# Patient Record
Sex: Female | Born: 1990 | Race: Black or African American | Hispanic: No | Marital: Single | Smoking: Current every day smoker
Health system: Southern US, Community
[De-identification: ages and names within clinical notes are randomized; demographics above are authoritative.]

## PROBLEM LIST (undated history)

## (undated) ENCOUNTER — Inpatient Hospital Stay (HOSPITAL_COMMUNITY): Payer: Self-pay

## (undated) DIAGNOSIS — S82402A Unspecified fracture of shaft of left fibula, initial encounter for closed fracture: Secondary | ICD-10-CM

## (undated) DIAGNOSIS — E669 Obesity, unspecified: Secondary | ICD-10-CM

## (undated) DIAGNOSIS — J45909 Unspecified asthma, uncomplicated: Secondary | ICD-10-CM

## (undated) DIAGNOSIS — F32A Depression, unspecified: Secondary | ICD-10-CM

## (undated) DIAGNOSIS — F419 Anxiety disorder, unspecified: Secondary | ICD-10-CM

## (undated) DIAGNOSIS — K219 Gastro-esophageal reflux disease without esophagitis: Secondary | ICD-10-CM

## (undated) DIAGNOSIS — A749 Chlamydial infection, unspecified: Secondary | ICD-10-CM

## (undated) DIAGNOSIS — W44F3XA Food entering into or through a natural orifice, initial encounter: Secondary | ICD-10-CM

## (undated) DIAGNOSIS — K220 Achalasia of cardia: Secondary | ICD-10-CM

## (undated) DIAGNOSIS — F329 Major depressive disorder, single episode, unspecified: Secondary | ICD-10-CM

## (undated) DIAGNOSIS — T18128A Food in esophagus causing other injury, initial encounter: Secondary | ICD-10-CM

## (undated) HISTORY — DX: Food in esophagus causing other injury, initial encounter: T18.128A

## (undated) HISTORY — DX: Food entering into or through a natural orifice, initial encounter: W44.F3XA

## (undated) HISTORY — DX: Achalasia of cardia: K22.0

---
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
## 2010-11-29 NOTE — Op Note (Signed)
NAME:  Theresa [name], MAK NO.:  [id number]   MEDICAL RECORD NO.:  [id number]          PATIENT TYPE:  AMB   LOCATION:  DSC                          FACILITY:  [facility]   PHYSICIAN:  Christopher E. Ezzard Standing, M.D.DATE OF BIRTH:  September 20, 1990   DATE OF PROCEDURE:  08/06/2007  DATE OF DISCHARGE:                               OPERATIVE REPORT   PREOPERATIVE DIAGNOSIS:  Left tonsil mass with throat and swallowing  discomfort.   POSTOPERATIVE DIAGNOSIS:  Left tonsil mass with throat and swallowing  discomfort.   OPERATION:  Tonsillectomy.   SURGEON:  Dillard Cannon, M.D.   ANESTHESIA:  General endotracheal.   COMPLICATIONS:  None.   CLINICAL NOTE:  Gabriella Woodhead is an 20 year old female who has had a  mass on the left tonsil that has gradually been getting larger over the  last six months.  She has had intermittent discomfort with this.  On  exam, she has what appears to be a large left tonsillar cyst with the  mass extruding to midline level of the uvula.  She is taken to the  operating room at this time for tonsillectomy.   DESCRIPTION OF PROCEDURE:  After adequate endotracheal anesthesia, the  patient received 1 gram Ancef IV preoperatively as well as 10 mg of  Decadron.  A mouth gag was used to expose the oropharynx.  The left  tonsil was grasped with a tenaculum, was excised from the tonsillar  fossa using the cautery.  Care was taken to preserve the anterior and  posterior tonsillar pillars as well as the uvula.  Hemostasis was  obtained with the cautery.  The mass was sent as a specimen to  pathology.  Next the right tonsil was removed from the tonsillar fossa  in similar fashion.  Hemostasis was obtained the with cautery.  This  likewise was sent to pathology.  After obtaining adequate hemostasis,  the oropharynx was irrigated with saline.  Valentina was awoken from  anesthesia and transferred to the recovery room postop doing well.   DISPOSITION:  Aolani is  discharged home later this morning on  amoxicillin suspension 400 mg b.i.d. for 1 week and Tylenol Lortab  elixir one to one and one-half tablespoons q.4h. p.r.n. pain.  Follow-up  in my office in 2 weeks for recheck.           ______________________________  Kristine Garbe. Ezzard Standing, M.D.    CEN/MEDQ  D:  08/06/2007  T:  08/06/2007  Job:  [id number]   cc:   Renaye Rakers, M.D.  Kristine Garbe. Ezzard Standing, M.D.

## 2011-04-07 LAB — POCT HEMOGLOBIN-HEMACUE: Hemoglobin: 13.1

## 2011-12-16 DIAGNOSIS — S82402A Unspecified fracture of shaft of left fibula, initial encounter for closed fracture: Secondary | ICD-10-CM

## 2011-12-16 HISTORY — DX: Unspecified fracture of shaft of left fibula, initial encounter for closed fracture: S82.402A

## 2012-01-13 ENCOUNTER — Emergency Department (HOSPITAL_COMMUNITY)
Admission: EM | Admit: 2012-01-13 | Discharge: 2012-01-13 | Disposition: A | Discharge status: Home / Self Care | Payer: Medicaid - Out of State | Attending: Emergency Medicine | Admitting: Emergency Medicine

## 2012-01-13 ENCOUNTER — Encounter (HOSPITAL_COMMUNITY): Payer: Self-pay | Admitting: *Deleted

## 2012-01-13 ENCOUNTER — Emergency Department (HOSPITAL_COMMUNITY): Payer: Medicaid - Out of State

## 2012-01-13 DIAGNOSIS — J45909 Unspecified asthma, uncomplicated: Secondary | ICD-10-CM | POA: Insufficient documentation

## 2012-01-13 DIAGNOSIS — Y9229 Other specified public building as the place of occurrence of the external cause: Secondary | ICD-10-CM | POA: Insufficient documentation

## 2012-01-13 DIAGNOSIS — F17200 Nicotine dependence, unspecified, uncomplicated: Secondary | ICD-10-CM | POA: Insufficient documentation

## 2012-01-13 DIAGNOSIS — S82839A Other fracture of upper and lower end of unspecified fibula, initial encounter for closed fracture: Secondary | ICD-10-CM

## 2012-01-13 DIAGNOSIS — W010XXA Fall on same level from slipping, tripping and stumbling without subsequent striking against object, initial encounter: Secondary | ICD-10-CM | POA: Insufficient documentation

## 2012-01-13 DIAGNOSIS — S82899A Other fracture of unspecified lower leg, initial encounter for closed fracture: Secondary | ICD-10-CM | POA: Insufficient documentation

## 2012-01-13 HISTORY — DX: Unspecified asthma, uncomplicated: J45.909

## 2012-01-13 MED ORDER — HYDROCODONE-ACETAMINOPHEN 5-325 MG PO TABS
1.0000 | ORAL_TABLET | Freq: Once | ORAL | Status: AC
  Administered 2012-01-13: 1 via ORAL
  Filled 2012-01-13: qty 1

## 2012-01-13 MED ORDER — NAPROXEN 500 MG PO TABS: 500.0000 mg | ORAL_TABLET | Freq: Two times a day (BID) | ORAL | Status: AC

## 2012-01-13 MED ORDER — HYDROCODONE-ACETAMINOPHEN 5-325 MG PO TABS: 1.0000 | ORAL_TABLET | Freq: Four times a day (QID) | ORAL | Status: AC | PRN

## 2012-01-13 NOTE — ED Notes (Signed)
Patient discharge via wheelchair with crutches. Respirations equal and unlabored. Skin warm and dry. No acute distress noted.

## 2012-01-13 NOTE — ED Notes (Signed)
Pt from hotel with reports of tripping over carpet and falling injuring left ankle which is noted to be swollen and bruised. Pt denies LOC.

## 2012-01-13 NOTE — ED Provider Notes (Signed)
History     CSN: [id number]  Arrival date & time 01/13/12  1554   First MD Initiated Contact with Patient 01/13/12 1823      Chief Complaint  Patient presents with  . Fall  . Ankle Injury    left  . Ankle Pain    left    Patient is a 21 y.o. female presenting with fall. The history is provided by the patient.  Fall Incident onset: today. Incident: Pt from hotel with reports of tripping over carpet and falling injuring left ankle. Pain location: left ankle. The pain is moderate. Pertinent negatives include no fever. The symptoms are aggravated by activity and ambulation. She has tried nothing for the symptoms. The treatment provided no relief.  Pt has pain on the outside of her left ankle.  No foot or knee pain.  Past Medical History  Diagnosis Date  . Asthma     Past Surgical History  Procedure Date  . Cesarean section     History reviewed. No pertinent family history.  History  Substance Use Topics  . Smoking status: Current Everyday Smoker -- 0.5 packs/day    Types: Cigarettes  . Smokeless tobacco: Never Used  . Alcohol Use: Yes     weekends    OB History    Grav Para Term Preterm Abortions TAB SAB Ect Mult Living                  Review of Systems  Constitutional: Negative for fever.  Respiratory: Negative for shortness of breath.   Genitourinary: Negative for frequency.    Allergies  Review of patient's allergies indicates no known allergies.  Home Medications   Current Outpatient Rx  Name Route Sig Dispense Refill  . HYDROCODONE-ACETAMINOPHEN 5-325 MG PO TABS Oral Take 1-2 tablets by mouth every 6 (six) hours as needed for pain. 16 tablet 0  . NAPROXEN 500 MG PO TABS Oral Take 1 tablet (500 mg total) by mouth 2 (two) times daily. 30 tablet 0    BP 102/60  Pulse 104  Temp 99 F (37.2 C) (Oral)  Resp 18  Ht 5\' 4"  (1.626 m)  Wt 200 lb (90.719 kg)  BMI 34.33 kg/m2  SpO2 99%  LMP 01/01/2012  Physical Exam  Nursing note and vitals  reviewed. Constitutional: She appears well-developed and well-nourished. No distress.  HENT:  Head: Normocephalic and atraumatic.  Right Ear: External ear normal.  Left Ear: External ear normal.  Eyes: Conjunctivae are normal. Right eye exhibits no discharge. Left eye exhibits no discharge. No scleral icterus.  Neck: Neck supple. No tracheal deviation present.  Cardiovascular: Normal rate.   Pulmonary/Chest: Effort normal. No stridor. No respiratory distress.  Musculoskeletal: She exhibits no edema.       Left knee: Normal.       Left ankle: She exhibits decreased range of motion and swelling. She exhibits no deformity, no laceration and normal pulse. tenderness. Lateral malleolus tenderness found. No medial malleolus, no head of 5th metatarsal and no proximal fibula tenderness found. Achilles tendon normal.  Neurological: She is alert. Cranial nerve deficit: no gross deficits.  Skin: Skin is warm and dry. No rash noted.  Psychiatric: She has a normal mood and affect.    ED Course  Procedures (including critical care time)  Labs Reviewed - No data to display Dg Ankle Complete Left  01/13/2012  *RADIOLOGY REPORT*  Clinical Data: Status post fall  LEFT ANKLE COMPLETE - 3+ VIEW  Comparison: None  Findings:  There is an oblique fracture deformity involving the distal fibula.  Fracture line extends into the ankle joint.  No significant displacement or angulation.  No radiopaque foreign bodies or soft tissue calcifications.  Marked lateral soft tissue swelling is noted.  IMPRESSION:  1.  Acute, oblique, intra-articular fracture involving the distal fibula.  Original Report Authenticated By: Rosealee Albee, M.D.     1. Fracture of distal fibula       MDM  Pt placed in cam walker.  Crutches .  Will dc home with ortho referral.        Celene Kras, MD 01/13/12 [phone]

## 2012-01-13 NOTE — Discharge Instructions (Signed)
Ankle Fracture A fracture is a break in the bone. A cast or splint is used to protect and keep your injured bone from moving.  HOME CARE INSTRUCTIONS   Use your crutches as directed.   To lessen the swelling, keep the injured leg elevated while sitting or lying down.   Apply ice to the injury for 15 to 20 minutes, 3 to 4 times per day while awake for 2 days. Put the ice in a plastic bag and place a thin towel between the bag of ice and your cast.   If you have a plaster or fiberglass cast:   Do not try to scratch the skin under the cast using sharp or pointed objects.   Check the skin around the cast every day. You may put lotion on any red or sore areas.   Keep your cast dry and clean.   If you have a plaster splint:   Wear the splint as directed.   You may loosen the elastic around the splint if your toes become numb, tingle, or turn cold or blue.   Do not put pressure on any part of your cast or splint; it may break. Rest your cast only on a pillow the first 24 hours until it is fully hardened.   Your cast or splint can be protected during bathing with a plastic bag. Do not lower the cast or splint into water.   Take medications as directed by your caregiver. Only take over-the-counter or prescription medicines for pain, discomfort, or fever as directed by your caregiver.   Do not drive a vehicle until your caregiver specifically tells you it is safe to do so.   If your caregiver has given you a follow-up appointment, it is very important to keep that appointment. Not keeping the appointment could result in a chronic or permanent injury, pain, and disability. If there is any problem keeping the appointment, you must call back to this facility for assistance.  SEEK IMMEDIATE MEDICAL CARE IF:   Your cast gets damaged or breaks.   You have continued severe pain or more swelling than you did before the cast was put on.   Your skin or toenails below the injury turn blue or gray,  or feel cold or numb.   There is a bad smell or new stains and/or purulent (pus like) drainage coming from under the cast.  If you do not have a window in your cast for observing the wound, a discharge or minor bleeding may show up as a stain on the outside of your cast. Report these findings to your caregiver. MAKE SURE YOU:   Understand these instructions.   Will watch your condition.   Will get help right away if you are not doing well or get worse.  Document Released: 06/30/2000 Document Revised: 06/22/2011 Document Reviewed: 02/04/2008 [employer] Patient Information 2012 [employer]. 

## 2013-04-25 ENCOUNTER — Emergency Department (HOSPITAL_COMMUNITY)
Admission: EM | Admit: 2013-04-25 | Discharge: 2013-04-25 | Disposition: A | Discharge status: Home / Self Care | Payer: Medicaid - Out of State | Attending: Emergency Medicine | Admitting: Emergency Medicine

## 2013-04-25 ENCOUNTER — Encounter (HOSPITAL_COMMUNITY): Payer: Self-pay | Admitting: Emergency Medicine

## 2013-04-25 DIAGNOSIS — N390 Urinary tract infection, site not specified: Secondary | ICD-10-CM

## 2013-04-25 DIAGNOSIS — F17200 Nicotine dependence, unspecified, uncomplicated: Secondary | ICD-10-CM | POA: Insufficient documentation

## 2013-04-25 DIAGNOSIS — Z3202 Encounter for pregnancy test, result negative: Secondary | ICD-10-CM | POA: Insufficient documentation

## 2013-04-25 DIAGNOSIS — R3989 Other symptoms and signs involving the genitourinary system: Secondary | ICD-10-CM | POA: Insufficient documentation

## 2013-04-25 DIAGNOSIS — J45909 Unspecified asthma, uncomplicated: Secondary | ICD-10-CM | POA: Insufficient documentation

## 2013-04-25 DIAGNOSIS — N938 Other specified abnormal uterine and vaginal bleeding: Secondary | ICD-10-CM | POA: Insufficient documentation

## 2013-04-25 DIAGNOSIS — Z792 Long term (current) use of antibiotics: Secondary | ICD-10-CM | POA: Insufficient documentation

## 2013-04-25 DIAGNOSIS — N949 Unspecified condition associated with female genital organs and menstrual cycle: Secondary | ICD-10-CM | POA: Insufficient documentation

## 2013-04-25 LAB — URINALYSIS, ROUTINE W REFLEX MICROSCOPIC
Ketones, ur: 15 mg/dL — AB
Nitrite: POSITIVE — AB
Protein, ur: NEGATIVE mg/dL

## 2013-04-25 LAB — URINE MICROSCOPIC-ADD ON

## 2013-04-25 MED ORDER — FLUCONAZOLE 150 MG PO TABS: ORAL_TABLET | ORAL | Status: DC

## 2013-04-25 MED ORDER — CEPHALEXIN 500 MG PO CAPS: 500.0000 mg | ORAL_CAPSULE | Freq: Two times a day (BID) | ORAL | Status: DC

## 2013-04-25 NOTE — ED Provider Notes (Signed)
CSN: [id number]     Arrival date & time 04/25/13  1257 History   First MD Initiated Contact with Patient 04/25/13 1344     Chief Complaint  Patient presents with  . Abdominal Pain   (Consider location/radiation/quality/duration/timing/severity/associated sxs/prior Treatment) Patient is a 22 y.o. female presenting with abdominal pain.  Abdominal Pain Associated symptoms: no chest pain, no chills, no fever and no shortness of breath   Patient is a 22 yo female who presents with suprapubic abdominal pain for the past several days in addition to 10 days of menstrual period. States starting 10/1 she started having her period. Has been bright red blood. Initially was using 4 tampons/day. It started to slow on 10/8. She states she typically has regular periods that last 3-4 days and uses 2 tampons per day. She notes suprapubic pain and states this feels like a UTI to her. Endorses tingling with urination and has noted suprapubic pain following intercourse over the past couple days. Notes history of chlamydia at age 66. Is monogamous with her boyfriend. Does not note discharge.  Past Medical History  Diagnosis Date  . Asthma    Past Surgical History  Procedure Laterality Date  . Cesarean section     History reviewed. No pertinent family history. History  Substance Use Topics  . Smoking status: Current Every Day Smoker -- 0.50 packs/day    Types: Cigarettes  . Smokeless tobacco: Never Used  . Alcohol Use: Yes     Comment: weekends   OB History   Grav Para Term Preterm Abortions TAB SAB Ect Mult Living                 Review of Systems  Constitutional: Negative for fever and chills.  Eyes: Negative for visual disturbance.  Respiratory: Negative for chest tightness and shortness of breath.   Cardiovascular: Negative for chest pain and leg swelling.  Gastrointestinal: Positive for abdominal pain (suprapubic).  Genitourinary: Positive for menstrual problem (10 days of bleeding, slowing  at this time). Negative for urgency, difficulty urinating, vaginal pain and pelvic pain.       Tingling with urination  Musculoskeletal: Negative for joint swelling.  All other systems reviewed and are negative.    Allergies  Review of patient's allergies indicates no known allergies.  Home Medications   Current Outpatient Rx  Name  Route  Sig  Dispense  Refill  . cephALEXin (KEFLEX) 500 MG capsule   Oral   Take 1 capsule (500 mg total) by mouth 2 (two) times daily.   14 capsule   0   . fluconazole (DIFLUCAN) 150 MG tablet      One tablet by mouth at first sign of a yeast infection. May repeat after 72 hours for one additional dose.   2 tablet   0    BP 124/65  Pulse 76  Temp(Src) 98.7 F (37.1 C) (Oral)  Resp 18  Ht 5\' 4"  (1.626 m)  Wt 199 lb 6.4 oz (90.447 kg)  BMI 34.21 kg/m2  SpO2 97% Physical Exam  Constitutional: She appears well-developed and well-nourished. No distress.  HENT:  Head: Normocephalic and atraumatic.  Mouth/Throat: Oropharynx is clear and moist.  Eyes: Conjunctivae are normal. Pupils are equal, round, and reactive to light.  Neck: Neck supple.  Cardiovascular: Normal rate and regular rhythm.   Pulmonary/Chest: Effort normal and breath sounds normal.  Abdominal: Soft. Bowel sounds are normal. She exhibits no distension. There is tenderness (suprapubic). There is no rebound and no guarding.  Genitourinary:  External vagina normal in appearance, bimanual exam without masses or adnexal/cervical tenderness, no blood noted on gloves after bimanual, mirena strings felt on bimanual exam    ED Course  Procedures (including critical care time) Labs Review Labs Reviewed  URINALYSIS, ROUTINE W REFLEX MICROSCOPIC - Abnormal; Notable for the following:    APPearance CLOUDY (*)    Specific Gravity, Urine 1.031 (*)    Ketones, ur 15 (*)    Nitrite POSITIVE (*)    Leukocytes, UA MODERATE (*)    All other components within normal limits  URINE  MICROSCOPIC-ADD ON - Abnormal; Notable for the following:    Squamous Epithelial / LPF FEW (*)    Bacteria, UA MANY (*)    All other components within normal limits  URINE CULTURE  POCT PREGNANCY, URINE   Imaging Review No results found.  EKG Interpretation   None       MDM   1. UTI (lower urinary tract infection)    Patient with suprapubic abdominal pain worse with urination. Additionally with prolonged (10 days) menstrual bleeding in the setting of mirena IUD. UA revealed + nitrites and moderate leuks. Bimanual exam did not reveal any adnexal or cervical motion tenderness, making the UTI the likely cause of her suprapubic abdominal pain. Her prolonged menstrual bleeding has been improving for the past 2 days and is likely related to her mirena IUD. I discussed this with the patient and she acknowledged understanding of this. Advised that if menstrual irregularities continue she should follow-up with a PCP. She was given a prescription for keflex 500 mg BID for 7 days and diflucan 150 mg once with option to repeat after 72 hours. Given return precautions and given resource guide for PCP follow-up. I discussed this patient with my attending Dr Radford Pax.  Marikay Alar, MD [facility] PGY-2 04/25/13 2:50 pm    Glori Luis, MD 04/25/13 1500

## 2013-04-25 NOTE — ED Notes (Addendum)
GYN. Vaginal bleeding since 10/1 (bright red, small). Prior periods normal. Uterine implant (Mirena) in place since 04/2012. Hx of previous implants. PT endorses "pinching feeling after intercourse and movement". Urinary frequency, no other urinary Sx. BM normal. NO other vaginal discharge

## 2013-04-26 NOTE — ED Provider Notes (Signed)
I saw and evaluated the patient, reviewed the resident's note and I agree with the findings and plan.   .Face to face Exam:  General:  Awake HEENT:  Atraumatic Resp:  Normal effort Abd:  Nondistended Neuro:No focal weakness  Nelia Shi, MD 04/26/13 1118

## 2013-04-27 LAB — URINE CULTURE: Colony Count: >=100000

## 2013-07-17 DIAGNOSIS — E669 Obesity, unspecified: Secondary | ICD-10-CM | POA: Insufficient documentation

## 2013-07-17 HISTORY — DX: Obesity, unspecified: E66.9

## 2013-08-05 ENCOUNTER — Encounter (HOSPITAL_COMMUNITY): Payer: Self-pay | Admitting: Emergency Medicine

## 2013-08-05 DIAGNOSIS — J45909 Unspecified asthma, uncomplicated: Secondary | ICD-10-CM | POA: Diagnosis present

## 2013-08-05 DIAGNOSIS — E876 Hypokalemia: Secondary | ICD-10-CM | POA: Diagnosis present

## 2013-08-05 DIAGNOSIS — E43 Unspecified severe protein-calorie malnutrition: Secondary | ICD-10-CM | POA: Diagnosis present

## 2013-08-05 DIAGNOSIS — K219 Gastro-esophageal reflux disease without esophagitis: Secondary | ICD-10-CM | POA: Diagnosis present

## 2013-08-05 DIAGNOSIS — F17200 Nicotine dependence, unspecified, uncomplicated: Secondary | ICD-10-CM | POA: Diagnosis present

## 2013-08-05 DIAGNOSIS — K220 Achalasia of cardia: Secondary | ICD-10-CM | POA: Diagnosis present

## 2013-08-05 DIAGNOSIS — R1310 Dysphagia, unspecified: Principal | ICD-10-CM | POA: Diagnosis present

## 2013-08-05 DIAGNOSIS — E860 Dehydration: Secondary | ICD-10-CM | POA: Diagnosis present

## 2013-08-05 DIAGNOSIS — Z6830 Body mass index (BMI) 30.0-30.9, adult: Secondary | ICD-10-CM

## 2013-08-05 LAB — CBC
HEMATOCRIT: 42.5 % (ref 36.0–46.0)
Hemoglobin: 14.8 g/dL (ref 12.0–15.0)
MCH: 30.6 pg (ref 26.0–34.0)
MCHC: 34.8 g/dL (ref 30.0–36.0)
MCV: 87.8 fL (ref 78.0–100.0)
PLATELETS: 212 10*3/uL (ref 150–400)
RBC: 4.84 MIL/uL (ref 3.87–5.11)
RDW: 12.9 % (ref 11.5–15.5)
WBC: 7.9 10*3/uL (ref 4.0–10.5)

## 2013-08-05 LAB — BASIC METABOLIC PANEL
BUN: 7 mg/dL (ref 6–23)
CHLORIDE: 105 meq/L (ref 96–112)
CO2: 22 meq/L (ref 19–32)
Calcium: 9.3 mg/dL (ref 8.4–10.5)
Creatinine, Ser: 0.73 mg/dL (ref 0.50–1.10)
GFR calc non Af Amer: >90 mL/min (ref >90)
Glucose, Bld: 96 mg/dL (ref 70–99)
POTASSIUM: 4.3 meq/L (ref 3.7–5.3)
Sodium: 142 mEq/L (ref 137–147)

## 2013-08-05 LAB — POCT PREGNANCY, URINE: PREG TEST UR: NEGATIVE

## 2013-08-05 MED ORDER — ONDANSETRON 4 MG PO TBDP
4.0000 mg | ORAL_TABLET | Freq: Once | ORAL | Status: AC
  Administered 2013-08-05: 4 mg via ORAL
  Filled 2013-08-05: qty 1

## 2013-08-05 NOTE — ED Notes (Signed)
Pt is here with discomfort to mid epigastric pain and states that when she eats it comes up.  Pt here to r/o reflux

## 2013-08-06 ENCOUNTER — Observation Stay (HOSPITAL_COMMUNITY): Payer: Medicaid - Out of State

## 2013-08-06 ENCOUNTER — Encounter (HOSPITAL_COMMUNITY): Payer: Self-pay | Admitting: Internal Medicine

## 2013-08-06 ENCOUNTER — Encounter (HOSPITAL_COMMUNITY)
Admission: EM | Disposition: A | Discharge status: Home / Self Care | Payer: Self-pay | Source: Home / Self Care | Attending: Internal Medicine

## 2013-08-06 ENCOUNTER — Inpatient Hospital Stay (HOSPITAL_COMMUNITY)
Admission: EM | Admit: 2013-08-06 | Discharge: 2013-08-07 | DRG: 391 | Disposition: A | Discharge status: Home / Self Care | Payer: Medicaid - Out of State | Attending: Internal Medicine | Admitting: Internal Medicine

## 2013-08-06 DIAGNOSIS — F17200 Nicotine dependence, unspecified, uncomplicated: Secondary | ICD-10-CM

## 2013-08-06 DIAGNOSIS — Z720 Tobacco use: Secondary | ICD-10-CM | POA: Diagnosis present

## 2013-08-06 DIAGNOSIS — R1310 Dysphagia, unspecified: Principal | ICD-10-CM

## 2013-08-06 DIAGNOSIS — E876 Hypokalemia: Secondary | ICD-10-CM

## 2013-08-06 DIAGNOSIS — E43 Unspecified severe protein-calorie malnutrition: Secondary | ICD-10-CM

## 2013-08-06 DIAGNOSIS — R1110 Vomiting, unspecified: Secondary | ICD-10-CM | POA: Diagnosis present

## 2013-08-06 DIAGNOSIS — R1013 Epigastric pain: Secondary | ICD-10-CM

## 2013-08-06 DIAGNOSIS — K220 Achalasia of cardia: Secondary | ICD-10-CM

## 2013-08-06 HISTORY — DX: Obesity, unspecified: E66.9

## 2013-08-06 HISTORY — DX: Chlamydial infection, unspecified: A74.9

## 2013-08-06 HISTORY — DX: Unspecified fracture of shaft of left fibula, initial encounter for closed fracture: S82.402A

## 2013-08-06 HISTORY — PX: ESOPHAGOGASTRODUODENOSCOPY (EGD) WITH ESOPHAGEAL DILATION: SHX5812

## 2013-08-06 LAB — GLUCOSE, CAPILLARY
GLUCOSE-CAPILLARY: 90 mg/dL (ref 70–99)
GLUCOSE-CAPILLARY: 61 mg/dL — AB (ref 70–99)
GLUCOSE-CAPILLARY: 112 mg/dL — AB (ref 70–99)
Glucose-Capillary: 105 mg/dL — ABNORMAL HIGH (ref 70–99)
Glucose-Capillary: 66 mg/dL — ABNORMAL LOW (ref 70–99)
Glucose-Capillary: 80 mg/dL (ref 70–99)

## 2013-08-06 SURGERY — EGD (ESOPHAGOGASTRODUODENOSCOPY)
Anesthesia: Moderate Sedation

## 2013-08-06 SURGERY — ESOPHAGOGASTRODUODENOSCOPY (EGD) WITH ESOPHAGEAL DILATION
Anesthesia: Moderate Sedation

## 2013-08-06 MED ORDER — DEXTROSE-NACL 5-0.9 % IV SOLN
INTRAVENOUS | Status: DC
  Administered 2013-08-06 – 2013-08-07 (×2): via INTRAVENOUS

## 2013-08-06 MED ORDER — PANTOPRAZOLE SODIUM 40 MG IV SOLR
40.0000 mg | INTRAVENOUS | Status: DC
  Administered 2013-08-06 – 2013-08-07 (×2): 40 mg via INTRAVENOUS
  Filled 2013-08-06 (×3): qty 40

## 2013-08-06 MED ORDER — PANTOPRAZOLE SODIUM 40 MG IV SOLR
40.0000 mg | Freq: Once | INTRAVENOUS | Status: AC
  Administered 2013-08-06: 40 mg via INTRAVENOUS
  Filled 2013-08-06: qty 40

## 2013-08-06 MED ORDER — DIPHENHYDRAMINE HCL 50 MG/ML IJ SOLN
INTRAMUSCULAR | Status: DC | PRN
  Administered 2013-08-06: 25 mg via INTRAVENOUS

## 2013-08-06 MED ORDER — WHITE PETROLATUM GEL
Status: AC
  Filled 2013-08-06: qty 5

## 2013-08-06 MED ORDER — MIDAZOLAM HCL 5 MG/ML IJ SOLN
INTRAMUSCULAR | Status: AC
  Filled 2013-08-06: qty 2

## 2013-08-06 MED ORDER — MIDAZOLAM HCL 10 MG/2ML IJ SOLN
INTRAMUSCULAR | Status: DC | PRN
  Administered 2013-08-06 (×2): 2 mg via INTRAVENOUS
  Administered 2013-08-06: 1 mg via INTRAVENOUS
  Administered 2013-08-06: 2 mg via INTRAVENOUS

## 2013-08-06 MED ORDER — ZOLPIDEM TARTRATE 5 MG PO TABS
5.0000 mg | ORAL_TABLET | Freq: Once | ORAL | Status: AC
  Administered 2013-08-06: 5 mg via ORAL
  Filled 2013-08-06: qty 1

## 2013-08-06 MED ORDER — SODIUM CHLORIDE 0.9 % IV SOLN: INTRAVENOUS | Status: DC

## 2013-08-06 MED ORDER — DEXTROSE 50 % IV SOLN
INTRAVENOUS | Status: AC
  Filled 2013-08-06: qty 50

## 2013-08-06 MED ORDER — ACETAMINOPHEN 325 MG PO TABS
650.0000 mg | ORAL_TABLET | Freq: Four times a day (QID) | ORAL | Status: DC | PRN
  Administered 2013-08-07: 650 mg via ORAL
  Filled 2013-08-06: qty 2

## 2013-08-06 MED ORDER — FENTANYL CITRATE 0.05 MG/ML IJ SOLN
INTRAMUSCULAR | Status: DC | PRN
  Administered 2013-08-06 (×4): 25 ug via INTRAVENOUS

## 2013-08-06 MED ORDER — ONDANSETRON HCL 4 MG PO TABS: 4.0000 mg | ORAL_TABLET | Freq: Four times a day (QID) | ORAL | Status: DC | PRN

## 2013-08-06 MED ORDER — ONDANSETRON HCL 4 MG/2ML IJ SOLN: 4.0000 mg | Freq: Three times a day (TID) | INTRAMUSCULAR | Status: DC | PRN

## 2013-08-06 MED ORDER — FAMOTIDINE 20 MG PO TABS
20.0000 mg | ORAL_TABLET | Freq: Once | ORAL | Status: AC
  Administered 2013-08-06: 20 mg via ORAL
  Filled 2013-08-06: qty 1

## 2013-08-06 MED ORDER — DEXTROSE 50 % IV SOLN
25.0000 mL | Freq: Once | INTRAVENOUS | Status: AC | PRN
  Administered 2013-08-06: 25 mL via INTRAVENOUS

## 2013-08-06 MED ORDER — FENTANYL CITRATE 0.05 MG/ML IJ SOLN
INTRAMUSCULAR | Status: AC
  Filled 2013-08-06: qty 2

## 2013-08-06 MED ORDER — ACETAMINOPHEN 650 MG RE SUPP: 650.0000 mg | Freq: Four times a day (QID) | RECTAL | Status: DC | PRN

## 2013-08-06 MED ORDER — GI COCKTAIL ~~LOC~~
30.0000 mL | Freq: Once | ORAL | Status: AC
  Administered 2013-08-06: 30 mL via ORAL
  Filled 2013-08-06: qty 30

## 2013-08-06 MED ORDER — NIFEDIPINE 10 MG PO CAPS
10.0000 mg | ORAL_CAPSULE | Freq: Three times a day (TID) | ORAL | Status: DC
  Administered 2013-08-06 – 2013-08-07 (×3): 10 mg via ORAL
  Filled 2013-08-06 (×6): qty 1

## 2013-08-06 MED ORDER — SODIUM CHLORIDE 0.9 % IV BOLUS (SEPSIS)
1000.0000 mL | Freq: Once | INTRAVENOUS | Status: AC
  Administered 2013-08-06: 1000 mL via INTRAVENOUS

## 2013-08-06 MED ORDER — SODIUM CHLORIDE 0.9 % IV SOLN
INTRAVENOUS | Status: DC
  Administered 2013-08-06: 16:00:00 via INTRAVENOUS

## 2013-08-06 MED ORDER — DIPHENHYDRAMINE HCL 50 MG/ML IJ SOLN
INTRAMUSCULAR | Status: AC
Start: 2013-08-06 — End: 2013-08-06
  Filled 2013-08-06: qty 1

## 2013-08-06 MED ORDER — ONDANSETRON HCL 4 MG/2ML IJ SOLN
4.0000 mg | Freq: Four times a day (QID) | INTRAMUSCULAR | Status: DC | PRN
Start: 2013-08-06 — End: 2013-08-07

## 2013-08-06 MED ORDER — BUTAMBEN-TETRACAINE-BENZOCAINE 2-2-14 % EX AERO
INHALATION_SPRAY | CUTANEOUS | Status: DC | PRN
  Administered 2013-08-06: 2 via TOPICAL

## 2013-08-06 MED ORDER — SODIUM CHLORIDE 0.9 % IV SOLN
INTRAVENOUS | Status: DC
  Administered 2013-08-06: 06:00:00 via INTRAVENOUS

## 2013-08-06 NOTE — ED Notes (Signed)
MD at BS

## 2013-08-06 NOTE — H&P (Signed)
[facility] History and Physical  Theresa HerbertQuashay [name] ZOX:[id number] DOB: Dec 21, 1990 DOA: 08/06/2013  Referring physician: ER physician. PCP: Provider Not In System   Chief Complaint: Difficulty swallowing.  HPI: Theresa [name] is a 23 y.o. female history of tobacco abuse presented to the ER because of increasing pain on swallowing. Patient has been having gradually worsening pain on swallowing over the last 4 months with 15 pound weight loss. Patient initially had difficulty swallowing solid foods and last few weeks has been having difficulty both solid and liquids. Patient states that after she eats she feels her food gets stuck in the chest pain and she eventually has to vomit up. Denies any fever chills or blood in the vomitus he denies abdominal pain diarrhea. ER physician had discussed with on-call gastroenterologist and patient will be admitted for possible EGD and further workup. Patient otherwise denies any chest pain shortness of breath. Patient has been trying to take over-the-counter Prilosec last couple of weeks.  Review of Systems: As presented in the history of presenting illness, rest negative.  Past Medical History  Diagnosis Date  . Asthma    Past Surgical History  Procedure Laterality Date  . Cesarean section     Social History:  reports that she has been smoking Cigarettes.  She has been smoking about 0.50 packs per day. She has never used smokeless tobacco. She reports that she drinks alcohol. She reports that she does not use illicit drugs. Where does patient live home. Can patient participate in ADLs? Yes.  No Known Allergies  Family History:  Family History  Problem Relation Age of Onset  . Hypertension Other       Prior to Admission medications   Medication Sig Start Date End Date Taking? Authorizing Provider  omeprazole (PRILOSEC) 20 MG capsule Take 20 mg by mouth daily as needed (for acid reflux).   Yes Historical Provider, MD    Physical  Exam: Filed Vitals:   08/06/13 0200 08/06/13 0248 08/06/13 0440 08/06/13 0520  BP: 98/61 128/86 112/65 120/45  Pulse: 61 86 55 55  Temp:      TempSrc:      Resp: 24 26 18 17   SpO2: 100% 100% 99% 99%     General:  Well-developed and nourished.  Eyes: Anicteric no pallor.  ENT: No discharge from ears eyes nose mouth.  Neck: No mass felt.  Cardiovascular: S1-S2 heard.  Respiratory: No rhonchi or crepitations.  Abdomen: Soft nontender bowel sounds present.  Skin: No rash.  Musculoskeletal: No edema.  Psychiatric: Appears normal.  Neurologic: Alert awake oriented to time place and person. Moves all extremities.  Labs on Admission:  Basic Metabolic Panel:  Recent Labs Lab 08/05/13 1734  NA 142  K 4.3  CL 105  CO2 22  GLUCOSE 96  BUN 7  CREATININE 0.73  CALCIUM 9.3   Liver Function Tests: No results found for this basename: AST, ALT, ALKPHOS, BILITOT, PROT, ALBUMIN,  in the last 168 hours No results found for this basename: LIPASE, AMYLASE,  in the last 168 hours No results found for this basename: AMMONIA,  in the last 168 hours CBC:  Recent Labs Lab 08/05/13 1734  WBC 7.9  HGB 14.8  HCT 42.5  MCV 87.8  PLT 212   Cardiac Enzymes: No results found for this basename: CKTOTAL, CKMB, CKMBINDEX, TROPONINI,  in the last 168 hours  BNP (last 3 results) No results found for this basename: PROBNP,  in the last 8760 hours CBG: No results found  for this basename: GLUCAP,  in the last 168 hours  Radiological Exams on Admission: Dg Chest Port 1 View  08/06/2013   CLINICAL DATA:  Difficulty swallowing.  EXAM: PORTABLE CHEST - 1 VIEW  COMPARISON:  None.  FINDINGS: The lungs are well-aerated and clear. There is no evidence of focal opacification, pleural effusion or pneumothorax.  The cardiomediastinal silhouette is within normal limits. No acute osseous abnormalities are seen.  IMPRESSION: No acute cardiopulmonary process seen.   Electronically Signed   By:  Roanna Raider M.D.   On: 08/06/2013 05:19     Assessment/Plan Principal Problem:   Dysphagia Active Problems:   Vomiting   Tobacco abuse   1. Dysphagia - patient has dysphagia to both solids and liquids and has lost 14 pounds over the last 4 months. At this time in anticipation of possible procedure I have placed patient n.p.o. and place patient on Protonix IV. Check LFTs. On-call gastroenterologist at patient information as per the ER physician and we will follow further recommendations per gastroenterologist. Continue gentle hydration. 2. Tobacco abuse - patient advised to quit smoking.    Code Status: Full code.  Family Communication: None.  Disposition Plan: Admit for observation.    Theresa [name] N. [facility] Pager [phone].  If 7PM-7AM, please contact night-coverage [email or web address] Password [facility] 08/06/2013, 5:26 AM

## 2013-08-06 NOTE — ED Notes (Signed)
Pt states she feels dehydrated and very hungry however she is unable to eat or drink because it gets caught in her throat and she throws it up. Pt reports this has been ongoing for 3 months, pt states it has become progressively worse.

## 2013-08-06 NOTE — Progress Notes (Signed)
INITIAL NUTRITION ASSESSMENT  Pt meets criteria for SEVERE MALNUTRITION in the context of acute illness or injury as evidenced by energy intake </= 75% for >/= 1 month and weight loss >7.5% in 3 months.  DOCUMENTATION CODES Per approved criteria  -Severe malnutrition in the context of acute illness or injury -Obesity Unspecified   INTERVENTION: 1. Diet advancement at the discretion of the health care team.  2. Will monitor for need of oral nutrition supplements once diet advances.  3. Recommend monitoring potassium, magnesium, and phosphorous lab values as diet advances as pt is at potential risk for refeeding syndrome given very poor oral intake.  4. RD to continue to follow the nutrition care process.  NUTRITION DIAGNOSIS: Inadequate oral intake related to altered GI function as evidenced by dietary recall and weight loss.   Goal: Pt to meet >/= 90% of their estimated needs.  Monitor:  Weight trends, input/output  Reason for Assessment: Pt was identified as at nutrition risk by the malnutrition screening tool.  23 y.o. female  Admitting Dx: Dysphagia  ASSESSMENT: Pt has significant hx of tobacco abuse and was admitted for increase in pain on swallowing. Pt reports this pain has started in August 2014 and since then has been getting progressively worse where she would vomit mostly anything she would eat and sometimes what she would drink. Pt reports a feeling of food getting stuck in chest and then feeling of sickness and vomiting shortly after. She reports that after about 2 bites of food she would immediately feel sick and vomit. She reports that even smoothies, juice, and poweraid would cause her to vomit. Pt reports that vomit would be bile-like with no significant observation of food. Pt reports the feeling of sickness would worsen when laying down. Pt is unable to ate throughout the day due to not able to keep down any food and most liquids. Pt reports significant weight loss  from August 2014. Pt observed to have no significant signs of fat and muscle mass depletion.   Pt is currently NPO status. Endoscopy evaluation is pending; plan to evaluate upper GI tract.   Height: Ht Readings from Last 1 Encounters:  08/06/13 5\' 4"  (1.626 m)    Weight: Wt Readings from Last 1 Encounters:  08/06/13 178 lb 12.7 oz (81.1 kg)    Ideal Body Weight: 120 lbs  % Ideal Body Weight: 148%  Wt Readings from Last 10 Encounters:  08/06/13 178 lb 12.7 oz (81.1 kg)  04/25/13 199 lb 6.4 oz (90.447 kg)  01/13/12 200 lb (90.719 kg)    Usual Body Weight: 199 lb  % Usual Body Weight: 89%  BMI:  Body mass index is 30.67 kg/(m^2). Obese class 1  Estimated Nutritional Needs: Kcal: 1800-2000 kcals Protein: 65-80 grams Fluid: 1.8L-2L/day  Skin: no issues noted  Diet Order: NPO  EDUCATION NEEDS: -Education not appropriate at this time   Intake/Output Summary (Last 24 hours) at 08/06/13 1308 Last data filed at 08/06/13 0523  Gross per 24 hour  Intake   1000 ml  Output      0 ml  Net   1000 ml    Last BM: 1/19   Labs:   Recent Labs Lab 08/05/13 1734  NA 142  K 4.3  CL 105  CO2 22  BUN 7  CREATININE 0.73  CALCIUM 9.3  GLUCOSE 96    CBG (last 3)   Recent Labs  08/06/13 0606 08/06/13 0659 08/06/13 1149  GLUCAP 66* 105* 90  Scheduled Meds: . pantoprazole (PROTONIX) IV  40 mg Intravenous Q24H    Continuous Infusions: . dextrose 5 % and 0.9% NaCl 100 mL/hr at 08/06/13 1610    Past Medical History  Diagnosis Date  . Asthma     Past Surgical History  Procedure Laterality Date  . Cesarean section    . Tonsillectomy  07/2007    Dr Thayer Ohm Lurena Nida Dietetic Intern Pager: [phone]  I agree with the above information and made appropriate revisions. Jarold Motto MS, RD, LDN Pager: [phone] After-hours pager: [phone]

## 2013-08-06 NOTE — Progress Notes (Addendum)
Pt did not keep down dinner. Pt states "I didn't throw up the food, it felt like the acid was just sitting in my throat and I spit it up." No complaints of nausea at this time.

## 2013-08-06 NOTE — ED Notes (Signed)
Admitting MD at BS.  

## 2013-08-06 NOTE — Progress Notes (Signed)
UR completed. Patient changed to inpatient- requiring IVF@ 100cc/hr  

## 2013-08-06 NOTE — ED Notes (Signed)
Pt vomited after receiving GI cocktail and a sip of water.

## 2013-08-06 NOTE — H&P (Signed)
[facility] PROGRESS NOTE  Theresa HerbertQuashay [name] WUJ:[id number] DOB: 05-01-1991 DOA: 08/06/2013 PCP: Provider Not In System  PATIENT IS SEEN EXAMINED; ASSESSMENT PLAN AS PER h&p; PEND GI EVAL.    Theresa SheetsBURIEV, Theresa [name] N  [facility] Pager [phone]. If 7PM-7AM, please contact night-coverage at [email or web address], password [email or web address] 08/06/2013, 1:08 PM  LOS: 0 days

## 2013-08-06 NOTE — Op Note (Signed)
[facility] [address] [place], [id number]   ENDOSCOPY PROCEDURE REPORT  PATIENT: Theresa [name], Theresa  MR#: [id number] BIRTHDATE: [birth year] , 22  yrs. old GENDER: Female ENDOSCOPIST: Hart Carwinora M Brodie, MD REFERRED BY:  Dr Hospitalist PROCEDURE DATE:  08/06/2013 PROCEDURE:  EGD, diagnostic and Savary dilation of esophagus ASA CLASS:     Class II INDICATIONS:  progressive solid food dysphagia of several months duration.  Weight loss of 15 pounds.  Regurgitation of the food.Marland Kitchen. MEDICATIONS: These medications were titrated to patient response per physician's verbal order, Fentanyl 100 mcg IV, Versed 7 mg IV, and Benadryl 25 mg IV TOPICAL ANESTHETIC: Cetacaine Spray  DESCRIPTION OF PROCEDURE: After the risks benefits and alternatives of the procedure were thoroughly explained, informed consent was obtained.  The Pentax Gastroscope [id number] endoscope was introduced through the mouth and advanced to the second portion of the duodenum. Without limitations.  The instrument was slowly withdrawn as the mucosa was fully examined.      Esophagus: esophagus was filled with whitish and clear mucus and saliva which was easily suctioned out. The lumen was mildly dilated but there was no retained food. Peristalsis was present but weak. There was a lower esophageal sphincter spasm.Lumen was was closed endoscope could not traverse into the stomach until I exerted significant amount of pressure and then  it popped in the stomach easily. The endoscope was pulled back into the esophagus repeatedly and every time it was pushed into the stomach and pressure had to be exerted for it to pass , this was consistent with achalasia. There was no tumor or hernia or stricture[        The scope was then withdrawn from the patient and the procedure completed. eyebrow was then placed into the gastric antrum and Savary dilator 20 millimeters passed without difficulty there was no blood on  the dilator  COMPLICATIONS: There were no complications. ENDOSCOPIC IMPRESSION: hypertensive lower esophageal sphincter consistent with achalasia. This was dilation with a 20 mm Savary dilator. Retained secretions in esophagus for decreased peristalsis RECOMMENDATIONS:  barium esophagram in the morning Begin Procardia sublingually 10 mg before each meal Eventually need trial of Botox injection or a Heller myotomy. She will follow up with me in the office May resume regular diet REPEAT EXAM: 1 year for cancer screening   eSigned:  Hart Carwinora M Brodie, MD 08/06/2013 5:05 PM   CC:  PATIENT NAME:  Theresa [name], Theresa MR#: [id number]

## 2013-08-06 NOTE — Consult Note (Signed)
[facility] Consult: 2:03 PM 08/06/2013  LOS: 0 days    Referring Provider: York Spaniel MD Primary Care Physician:  Melene Plan to ED for all medical issues.  Primary Gastroenterologist:  unassigned    Reason for Consultation:  Dysphagia   HPI: Theresa [name] is a 23 y.o. female.   4 months hx dysphagia, did not improve despite using OTC Prilosec for last 2 weeks.  She reports 15 # weight loss though she is still obese at 178 #/BMI 30.   Initially began with solids at beginning of meals.  Would resolve if she drank liquids.  Progressed to solids and liquids occurring whenever she swallowed.  For 2 to 4 weeks regurgitating all po's. Neither Prilosec, antacids have helped.  There is pain/discomfort when food lodges at level of GE junction, but description is not that of primary odynophagia. No previous or current dyspepsia or sxs c/w GERD.  Stable qod BMs.  No blood ever comes up with food.   BMET, CBC, chest films all unrevealing and WNL.     Past Medical History  Diagnosis Date  . Asthma   . Left fibular fracture 12/2011    referred by ED to outpt ortho. non-surgical mgt.   . Chlamydia age 46  . Obesity (BMI 30-39.9) 07/2013    Past Surgical History  Procedure Laterality Date  . Cesarean section  12/2008  . Tonsillectomy  07/2007    Dr Narda Bonds    Prior to Admission medications   Medication Sig Start Date End Date Taking? Authorizing Provider  omeprazole (PRILOSEC) 20 MG capsule Take 20 mg by mouth daily as needed (for acid reflux).   Yes Historical Provider, MD    Scheduled Meds: . pantoprazole (PROTONIX) IV  40 mg Intravenous Q24H   Infusions: . dextrose 5 % and 0.9% NaCl 100 mL/hr at 08/06/13 0634   PRN Meds: acetaminophen, acetaminophen, ondansetron (ZOFRAN) IV, ondansetron   Allergies as of  08/05/2013  . (No Known Allergies)    Family History  Problem Relation Age of Onset  . Hypertension Other     History   Social History  . Marital Status: Single    Spouse Name: N/A    Number of Children: 1  . Years of Education: Some college   Occupational History  . Works at a [employer].  Involves some heavy lifting   Social History Main Topics  . Smoking status: Current Every Day Smoker -- 0.50 packs/day    Types: Cigarettes  . Smokeless tobacco: Never Used  . Alcohol Use: Yes     Comment: weekends  . Drug Use: No  . Sexual Activity: Not on file   Other Topics Concern  . Not on file   Social History Narrative  . 67 year old child as of 07/2008    REVIEW OF SYSTEMS: Constitutional:  Weight loss, some fatigue but not profound ENT:  No nose bleeds Pulm:  No SOB or cough CV:  No palpitations, no LE edema.  GU:  No hematuria, no frequency GI:  Per HPI Heme:  No hx anemia   Transfusions:  none Neuro:  No headaches, no peripheral tingling or numbness Derm:  No itching, no rash or sores.  tatoos date from age 38 to 50 y/o Endocrine:  No sweats or chills.  No polyuria or dysuria Immunization:  Not queried.  Travel:  None beyond local counties in last few months.    PHYSICAL EXAM: Vital signs in last 24 hours: Filed Vitals:   08/06/13 0555  BP: 118/66  Pulse: 56  Temp: 98.3 F (36.8 C)  Resp: 17   Wt Readings from Last 3 Encounters:  08/06/13 81.1 kg (178 lb 12.7 oz)  04/25/13 90.447 kg (199 lb 6.4 oz)  01/13/12 90.719 kg (200 lb)   General: looks well, not obese appearing Head:  No trauma or swelling.  No asymmetry  Eyes:  No icterus or pallor Ears:  Not HOH  Nose:  No discharge or congestion.  Mouth:  Clear, moist, good dentition Neck:  No mass, no goiter.  Not tender.  Lungs:  Clear bil.  No cough or dyspnea Heart: RRR Abdomen:  Soft, ND, no mass or HSM.  Active BS. Not obese.  Slight tenderness in LUQ Rectal:  deferred Musc/Skeltl: no joint swelling or deformity Extremities:  No CCE.  Feet warm and well perfused  Neurologic:  No tremor, oriented x 3.  No tremor or limb weakness.  No gross deficits Skin:  No sores or rash Tattoos:  Several professional grade tatoos on trunk and limbs Nodes:  No cervical adenopathy.    Psych:  Pleasant, cooperative, high energy.   Intake/Output from previous day: 01/20 0701 - 01/21 0700 In: 1000 [I.V.:1000] Out: -  Intake/Output this shift:    LAB RESULTS:  Recent Labs  08/05/13 1734  WBC 7.9  HGB 14.8  HCT 42.5  PLT 212   BMET Lab Results  Component Value Date   NA 142 08/05/2013   K 4.3 08/05/2013   CL 105 08/05/2013   CO2 22 08/05/2013   GLUCOSE 96 08/05/2013   BUN 7 08/05/2013   CREATININE 0.73 08/05/2013   CALCIUM 9.3 08/05/2013   LFT No results found for this basename: PROT, ALBUMIN, AST, ALT, ALKPHOS, BILITOT, BILIDIR, IBILI,  in the last 72 hours PT/INR No results found for this basename: INR,  PROTIME   Hepatitis Panel No results found for this basename: HEPBSAG, HCVAB, HEPAIGM, HEPBIGM,  in the last 72 hours C-Diff No components found with this basename: cdiff   Lipase  No results found for this basename: lipase    Drugs of Abuse  No results found for this basename: labopia,  cocainscrnur,  labbenz,  amphetmu,  thcu,  labbarb     RADIOLOGY STUDIES: Dg Chest Port 1 View  08/06/2013   CLINICAL DATA:  Difficulty swallowing.  EXAM: PORTABLE CHEST - 1 VIEW  COMPARISON:  None.  FINDINGS: The lungs are well-aerated and clear. There is no evidence of focal opacification, pleural effusion or pneumothorax.  The cardiomediastinal silhouette is within normal limits. No acute osseous abnormalities are seen.  IMPRESSION: No acute cardiopulmonary process seen.   Electronically Signed   By: Roanna Raider M.D.   On: 08/06/2013 05:19    ENDOSCOPIC STUDIES: None ever  IMPRESSION:   *  Dysphagia. Progressive over 4 months, with significant  weight loss.  Rule out stricture at GE junction.  She is quite young to be suffering from esophageal dysmotility or to have esophageal tumor. Pt otherwise healthy.     PLAN:     *  EGD. This afternoon, late. Keep npo    Jennye MoccasinSarah Gribbin  08/06/2013, 2:03 PM Pager: [phone] Attending MD note:   I have taken a history, examined the patient, and reviewed the chart. I agree with the Advanced Practitioner's impression and recommendations. Solid food dysphagia of recent onset. Rule out esophageal stricture when or fibers ring. Doubt malignancy or motility problem. For upper endoscopy this afternoon  Willa Roughora Maryori Weide,MD [facility] Pager # [phone]

## 2013-08-06 NOTE — ED Notes (Signed)
Pt given apple juice and water 

## 2013-08-06 NOTE — ED Notes (Signed)
Pt's BP read 97/44 when pt lays on her right side, retook pt's BP while pt was lying on her back and BP read 120/45.

## 2013-08-06 NOTE — Progress Notes (Signed)
Pt admitted to unit at 0540. Pt alert, oriented, and pleasant, in no apparent distress. Skin intact. IV intact. Pt oriented to room, call bell within reach. Will continue to monitor pt per MD orders.

## 2013-08-06 NOTE — Progress Notes (Signed)
Hypoglycemic Event  CBG: 66  Treatment: D50 IV 50 mL  Symptoms: None  Follow-up CBG: Time:  0659   CBG Result:105  Possible Reasons for Event: Vomiting  Comments/MD notified:Kirby, NP    Janee Mornhompson, Aralyn Nowak L  Remember to initiate Hypoglycemia Order Set & complete

## 2013-08-06 NOTE — ED Notes (Signed)
Pt complaining of nausea and vomiting, meds ordered for same

## 2013-08-07 ENCOUNTER — Encounter (HOSPITAL_COMMUNITY): Payer: Self-pay | Admitting: Internal Medicine

## 2013-08-07 ENCOUNTER — Inpatient Hospital Stay (HOSPITAL_COMMUNITY): Payer: Medicaid - Out of State

## 2013-08-07 DIAGNOSIS — E876 Hypokalemia: Secondary | ICD-10-CM

## 2013-08-07 LAB — BASIC METABOLIC PANEL
BUN: 4 mg/dL — ABNORMAL LOW (ref 6–23)
CO2: 23 mEq/L (ref 19–32)
Calcium: 8.2 mg/dL — ABNORMAL LOW (ref 8.4–10.5)
Chloride: 107 mEq/L (ref 96–112)
Creatinine, Ser: 0.63 mg/dL (ref 0.50–1.10)
GFR calc Af Amer: >90 mL/min (ref >90)
Glucose, Bld: 102 mg/dL — ABNORMAL HIGH (ref 70–99)
POTASSIUM: 3.2 meq/L — AB (ref 3.7–5.3)
SODIUM: 142 meq/L (ref 137–147)

## 2013-08-07 LAB — GLUCOSE, CAPILLARY
Glucose-Capillary: 93 mg/dL (ref 70–99)
Glucose-Capillary: 100 mg/dL — ABNORMAL HIGH (ref 70–99)

## 2013-08-07 LAB — CBC
HCT: 35.6 % — ABNORMAL LOW (ref 36.0–46.0)
Hemoglobin: 12.3 g/dL (ref 12.0–15.0)
MCH: 30.1 pg (ref 26.0–34.0)
MCHC: 34.6 g/dL (ref 30.0–36.0)
MCV: 87 fL (ref 78.0–100.0)
Platelets: 196 10*3/uL (ref 150–400)
RBC: 4.09 MIL/uL (ref 3.87–5.11)
RDW: 13 % (ref 11.5–15.5)
WBC: 5.6 10*3/uL (ref 4.0–10.5)

## 2013-08-07 MED ORDER — NIFEDIPINE 10 MG PO CAPS: 10.0000 mg | ORAL_CAPSULE | Freq: Three times a day (TID) | ORAL | Status: DC

## 2013-08-07 MED ORDER — POTASSIUM CHLORIDE 20 MEQ PO PACK
40.0000 meq | PACK | Freq: Once | ORAL | Status: AC
  Administered 2013-08-07: 40 meq via ORAL
  Filled 2013-08-07: qty 2

## 2013-08-07 MED ORDER — POTASSIUM CHLORIDE IN NACL 20-0.9 MEQ/L-% IV SOLN: INTRAVENOUS | Status: DC

## 2013-08-07 MED ORDER — POTASSIUM CHLORIDE IN NACL 20-0.9 MEQ/L-% IV SOLN
INTRAVENOUS | Status: DC
  Filled 2013-08-07 (×2): qty 1000

## 2013-08-07 MED ORDER — POTASSIUM CHLORIDE CRYS ER 20 MEQ PO TBCR
40.0000 meq | EXTENDED_RELEASE_TABLET | Freq: Once | ORAL | Status: DC
  Filled 2013-08-07: qty 2

## 2013-08-07 NOTE — Progress Notes (Addendum)
Dr Regino SchultzeBrodie's office will be in touch with pt for ROV and esophageal manometry studies.  Can not set date for manometry until she is discharged (Epic quirk!) Once mano scheduled the ROV with Dr Juanda ChanceBrodie will be arranged to follow.   I will order a diet:  Soft because the tablet did not pass  Will need to take procardia before meals. Needs Rx for this at discharge.  Jennye MoccasinSarah Kavitha Lansdale  PA-C

## 2013-08-07 NOTE — Discharge Summary (Signed)
Physician Discharge Summary  Bard HerbertQuashay Caffey ZOX:[id number] DOB: 1991/06/08 DOA: 08/06/2013  PCP: Provider Not In System  Admit date: 08/06/2013 Discharge date: 08/07/2013  Time spent: >35 minutes  Recommendations for Outpatient Follow-up:  F/u with GI as scheduled  Discharge Diagnoses:  Principal Problem:   Dysphagia Active Problems:   Vomiting   Tobacco abuse   Protein-calorie malnutrition, severe   Achalasia, esophageal   Discharge Condition: stable   Diet recommendation: regular  Filed Weights   08/06/13 0555  Weight: 81.1 kg (178 lb 12.7 oz)    History of present illness:  23 y.o. female history of tobacco abuse presented to the ER because of increasing pain on swallowing. Patient has been having gradually worsening pain on swallowing over the last 4 months with 15 pound weight loss found admitted with dysphasia   Hospital Course:  1. Dysphasia, achalasia ; s/p EGD 1/21: hypertensive lower esophageal sphincter consistent with achalasia  -started procardia per GI, need manometry, possible surgical myotomy; GIO plan to schedule OP follow up  2.Tobacco abuse - patient advised to quit smoking.  3. Hypo K; replaced    Procedures:  EGD (i.e. Studies not automatically included, echos, thoracentesis, etc; not x-rays)  Consultations:  GI  Discharge Exam: Filed Vitals:   08/07/13 1321  BP: 112/75  Pulse: 65  Temp: 98.4 F (36.9 C)  Resp: 18    General: alert Cardiovascular: s1,s2 rrr Respiratory: CTA BL  Discharge Instructions  Discharge Orders   Future Orders Complete By Expires   Diet - low sodium heart healthy  As directed    Discharge instructions  As directed    Comments:     Please follow up with gastroenterologist outpatient in week   Increase activity slowly  As directed        Medication List         NIFEdipine 10 MG capsule  Commonly known as:  PROCARDIA  Take 1 capsule (10 mg total) by mouth 3 (three) times daily before meals.     omeprazole 20 MG capsule  Commonly known as:  PRILOSEC  Take 20 mg by mouth daily as needed (for acid reflux).       No Known Allergies     Follow-up Information   Follow up with Provider Not In System.      Follow up with Lina Sarora Brodie, MD In 1 week.   Specialty:  Gastroenterology   Contact information:   [address] [place] [address] [phone] [address]        The results of significant diagnostics from this hospitalization (including imaging, microbiology, ancillary and laboratory) are listed below for reference.    Significant Diagnostic Studies: Dg Esophagus  08/07/2013   CLINICAL DATA:  Difficulty swallowing. Status post endoscopy 08/06/2013.  EXAM: ESOPHOGRAM/BARIUM SWALLOW  TECHNIQUE: Single contrast examination was performed using water-soluble followed by thin barium.  COMPARISON:  None.  FLUOROSCOPY TIME:  1 min, 2 seconds  FINDINGS: The esophagus is markedly dilated with a beaked appearance at the gastroesophageal junction. Contrast does pass into the stomach but the distal esophagus does not open normally. A 13 mm barium tablet became lodged at the gastroesophageal junction and would not pass into the stomach.  IMPRESSION: Appearance of esophagus consistent with achalasia. As above, a 13 mm barium tablet lodged at the gastroesophageal junction and did not pass into the stomach.   Electronically Signed   By: Drusilla Kannerhomas  Dalessio M.D.   On: 08/07/2013 09:56   [facility]  08/[facility]2013   CLINICAL DATA:  Difficulty swallowing.  EXAM: PORTABLE CHEST - 1 VIEW  COMPARISON:  None.  FINDINGS: The lungs are well-aerated and clear. There is no evidence of focal opacification, pleural effusion or pneumothorax.  The cardiomediastinal silhouette is within normal limits. No acute osseous abnormalities are seen.  IMPRESSION: No acute cardiopulmonary process seen.   Electronically Signed   By: Roanna Raider M.D.   On: 08/06/2013 05:19    Microbiology: No results found for this or  any previous visit (from the past 240 hour(s)).   Labs: Basic Metabolic Panel:  Recent Labs Lab 08/05/13 1734 08/07/13 0358  NA 142 142  K 4.3 3.2*  CL 105 107  CO2 22 23  GLUCOSE 96 102*  BUN 7 4*  CREATININE 0.73 0.63  CALCIUM 9.3 8.2*   Liver Function Tests: No results found for this basename: AST, ALT, ALKPHOS, BILITOT, PROT, ALBUMIN,  in the last 168 hours No results found for this basename: LIPASE, AMYLASE,  in the last 168 hours No results found for this basename: AMMONIA,  in the last 168 hours CBC:  Recent Labs Lab 08/05/13 1734 08/07/13 0358  WBC 7.9 5.6  HGB 14.8 12.3  HCT 42.5 35.6*  MCV 87.8 87.0  PLT 212 196   Cardiac Enzymes: No results found for this basename: CKTOTAL, CKMB, CKMBINDEX, TROPONINI,  in the last 168 hours BNP: BNP (last 3 results) No results found for this basename: PROBNP,  in the last 8760 hours CBG:  Recent Labs Lab 08/06/13 1803 08/06/13 1910 08/06/13 2348 08/07/13 0621 08/07/13 1316  GLUCAP 61* 112* 80 93 100*       Signed:  Charmaine Placido N  [facility] 08/07/2013, 2:03 PM

## 2013-08-07 NOTE — Progress Notes (Signed)
Theresa [name] to be D/C'd Home per MD order.  Discussed with the patient and all questions fully answered.    Medication List         NIFEdipine 10 MG capsule  Commonly known as:  PROCARDIA  Take 1 capsule (10 mg total) by mouth 3 (three) times daily before meals.     omeprazole 20 MG capsule  Commonly known as:  PRILOSEC  Take 20 mg by mouth daily as needed (for acid reflux).        VVS, Skin clean, dry and intact without evidence of skin break down, no evidence of skin tears noted. IV catheter discontinued intact. Site without signs and symptoms of complications. Dressing and pressure applied.  An After Visit Summary was printed and given to the patient.  D/c education completed with patient/family including follow up instructions, medication list, d/c activities limitations if indicated, with other d/c instructions as indicated by MD - patient able to verbalize understanding, all questions fully answered.   Patient instructed to return to ED, call 911, or call MD for any changes in condition.   Patient escorted via WC, and D/C home via private auto.  Theresa [name], Theresa [name] F 08/07/2013 3:56 PM

## 2013-08-07 NOTE — Progress Notes (Signed)
   Subjective  Difficulty swallowing solids and liquids   Objective  EGD yesterday suggests achalasia, Ba esophagram today confirm achalasia- dilated esophagus, retained barium, beak deformity Vital signs in last 24 hours: Temp:  [97.8 F (36.6 C)-98.7 F (37.1 C)] 98 F (36.7 C) (01/22 0505) Pulse Rate:  [50-77] 54 (01/22 0505) Resp:  [11-24] 18 (01/22 0505) BP: (90-138)/(50-92) 111/80 mmHg (01/22 1145) SpO2:  [97 %-100 %] 99 % (01/22 0505) Last BM Date: 08/04/13 General:    white female in NAD Heart:  Regular rate and rhythm; no murmurs Lungs: Respirations even and unlabored, lungs CTA bilaterally Abdomen:  Soft, nontender and nondistended. Normal bowel sounds. Extremities:  Without edema. Neurologic:  Alert and oriented,  grossly normal neurologically. Psych:  Cooperative. Normal mood and affect.  Intake/Output from previous day: 01/21 0701 - 01/22 0700 In: 833 [P.O.:533; I.V.:300] Out: -  Intake/Output this shift:    Lab Results:  Recent Labs  08/05/13 1734 08/07/13 0358  WBC 7.9 5.6  HGB 14.8 12.3  HCT 42.5 35.6*  PLT 212 196   BMET  Recent Labs  08/05/13 1734 08/07/13 0358  NA 142 142  K 4.3 3.2*  CL 105 107  CO2 22 23  GLUCOSE 96 102*  BUN 7 4*  CREATININE 0.73 0.63  CALCIUM 9.3 8.2*   LFT No results found for this basename: PROT, ALBUMIN, AST, ALT, ALKPHOS, BILITOT, BILIDIR, IBILI,  in the last 72 hours PT/INR No results found for this basename: LABPROT, INR,  in the last 72 hours  Studies/Results: Dg Esophagus  08/07/2013   CLINICAL DATA:  Difficulty swallowing. Status post endoscopy 08/06/2013.  EXAM: ESOPHOGRAM/BARIUM SWALLOW  TECHNIQUE: Single contrast examination was performed using water-soluble followed by thin barium.  COMPARISON:  None.  FLUOROSCOPY TIME:  1 min, 2 seconds  FINDINGS: The esophagus is markedly dilated with a beaked appearance at the gastroesophageal junction. Contrast does pass into the stomach but the distal esophagus  does not open normally. A 13 mm barium tablet became lodged at the gastroesophageal junction and would not pass into the stomach.  IMPRESSION: Appearance of esophagus consistent with achalasia. As above, a 13 mm barium tablet lodged at the gastroesophageal junction and did not pass into the stomach.   Electronically Signed   By: Drusilla Kannerhomas  Dalessio M.D.   On: 08/07/2013 09:56   Dg Chest Port 1 View  08/06/2013   CLINICAL DATA:  Difficulty swallowing.  EXAM: PORTABLE CHEST - 1 VIEW  COMPARISON:  None.  FINDINGS: The lungs are well-aerated and clear. There is no evidence of focal opacification, pleural effusion or pneumothorax.  The cardiomediastinal silhouette is within normal limits. No acute osseous abnormalities are seen.  IMPRESSION: No acute cardiopulmonary process seen.   Electronically Signed   By: Roanna RaiderJeffery  Chang M.D.   On: 08/06/2013 05:19       Assessment / Plan:   Achalasia. Procardia started, will schedule outpatient manometry, OV with me, will need surgical consult for Heller myotomy  Principal Problem:   Dysphagia Active Problems:   Vomiting   Tobacco abuse   Protein-calorie malnutrition, severe   Achalasia, esophageal     LOS: 1 day   Theresa [name]  08/07/2013, 11:55 AM

## 2013-08-07 NOTE — Progress Notes (Signed)
[facility] PROGRESS NOTE  Bard HerbertQuashay Brackeen [id number] DOB: 1991-06-25 DOA: 08/06/2013 PCP: Provider Not In System  Assessment/Plan: 23 y.o. female history of tobacco abuse presented to the ER because of increasing pain on swallowing. Patient has been having gradually worsening pain on swallowing over the last 4 months with 15 pound weight loss found admitted with dysphasia   1. Dysphasia, achalasia ; s/p EGD 1/21: hypertensive lower esophageal sphincter consistent with achalasia -defer management to GI, pend barium ES: cont procardia;   2.Tobacco abuse - patient advised to quit smoking.  3. Hypo K; replace   Code Status: full Family Communication: d/w patient, family  (indicate person spoken with, relationship, and if by phone, the number) Disposition Plan: home when okay per GI   Consultants:  GI  Procedures:  EGD  Antibiotics:  None  (indicate start date, and stop date if known)  HPI/Subjective: alert  Objective: Filed Vitals:   08/07/13 0735  BP: 112/70  Pulse:   Temp:   Resp:     Intake/Output Summary (Last 24 hours) at 08/07/13 0952 Last data filed at 08/07/13 0200  Gross per 24 hour  Intake    833 ml  Output      0 ml  Net    833 ml   Filed Weights   08/06/13 0555  Weight: 81.1 kg (178 lb 12.7 oz)    Exam:   General:  alert  Cardiovascular: s1,s2 rrr  Respiratory: CTA BL  Abdomen: soft, nt, nd   Musculoskeletal: no LE edema3   Data Reviewed: Basic Metabolic Panel:  Recent Labs Lab 08/05/13 1734 08/07/13 0358  NA 142 142  K 4.3 3.2*  CL 105 107  CO2 22 23  GLUCOSE 96 102*  BUN 7 4*  CREATININE 0.73 0.63  CALCIUM 9.3 8.2*   Liver Function Tests: No results found for this basename: AST, ALT, ALKPHOS, BILITOT, PROT, ALBUMIN,  in the last 168 hours No results found for this basename: LIPASE, AMYLASE,  in the last 168 hours No results found for this basename: AMMONIA,  in the last 168 hours CBC:  Recent Labs Lab  08/05/13 1734 08/07/13 0358  WBC 7.9 5.6  HGB 14.8 12.3  HCT 42.5 35.6*  MCV 87.8 87.0  PLT 212 196   Cardiac Enzymes: No results found for this basename: CKTOTAL, CKMB, CKMBINDEX, TROPONINI,  in the last 168 hours BNP (last 3 results) No results found for this basename: PROBNP,  in the last 8760 hours CBG:  Recent Labs Lab 08/06/13 1149 08/06/13 1803 08/06/13 1910 08/06/13 2348 08/07/13 0621  GLUCAP 90 61* 112* 80 93    No results found for this or any previous visit (from the past 240 hour(s)).   Studies: Dg Chest Port 1 View  08/06/2013   CLINICAL DATA:  Difficulty swallowing.  EXAM: PORTABLE CHEST - 1 VIEW  COMPARISON:  None.  FINDINGS: The lungs are well-aerated and clear. There is no evidence of focal opacification, pleural effusion or pneumothorax.  The cardiomediastinal silhouette is within normal limits. No acute osseous abnormalities are seen.  IMPRESSION: No acute cardiopulmonary process seen.   Electronically Signed   By: Roanna RaiderJeffery  Chang M.D.   On: 08/06/2013 05:19    Scheduled Meds: . NIFEdipine  10 mg Oral TID AC  . pantoprazole (PROTONIX) IV  40 mg Intravenous Q24H  . potassium chloride  40 mEq Oral Once   Continuous Infusions: . dextrose 5 % and 0.9% NaCl 100 mL/hr at 08/07/13 0545  Principal Problem:   Dysphagia Active Problems:   Vomiting   Tobacco abuse   Protein-calorie malnutrition, severe   Achalasia, esophageal    Time spent: >35 minutes     Esperanza Sheets  [facility] Pager [phone]. If 7PM-7AM, please contact night-coverage at [email or web address], password [facility] 08/07/2013, 9:52 AM  LOS: 1 day

## 2013-08-07 NOTE — ED Provider Notes (Signed)
CSN: [id number]     Arrival date & time 08/05/13  1718 History   First MD Initiated Contact with Patient 08/06/13 0136     Chief Complaint  Patient presents with  . epigastic pain    (Consider location/radiation/quality/duration/timing/severity/associated sxs/prior Treatment) HPI Comments: 23 yo female with GERD hx, smoker presents with weight loss, vomiting and recurrent epig pain worsening for two months.  Pt has had 15 lbs wt loss, unable to keep food down. Initially solids, now even liquids get stuck or vomit afterward.  Minimal nsaids, minimal etoh, no known ulcers.  No bleeding.  The history is provided by the patient.    Past Medical History  Diagnosis Date  . Asthma   . Left fibular fracture 12/2011    referred by ED to outpt ortho. non-surgical mgt.   . Chlamydia age 34  . Obesity (BMI 30-39.9) 07/2013   Past Surgical History  Procedure Laterality Date  . Cesarean section  12/2008  . Tonsillectomy  07/2007    Dr Narda Bonds  . Esophagogastroduodenoscopy (egd) with esophageal dilation N/A 08/06/2013    Procedure: ESOPHAGOGASTRODUODENOSCOPY (EGD) WITH ESOPHAGEAL DILATION;  Surgeon: Hart Carwin, MD;  Location: [facility] ENDOSCOPY;  Service: Endoscopy;  Laterality: N/A;   Family History  Problem Relation Age of Onset  . Hypertension Other    History  Substance Use Topics  . Smoking status: Current Every Day Smoker -- 0.50 packs/day    Types: Cigarettes  . Smokeless tobacco: Never Used  . Alcohol Use: Yes     Comment: weekends   OB History   Grav Para Term Preterm Abortions TAB SAB Ect Mult Living                 Review of Systems  Constitutional: Positive for appetite change and unexpected weight change. Negative for fever and chills.  HENT: Negative for congestion.   Eyes: Negative for visual disturbance.  Respiratory: Negative for shortness of breath.   Cardiovascular: Negative for chest pain.  Gastrointestinal: Positive for nausea, vomiting and abdominal pain.   Genitourinary: Negative for dysuria and flank pain.  Musculoskeletal: Negative for back pain, neck pain and neck stiffness.  Skin: Negative for rash.  Neurological: Negative for light-headedness and headaches.    Allergies  Review of patient's allergies indicates no known allergies.  Home Medications   Current Outpatient Rx  Name  Route  Sig  Dispense  Refill  . NIFEdipine (PROCARDIA) 10 MG capsule   Oral   Take 1 capsule (10 mg total) by mouth 3 (three) times daily before meals.   30 capsule   1    BP 112/75  Pulse 65  Temp(Src) 98.4 F (36.9 C) (Oral)  Resp 18  Ht 5\' 4"  (1.626 m)  Wt 178 lb 12.7 oz (81.1 kg)  BMI 30.67 kg/m2  SpO2 99%  LMP 07/14/2013 Physical Exam  Nursing note and vitals reviewed. Constitutional: She is oriented to person, place, and time. She appears well-developed and well-nourished.  HENT:  Head: Normocephalic and atraumatic.  Mild dry mm  Eyes: Conjunctivae are normal. Right eye exhibits no discharge. Left eye exhibits no discharge.  Neck: Normal range of motion. Neck supple. No tracheal deviation present.  Cardiovascular: Normal rate and regular rhythm.   Pulmonary/Chest: Effort normal and breath sounds normal.  Abdominal: Soft. She exhibits no distension. There is tenderness (epig mild). There is no guarding.  Musculoskeletal: She exhibits no edema.  Neurological: She is alert and oriented to person, place, and time.  Skin: Skin is warm. No rash noted.    ED Course  Procedures (including critical care time) Labs Review Labs Reviewed  GLUCOSE, CAPILLARY - Abnormal; Notable for the following:    Glucose-Capillary 66 (*)    All other components within normal limits  GLUCOSE, CAPILLARY - Abnormal; Notable for the following:    Glucose-Capillary 105 (*)    All other components within normal limits  BASIC METABOLIC PANEL - Abnormal; Notable for the following:    Potassium 3.2 (*)    Glucose, Bld 102 (*)    BUN 4 (*)    Calcium 8.2 (*)     All other components within normal limits  CBC - Abnormal; Notable for the following:    HCT 35.6 (*)    All other components within normal limits  GLUCOSE, CAPILLARY - Abnormal; Notable for the following:    Glucose-Capillary 61 (*)    All other components within normal limits  GLUCOSE, CAPILLARY - Abnormal; Notable for the following:    Glucose-Capillary 112 (*)    All other components within normal limits  GLUCOSE, CAPILLARY - Abnormal; Notable for the following:    Glucose-Capillary 100 (*)    All other components within normal limits  CBC  BASIC METABOLIC PANEL  GLUCOSE, CAPILLARY  GLUCOSE, CAPILLARY  GLUCOSE, CAPILLARY  POCT PREGNANCY, URINE   Imaging Review Dg Esophagus  08/07/2013   CLINICAL DATA:  Difficulty swallowing. Status post endoscopy 08/06/2013.  EXAM: ESOPHOGRAM/BARIUM SWALLOW  TECHNIQUE: Single contrast examination was performed using water-soluble followed by thin barium.  COMPARISON:  None.  FLUOROSCOPY TIME:  1 min, 2 seconds  FINDINGS: The esophagus is markedly dilated with a beaked appearance at the gastroesophageal junction. Contrast does pass into the stomach but the distal esophagus does not open normally. A 13 mm barium tablet became lodged at the gastroesophageal junction and would not pass into the stomach.  IMPRESSION: Appearance of esophagus consistent with achalasia. As above, a 13 mm barium tablet lodged at the gastroesophageal junction and did not pass into the stomach.   Electronically Signed   By: Drusilla Kannerhomas  Dalessio M.D.   On: 08/07/2013 09:56   Dg Chest Port 1 View  08/06/2013   CLINICAL DATA:  Difficulty swallowing.  EXAM: PORTABLE CHEST - 1 VIEW  COMPARISON:  None.  FINDINGS: The lungs are well-aerated and clear. There is no evidence of focal opacification, pleural effusion or pneumothorax.  The cardiomediastinal silhouette is within normal limits. No acute osseous abnormalities are seen.  IMPRESSION: No acute cardiopulmonary process seen.    Electronically Signed   By: Roanna RaiderJeffery  Chang M.D.   On: 08/06/2013 05:19    EKG Interpretation    Date/Time:  Tuesday August 05 2013 17:27:34 EST Ventricular Rate:  64 PR Interval:  144 QRS Duration: 74 QT Interval:  390 QTC Calculation: 402 R Axis:   88 Text Interpretation:  Normal sinus rhythm Normal ECG Confirmed by Mailin Coglianese  MD, Jodey Burbano ([id number]) on 08/06/2013 1:36:26 AM            MDM   1. Vomiting   2. Epigastric pain   3. Dysphagia   4. Tobacco abuse   5. Achalasia, esophageal   6. Hypokalemia   7. Protein-calorie malnutrition, severe    Recurrent issue. Initial plan for close outpt fup with GI for scope however pt unable to tolerate po, pt very upset with  Worsening sxs/ wt loss. Spoke with GI for consult, spoke with [facility] for admission.  Fluids and nausea meds in  ED.   Epig pain, Dehydration, malnutrition, recurrent vomiting Dysphagia  The patients results and plan were reviewed and discussed.   Any x-rays performed were personally reviewed by myself.   Differential diagnosis were considered with the presenting HPI.   Admission/ observation were discussed with the admitting physician, patient and/or family and they are comfortable with the plan.      Enid Skeens, MD 08/07/13 [phone]

## 2013-08-08 ENCOUNTER — Encounter: Payer: Self-pay | Admitting: *Deleted

## 2013-08-08 ENCOUNTER — Telehealth: Payer: Self-pay | Admitting: *Deleted

## 2013-08-08 NOTE — Telephone Encounter (Signed)
Spoke with Noreene LarssonJill and scheduled patient for esophageal manometry on 08/18/13 at 9:30 AM.

## 2013-08-08 NOTE — Telephone Encounter (Signed)
Scheduled OV for 09/09/13 at 1:45 PM. (per Dr. Juanda ChanceBrodie, needs OV after results of manometry. Left a message for patient to call me at [phone]. (number given to me by patient yesterday)Patient gave me address yesterday as [address]  [place], [place] [address]

## 2013-08-11 ENCOUNTER — Encounter: Payer: Self-pay | Admitting: *Deleted

## 2013-08-11 NOTE — Telephone Encounter (Signed)
Spoke with patient and reviewed date, time and instructions for 08/18/13 manometry. Mailed instructions to patient. OV reminded mailed also.

## 2013-08-11 NOTE — Telephone Encounter (Signed)
Left a message for patient to call me at # patient gave and cell number.

## 2013-08-12 ENCOUNTER — Encounter: Payer: Self-pay | Admitting: *Deleted

## 2013-08-18 ENCOUNTER — Ambulatory Visit (HOSPITAL_COMMUNITY)
Admission: RE | Admit: 2013-08-18 | Discharge: 2013-08-18 | Disposition: A | Discharge status: Home / Self Care | Payer: Medicaid - Out of State | Source: Ambulatory Visit | Attending: Internal Medicine | Admitting: Internal Medicine

## 2013-08-18 ENCOUNTER — Encounter (HOSPITAL_COMMUNITY)
Admission: RE | Disposition: A | Discharge status: Home / Self Care | Payer: Self-pay | Source: Ambulatory Visit | Attending: Internal Medicine

## 2013-08-18 DIAGNOSIS — R1310 Dysphagia, unspecified: Secondary | ICD-10-CM

## 2013-08-18 DIAGNOSIS — K220 Achalasia of cardia: Secondary | ICD-10-CM | POA: Insufficient documentation

## 2013-08-18 HISTORY — PX: ESOPHAGEAL MANOMETRY: SHX5429

## 2013-08-18 SURGERY — MANOMETRY, ESOPHAGUS

## 2013-08-18 MED ORDER — LIDOCAINE VISCOUS 2 % MT SOLN
OROMUCOSAL | Status: AC
  Filled 2013-08-18: qty 15

## 2013-08-18 SURGICAL SUPPLY — 1 items: FACESHIELD LNG OPTICON STERILE (SAFETY) IMPLANT

## 2013-08-18 NOTE — H&P (View-Only) (Signed)
[facility] Consult: 2:03 PM 08/06/2013  LOS: 0 days    Referring Provider: York Spaniel MD Primary Care Physician:  Melene Plan to ED for all medical issues.  Primary Gastroenterologist:  unassigned    Reason for Consultation:  Dysphagia   HPI: Theresa [name] is a 23 y.o. female.   4 months hx dysphagia, did not improve despite using OTC Prilosec for last 2 weeks.  She reports 15 # weight loss though she is still obese at 178 #/BMI 30.   Initially began with solids at beginning of meals.  Would resolve if she drank liquids.  Progressed to solids and liquids occurring whenever she swallowed.  For 2 to 4 weeks regurgitating all po's. Neither Prilosec, antacids have helped.  There is pain/discomfort when food lodges at level of GE junction, but description is not that of primary odynophagia. No previous or current dyspepsia or sxs c/w GERD.  Stable qod BMs.  No blood ever comes up with food.   BMET, CBC, chest films all unrevealing and WNL.     Past Medical History  Diagnosis Date  . Asthma   . Left fibular fracture 12/2011    referred by ED to outpt ortho. non-surgical mgt.   . Chlamydia age 46  . Obesity (BMI 30-39.9) 07/2013    Past Surgical History  Procedure Laterality Date  . Cesarean section  12/2008  . Tonsillectomy  07/2007    Dr Narda Bonds    Prior to Admission medications   Medication Sig Start Date End Date Taking? Authorizing Provider  omeprazole (PRILOSEC) 20 MG capsule Take 20 mg by mouth daily as needed (for acid reflux).   Yes Historical Provider, MD    Scheduled Meds: . pantoprazole (PROTONIX) IV  40 mg Intravenous Q24H   Infusions: . dextrose 5 % and 0.9% NaCl 100 mL/hr at 08/06/13 0634   PRN Meds: acetaminophen, acetaminophen, ondansetron (ZOFRAN) IV, ondansetron   Allergies as of  08/05/2013  . (No Known Allergies)    Family History  Problem Relation Age of Onset  . Hypertension Other     History   Social History  . Marital Status: Single    Spouse Name: N/A    Number of Children: 1  . Years of Education: Some college   Occupational History  . Works at a [employer].  Involves some heavy lifting   Social History Main Topics  . Smoking status: Current Every Day Smoker -- 0.50 packs/day    Types: Cigarettes  . Smokeless tobacco: Never Used  . Alcohol Use: Yes     Comment: weekends  . Drug Use: No  . Sexual Activity: Not on file   Other Topics Concern  . Not on file   Social History Narrative  . 67 year old child as of 07/2008    REVIEW OF SYSTEMS: Constitutional:  Weight loss, some fatigue but not profound ENT:  No nose bleeds Pulm:  No SOB or cough CV:  No palpitations, no LE edema.  GU:  No hematuria, no frequency GI:  Per HPI Heme:  No hx anemia   Transfusions:  none Neuro:  No headaches, no peripheral tingling or numbness Derm:  No itching, no rash or sores.  tatoos date from age 38 to 50 y/o Endocrine:  No sweats or chills.  No polyuria or dysuria Immunization:  Not queried.  Travel:  None beyond local counties in last few months.    PHYSICAL EXAM: Vital signs in last 24 hours: Filed Vitals:   08/06/13 0555  BP: 118/66  Pulse: 56  Temp: 98.3 F (36.8 C)  Resp: 17   Wt Readings from Last 3 Encounters:  08/06/13 81.1 kg (178 lb 12.7 oz)  04/25/13 90.447 kg (199 lb 6.4 oz)  01/13/12 90.719 kg (200 lb)   General: looks well, not obese appearing Head:  No trauma or swelling.  No asymmetry  Eyes:  No icterus or pallor Ears:  Not HOH  Nose:  No discharge or congestion.  Mouth:  Clear, moist, good dentition Neck:  No mass, no goiter.  Not tender.  Lungs:  Clear bil.  No cough or dyspnea Heart: RRR Abdomen:  Soft, ND, no mass or HSM.  Active BS. Not obese.  Slight tenderness in LUQ Rectal:  deferred Musc/Skeltl: no joint swelling or deformity Extremities:  No CCE.  Feet warm and well perfused  Neurologic:  No tremor, oriented x 3.  No tremor or limb weakness.  No gross deficits Skin:  No sores or rash Tattoos:  Several professional grade tatoos on trunk and limbs Nodes:  No cervical adenopathy.    Psych:  Pleasant, cooperative, high energy.   Intake/Output from previous day: 01/20 0701 - 01/21 0700 In: 1000 [I.V.:1000] Out: -  Intake/Output this shift:    LAB RESULTS:  Recent Labs  08/05/13 1734  WBC 7.9  HGB 14.8  HCT 42.5  PLT 212   BMET Lab Results  Component Value Date   NA 142 08/05/2013   K 4.3 08/05/2013   CL 105 08/05/2013   CO2 22 08/05/2013   GLUCOSE 96 08/05/2013   BUN 7 08/05/2013   CREATININE 0.73 08/05/2013   CALCIUM 9.3 08/05/2013   LFT No results found for this basename: PROT, ALBUMIN, AST, ALT, ALKPHOS, BILITOT, BILIDIR, IBILI,  in the last 72 hours PT/INR No results found for this basename: INR,  PROTIME   Hepatitis Panel No results found for this basename: HEPBSAG, HCVAB, HEPAIGM, HEPBIGM,  in the last 72 hours C-Diff No components found with this basename: cdiff   Lipase  No results found for this basename: lipase    Drugs of Abuse  No results found for this basename: labopia,  cocainscrnur,  labbenz,  amphetmu,  thcu,  labbarb     RADIOLOGY STUDIES: Dg Chest Port 1 View  08/06/2013   CLINICAL DATA:  Difficulty swallowing.  EXAM: PORTABLE CHEST - 1 VIEW  COMPARISON:  None.  FINDINGS: The lungs are well-aerated and clear. There is no evidence of focal opacification, pleural effusion or pneumothorax.  The cardiomediastinal silhouette is within normal limits. No acute osseous abnormalities are seen.  IMPRESSION: No acute cardiopulmonary process seen.   Electronically Signed   By: Roanna Raider M.D.   On: 08/06/2013 05:19    ENDOSCOPIC STUDIES: None ever  IMPRESSION:   *  Dysphagia. Progressive over 4 months, with significant  weight loss.  Rule out stricture at GE junction.  She is quite young to be suffering from esophageal dysmotility or to have esophageal tumor. Pt otherwise healthy.     PLAN:     *  EGD. This afternoon, late. Keep npo    Jennye MoccasinSarah Gribbin  08/06/2013, 2:03 PM Pager: [phone] Attending MD note:   I have taken a history, examined the patient, and reviewed the chart. I agree with the Advanced Practitioner's impression and recommendations. Solid food dysphagia of recent onset. Rule out esophageal stricture when or fibers ring. Doubt malignancy or motility problem. For upper endoscopy this afternoon  Willa Roughora Brodie,MD [facility] Pager # [phone]

## 2013-08-18 NOTE — Interval H&P Note (Signed)
History and Physical Interval Note:  08/18/2013 11:40 AM  Theresa [name]  has presented today for surgery, with the diagnosis of achalasia  The various methods of treatment have been discussed with the patient and family. After consideration of risks, benefits and other options for treatment, the patient has consented to  Procedure(s): ESOPHAGEAL MANOMETRY (EM) (N/A) as a surgical intervention .  The patient's history has been reviewed, patient examined, no change in status, stable for surgery.  I have reviewed the patient's chart and labs.  Questions were answered to the patient's satisfaction.     [name]

## 2013-08-19 ENCOUNTER — Encounter (HOSPITAL_COMMUNITY): Payer: Self-pay | Admitting: Internal Medicine

## 2013-08-22 ENCOUNTER — Telehealth: Payer: Self-pay | Admitting: Internal Medicine

## 2013-08-22 NOTE — Telephone Encounter (Signed)
Spoke with patient and scheduled her for OV on 08/26/13 at 11:00 AM.

## 2013-08-26 ENCOUNTER — Telehealth: Payer: Self-pay | Admitting: Internal Medicine

## 2013-08-26 ENCOUNTER — Ambulatory Visit: Payer: PRIVATE HEALTH INSURANCE | Admitting: Internal Medicine

## 2013-08-26 NOTE — Telephone Encounter (Signed)
I have spoken to the pt on the phone because she could not afford the OV today. We discussed different options for treatment of achalasia: such as to continue Procardia, or proceed with Pneumatic dilation or  Botox injection or Heller myotomy. She has been much improved on Procardia and prefers to continue for next 3 months and get back to me in 3 months for an update. I gave her statistics about effectiveness of each treatment approach.She knows she will eventually need a definitive therapy. [place]. She has not had any vomiting since Procardia started.

## 2013-09-09 ENCOUNTER — Ambulatory Visit: Payer: PRIVATE HEALTH INSURANCE | Admitting: Internal Medicine

## 2013-09-26 ENCOUNTER — Encounter: Payer: Self-pay | Admitting: Internal Medicine

## 2013-11-17 ENCOUNTER — Inpatient Hospital Stay (HOSPITAL_COMMUNITY)
Admission: AD | Admit: 2013-11-17 | Discharge: 2013-11-17 | Disposition: A | Discharge status: Home / Self Care | Payer: Medicaid - Out of State | Source: Ambulatory Visit | Attending: Obstetrics & Gynecology | Admitting: Obstetrics & Gynecology

## 2013-11-17 ENCOUNTER — Encounter (HOSPITAL_COMMUNITY): Payer: Self-pay | Admitting: *Deleted

## 2013-11-17 DIAGNOSIS — N3090 Cystitis, unspecified without hematuria: Secondary | ICD-10-CM | POA: Insufficient documentation

## 2013-11-17 DIAGNOSIS — N926 Irregular menstruation, unspecified: Secondary | ICD-10-CM | POA: Insufficient documentation

## 2013-11-17 DIAGNOSIS — R109 Unspecified abdominal pain: Secondary | ICD-10-CM | POA: Insufficient documentation

## 2013-11-17 DIAGNOSIS — R310 Gross hematuria: Secondary | ICD-10-CM | POA: Insufficient documentation

## 2013-11-17 DIAGNOSIS — F17200 Nicotine dependence, unspecified, uncomplicated: Secondary | ICD-10-CM | POA: Insufficient documentation

## 2013-11-17 DIAGNOSIS — J45909 Unspecified asthma, uncomplicated: Secondary | ICD-10-CM | POA: Insufficient documentation

## 2013-11-17 DIAGNOSIS — N3091 Cystitis, unspecified with hematuria: Secondary | ICD-10-CM

## 2013-11-17 DIAGNOSIS — E669 Obesity, unspecified: Secondary | ICD-10-CM | POA: Insufficient documentation

## 2013-11-17 DIAGNOSIS — Z6830 Body mass index (BMI) 30.0-30.9, adult: Secondary | ICD-10-CM | POA: Insufficient documentation

## 2013-11-17 LAB — URINE MICROSCOPIC-ADD ON

## 2013-11-17 LAB — URINALYSIS, ROUTINE W REFLEX MICROSCOPIC
Bilirubin Urine: NEGATIVE
Glucose, UA: NEGATIVE mg/dL
Ketones, ur: NEGATIVE mg/dL
Nitrite: NEGATIVE
PH: 6 (ref 5.0–8.0)
Protein, ur: 30 mg/dL — AB
Specific Gravity, Urine: 1.02 (ref 1.005–1.030)
UROBILINOGEN UA: 0.2 mg/dL (ref 0.0–1.0)

## 2013-11-17 LAB — POCT PREGNANCY, URINE: Preg Test, Ur: NEGATIVE

## 2013-11-17 MED ORDER — PHENAZOPYRIDINE HCL 100 MG PO TABS: 100.0000 mg | ORAL_TABLET | Freq: Three times a day (TID) | ORAL | Status: DC | PRN

## 2013-11-17 MED ORDER — CIPROFLOXACIN HCL 500 MG PO TABS: 500.0000 mg | ORAL_TABLET | Freq: Two times a day (BID) | ORAL | Status: DC

## 2013-11-17 NOTE — MAU Provider Note (Signed)
CC: Abdominal Pain, Back Pain and Hematuria    First Provider Initiated Contact with Patient 11/17/13 1706      HPI Theresa [name] is a 23 y.o. G1P1001  who presents with onset of gross hematuria this am after having 3 days of suprapubic abdominal pain and LBP. Pain is constant but more with urination. Endorses urgency and fequency. She is afraid t is due to her IUD. Denies fever/chills, nausea, vomiting.  Irregular menses since Mirena placed in 2013.  From [place]. Thinks she wants IUD out due to 2 episodes dyparunia prior to UTI sx.   Past Medical History  Diagnosis Date  . Asthma   . Left fibular fracture 12/2011    referred by ED to outpt ortho. non-surgical mgt.   . Chlamydia age 23  . Obesity (BMI 30-39.9) 07/2013  . Achalasia   No hx kidney stones  OB History  Gravida Para Term Preterm AB SAB TAB Ectopic Multiple Living  1 1 1       1     # Outcome Date GA Lbr Len/2nd Weight Sex Delivery Anes PTL Lv  1 TRM      LTCS   Y      Past Surgical History  Procedure Laterality Date  . Cesarean section  12/2008  . Tonsillectomy  07/2007    Dr Narda Bondshris Newman  . Esophagogastroduodenoscopy (egd) with esophageal dilation N/A 08/06/2013    Procedure: ESOPHAGOGASTRODUODENOSCOPY (EGD) WITH ESOPHAGEAL DILATION;  Surgeon: Hart Carwinora M Brodie, MD;  [facility];  Service: Endoscopy;  Laterality: N/A;  . Esophageal manometry N/A 08/18/2013    Procedure: ESOPHAGEAL MANOMETRY (EM);  Surgeon: Hart Carwinora M Brodie, MD;  Location: [facility] ENDOSCOPY;  Service: Endoscopy;  Laterality: N/A;    History   Social History  . Marital Status: Single    Spouse Name: N/A    Number of Children: N/A  . Years of Education: N/A   Occupational History  . Not on file.   Social History Main Topics  . Smoking status: Current Every Day Smoker -- 0.50 packs/day    Types: Cigarettes  . Smokeless tobacco: Never Used  . Alcohol Use: Yes     Comment: weekends  . Drug Use: No  . Sexual Activity: Yes    Birth Control/  Protection: IUD   Other Topics Concern  . Not on file   Social History Narrative  . No narrative on file    No current facility-administered medications on file prior to encounter.   Current Outpatient Prescriptions on File Prior to Encounter  Medication Sig Dispense Refill  . NIFEdipine (PROCARDIA) 10 MG capsule Take 1 capsule (10 mg total) by mouth 3 (three) times daily before meals.  30 capsule  1  . omeprazole (PRILOSEC) 20 MG capsule Take 20 mg by mouth daily as needed (for acid reflux).        No Known Allergies  ROS Pertinent items in HPI  PHYSICAL EXAM Filed Vitals:   11/17/13 1619  BP: 113/64  Pulse: 90  Temp: 98.8 F (37.1 C)  Resp: 16   General: Well nourished, well developed female in no acute distress Cardiovascular: Normal rate Respiratory: Normal effort Abdomen: Soft, mild-mod suprapubic tenderness Back: No CVAT Extremities: No edema Neurologic: Alert and oriented Bimanual exam: cervix closed, stings in place, no CMT; uterus or bladder TTP; no adnexal tenderness or masses   LAB RESULTS Results for orders placed during the hospital encounter of 11/17/13 (from the past 24 hour(s))  URINALYSIS, ROUTINE W REFLEX MICROSCOPIC  Status: Abnormal   Collection Time    11/17/13  4:25 PM      Result Value Ref Range   Color, Urine YELLOW  YELLOW   APPearance CLOUDY (*) CLEAR   Specific Gravity, Urine 1.020  1.005 - 1.030   pH 6.0  5.0 - 8.0   Glucose, UA NEGATIVE  NEGATIVE mg/dL   Hgb urine dipstick LARGE (*) NEGATIVE   Bilirubin Urine NEGATIVE  NEGATIVE   Ketones, ur NEGATIVE  NEGATIVE mg/dL   Protein, ur 30 (*) NEGATIVE mg/dL   Urobilinogen, UA 0.2  0.0 - 1.0 mg/dL   Nitrite NEGATIVE  NEGATIVE   Leukocytes, UA LARGE (*) NEGATIVE  URINE MICROSCOPIC-ADD ON     Status: Abnormal   Collection Time    11/17/13  4:25 PM      Result Value Ref Range   Squamous Epithelial / LPF RARE  RARE   WBC, UA TOO NUMEROUS TO COUNT  <3 WBC/hpf   RBC / HPF 3-6  <3  RBC/hpf   Bacteria, UA MANY (*) RARE   Urine-Other FIELD OBSCURED BY WBC'S    POCT PREGNANCY, URINE     Status: None   Collection Time    11/17/13  5:39 PM      Result Value Ref Range   Preg Test, Ur NEGATIVE  NEGATIVE    IMAGING No results found.  MAU COURSE   ASSESSMENT  1. Cystitis with hematuria     PLAN Discharge home. See AVS for patient education.    Medication List         ciprofloxacin 500 MG tablet  Commonly known as:  CIPRO  Take 1 tablet (500 mg total) by mouth 2 (two) times daily.     NIFEdipine 10 MG capsule  Commonly known as:  PROCARDIA  Take 1 capsule (10 mg total) by mouth 3 (three) times daily before meals.     omeprazole 20 MG capsule  Commonly known as:  PRILOSEC  Take 20 mg by mouth daily as needed (for acid reflux).     phenazopyridine 100 MG tablet  Commonly known as:  PYRIDIUM  Take 1 tablet (100 mg total) by mouth 3 (three) times daily as needed for pain.       Follow-up Information   Follow up with [facility]. (Someone from Clinic will call you with appt.)    Contact information:   [address] [place] [address] [phone]        Danae OrleansDeirdre C Demaris Leavell, CNM 11/17/2013 5:07 PM

## 2013-11-17 NOTE — Discharge Instructions (Signed)
Urinary Tract Infection  Urinary tract infections (UTIs) can develop anywhere along your urinary tract. Your urinary tract is your body's drainage system for removing wastes and extra water. Your urinary tract includes two kidneys, two ureters, a bladder, and a urethra. Your kidneys are a pair of bean-shaped organs. Each kidney is about the size of your fist. They are located below your ribs, one on each side of your spine.  CAUSES  Infections are caused by microbes, which are microscopic organisms, including fungi, viruses, and bacteria. These organisms are so small that they can only be seen through a microscope. Bacteria are the microbes that most commonly cause UTIs.  SYMPTOMS   Symptoms of UTIs may vary by age and gender of the patient and by the location of the infection. Symptoms in young women typically include a frequent and intense urge to urinate and a painful, burning feeling in the bladder or urethra during urination. Older women and men are more likely to be tired, shaky, and weak and have muscle aches and abdominal pain. A fever may mean the infection is in your kidneys. Other symptoms of a kidney infection include pain in your back or sides below the ribs, nausea, and vomiting.  DIAGNOSIS  To diagnose a UTI, your caregiver will ask you about your symptoms. Your caregiver also will ask to provide a urine sample. The urine sample will be tested for bacteria and white blood cells. White blood cells are made by your body to help fight infection.  TREATMENT   Typically, UTIs can be treated with medication. Because most UTIs are caused by a bacterial infection, they usually can be treated with the use of antibiotics. The choice of antibiotic and length of treatment depend on your symptoms and the type of bacteria causing your infection.  HOME CARE INSTRUCTIONS   If you were prescribed antibiotics, take them exactly as your caregiver instructs you. Finish the medication even if you feel better after you  have only taken some of the medication.   Drink enough water and fluids to keep your urine clear or pale yellow.   Avoid caffeine, tea, and carbonated beverages. They tend to irritate your bladder.   Empty your bladder often. Avoid holding urine for long periods of time.   Empty your bladder before and after sexual intercourse.   After a bowel movement, women should cleanse from front to back. Use each tissue only once.  SEEK MEDICAL CARE IF:    You have back pain.   You develop a fever.   Your symptoms do not begin to resolve within 3 days.  SEEK IMMEDIATE MEDICAL CARE IF:    You have severe back pain or lower abdominal pain.   You develop chills.   You have nausea or vomiting.   You have continued burning or discomfort with urination.  MAKE SURE YOU:    Understand these instructions.   Will watch your condition.   Will get help right away if you are not doing well or get worse.  Document Released: 04/12/2005 Document Revised: 01/02/2012 Document Reviewed: 08/11/2011  Exit[employer] Patient Information 2014 [employer].

## 2013-11-17 NOTE — MAU Note (Signed)
Patient state she has had blood in her urine since this am, has had abdominal and back pain for 3 days. Has a Mirena since 10-13 and has irregular periods. Denies bleeding or discharge.

## 2013-11-18 NOTE — MAU Provider Note (Signed)
Attestation of Attending Supervision of Advanced Practitioner (PA/CNM/NP): Evaluation and management procedures were performed by the Advanced Practitioner under my supervision and collaboration.  I have reviewed the Advanced Practitioner's note and chart, and I agree with the management and plan.  Lilie Vezina, MD, FACOG Attending Obstetrician & Gynecologist Faculty Practice, [facility]  

## 2013-12-31 ENCOUNTER — Telehealth: Payer: Self-pay | Admitting: Internal Medicine

## 2013-12-31 ENCOUNTER — Other Ambulatory Visit: Payer: Self-pay | Admitting: *Deleted

## 2013-12-31 DIAGNOSIS — K220 Achalasia of cardia: Secondary | ICD-10-CM

## 2013-12-31 NOTE — Telephone Encounter (Signed)
Left a message to call back.

## 2013-12-31 NOTE — Telephone Encounter (Signed)
Left a message for patient to call back. 

## 2013-12-31 NOTE — Telephone Encounter (Signed)
Patient calling to report that for the last month, she has been having symptoms again. States feels like food gets stuck in her throat, has regurgitation in sleep. She is not vomiting. She has not taken Procardia for the last 3 days because she feels it is not working anymore. Please, advise.

## 2013-12-31 NOTE — Telephone Encounter (Signed)
Spoke with Noreene LarssonJill at [facility] end[facility] and scheduled prop EGD with BOTOX on 01/26/14 at 11:00 AM. Pre visit on 01/14/14 at 3:30 PM. Patient notified of appointment dates and times.

## 2013-12-31 NOTE — Telephone Encounter (Signed)
Please set up [facility], Achalasia

## 2014-01-05 ENCOUNTER — Encounter (HOSPITAL_COMMUNITY): Payer: Self-pay | Admitting: Pharmacy Technician

## 2014-01-09 ENCOUNTER — Encounter: Payer: Self-pay | Admitting: *Deleted

## 2014-01-09 ENCOUNTER — Ambulatory Visit: Payer: PRIVATE HEALTH INSURANCE | Admitting: Nurse Practitioner

## 2014-01-09 ENCOUNTER — Telehealth: Payer: Self-pay | Admitting: *Deleted

## 2014-01-09 NOTE — Progress Notes (Signed)
Pt called the front desk and stated that she had an appt scheduled for this am @ 0800 but she was told that she could come in @ 0930 when the appt was scheduled.  She also informed me that she is having severe pain from the IUD and wants it to be removed.  I advised pt to go to MAU to be evaluated for her pain.  Pt then asked will the ER take out the Mirena.  I advised pt that they probably would not and that they would address her pain for sure but to please go ahead and reschedule the appt her at the clinics so that she could talk to the Katlyne Nishida about removal.  Pt stated understanding with no further questions.

## 2014-01-09 NOTE — Telephone Encounter (Signed)
Theresa [name] missed a scheduled appointment today for IUD removal. Called and left a message she missed a scheduled appointment. Call back if wishes to reschedule. Also sent letter.

## 2014-01-12 ENCOUNTER — Ambulatory Visit: Payer: PRIVATE HEALTH INSURANCE | Admitting: Family Medicine

## 2014-01-12 ENCOUNTER — Telehealth: Payer: Self-pay | Admitting: *Deleted

## 2014-01-12 NOTE — Telephone Encounter (Signed)
Pt states she came to the appointment but did not have the funds that are charged for the removal.  Pt states she has no insurance and need to know other options.  Encouraged patient to contact the [facility].  Pt verbalizes understanding.

## 2014-01-14 ENCOUNTER — Ambulatory Visit (AMBULATORY_SURGERY_CENTER): Payer: Self-pay | Admitting: *Deleted

## 2014-01-14 ENCOUNTER — Encounter (HOSPITAL_COMMUNITY): Payer: Self-pay | Admitting: *Deleted

## 2014-01-14 VITALS — Ht 64.0 in | Wt 183.2 lb

## 2014-01-14 DIAGNOSIS — R1310 Dysphagia, unspecified: Secondary | ICD-10-CM

## 2014-01-21 ENCOUNTER — Encounter: Payer: Self-pay | Admitting: Internal Medicine

## 2014-01-21 NOTE — Progress Notes (Signed)
6 pages from [employer]. For [employer] and Leave given to Theresa GarnetBarbara [name] to give to [facility] so information can be filled out.

## 2014-01-23 NOTE — H&P (Signed)
History of present illness:  23 y.o. female history of tobacco abuse presented to the ER because of increasing pain on swallowing. Patient has been having gradually worsening pain on swallowing over the last 4 months with 15 pound weight loss found admitted with dysphasia  Hospital Course:  1. Dysphasia, achalasia ; s/p EGD 1/21: hypertensive lower esophageal sphincter consistent with achalasia  -started procardia per GI, need manometry, possible surgical myotomy; GIO plan to schedule OP follow up  2.Tobacco abuse - patient advised to quit smoking.  3. Hypo K; replaced  Procedures:  EGD (i.e. Studies not automatically included, echos, thoracentesis, etc; not x-rays) Consultations:  GI Discharge Exam:  Filed Vitals:    08/07/13 1321   BP:  112/75   Pulse:  65   Temp:  98.4 F (36.9 C)   Resp:  18   General: alert  Cardiovascular: s1,s2 rrr  Respiratory: CTA BL  Discharge Instructions  Discharge Orders    Future Orders  Complete By  Expires    Diet - low sodium heart healthy  As directed     Discharge instructions  As directed     Comments:    Please follow up with gastroenterologist outpatient in week    Increase activity slowly  As directed         Medication List         NIFEdipine 10 MG capsule    Commonly known as: PROCARDIA    Take 1 capsule (10 mg total) by mouth 3 (three) times daily before meals.    omeprazole 20 MG capsule    Commonly known as: PRILOSEC    Take 20 mg by mouth daily as needed (for acid reflux).     No Known Allergies      Follow-up Information    Follow up with Provider Not In System.       Follow up with Lina Sar, MD In 1 week.    Specialty: Gastroenterology    Contact information:    [address]  [place] [address]  [phone]      The results of significant diagnostics from this hospitalization (including imaging, microbiology, ancillary and laboratory) are listed below for reference.   Significant Diagnostic Studies:   Dg Esophagus  08/07/2013 CLINICAL DATA: Difficulty swallowing. Status post endoscopy 08/06/2013. EXAM: ESOPHOGRAM/BARIUM SWALLOW TECHNIQUE: Single contrast examination was performed using water-soluble followed by thin barium. COMPARISON: None. FLUOROSCOPY TIME: 1 min, 2 seconds FINDINGS: The esophagus is markedly dilated with a beaked appearance at the gastroesophageal junction. Contrast does pass into the stomach but the distal esophagus does not open normally. A 13 mm barium tablet became lodged at the gastroesophageal junction and would not pass into the stomach. IMPRESSION: Appearance of esophagus consistent with achalasia. As above, a 13 mm barium tablet lodged at the gastroesophageal junction and did not pass into the stomach. Electronically Signed By: Drusilla Kanner M.D. On: 08/07/2013 09:56  Dg Chest Port 1 View  08/06/2013 CLINICAL DATA: Difficulty swallowing. EXAM: PORTABLE CHEST - 1 VIEW COMPARISON: None. FINDINGS: The lungs are well-aerated and clear. There is no evidence of focal opacification, pleural effusion or pneumothorax. The cardiomediastinal silhouette is within normal limits. No acute osseous abnormalities are seen. IMPRESSION: No acute cardiopulmonary process seen. Electronically Signed By: Roanna Raider M.D. On: 08/06/2013 05:19  Microbiology:  No results found for this or any previous visit (from the past 240 hour(s)).  Labs:  Basic Metabolic Panel:  Recent Labs  Lab  08/05/13 1734  08/07/13  0358   NA  142  142   K  4.3  3.2*   CL  105  107   CO2  22  23   GLUCOSE  96  102*   BUN  7  4*   CREATININE  0.73  0.63   CALCIUM  9.3  8.2*   Liver Function Tests:  No results found for this basename: AST, ALT, ALKPHOS, BILITOT, PROT, ALBUMIN, in the last 168 hours  No results found for this basename: LIPASE, AMYLASE, in the last 168 hours  No results found for this basename: AMMONIA, in the last 168 hours  CBC:  Recent Labs  Lab  08/05/13 1734  08/07/13 0358   WBC  7.9   5.6   HGB  14.8  12.3   HCT  42.5  35.6*   MCV  87.8  87.0   PLT  212  196   Cardiac Enzymes:  No results found for this basename: CKTOTAL, CKMB, CKMBINDEX, TROPONINI, in the last 168 hours  BNP:  BNP (last 3 results)  No results found for this basename: PROBNP, in the last 8760 hours  CBG:  Recent Labs  Lab  08/06/13 1803  08/06/13 1910  08/06/13 2348  08/07/13 0621  08/07/13 1316   GLUCAP  61*  112*  80  93  100*   Signed:  BURIEV, ULUGBEK N  [facility]  08/07/2013, 2:03 PM

## 2014-01-26 ENCOUNTER — Telehealth: Payer: Self-pay | Admitting: *Deleted

## 2014-01-26 ENCOUNTER — Encounter (HOSPITAL_COMMUNITY): Payer: Self-pay

## 2014-01-26 ENCOUNTER — Ambulatory Visit (HOSPITAL_COMMUNITY)
Admission: RE | Admit: 2014-01-26 | Discharge: 2014-01-26 | Disposition: A | Discharge status: Home / Self Care | Payer: Medicaid - Out of State | Source: Ambulatory Visit | Attending: Internal Medicine | Admitting: Internal Medicine

## 2014-01-26 ENCOUNTER — Encounter (HOSPITAL_COMMUNITY): Payer: Medicaid - Out of State | Admitting: Certified Registered Nurse Anesthetist

## 2014-01-26 ENCOUNTER — Ambulatory Visit (HOSPITAL_COMMUNITY): Payer: Medicaid - Out of State | Admitting: Certified Registered Nurse Anesthetist

## 2014-01-26 ENCOUNTER — Encounter (HOSPITAL_COMMUNITY)
Admission: RE | Disposition: A | Discharge status: Home / Self Care | Payer: Self-pay | Source: Ambulatory Visit | Attending: Internal Medicine

## 2014-01-26 DIAGNOSIS — K220 Achalasia of cardia: Secondary | ICD-10-CM

## 2014-01-26 DIAGNOSIS — J45909 Unspecified asthma, uncomplicated: Secondary | ICD-10-CM | POA: Insufficient documentation

## 2014-01-26 DIAGNOSIS — R1310 Dysphagia, unspecified: Secondary | ICD-10-CM | POA: Insufficient documentation

## 2014-01-26 DIAGNOSIS — F17200 Nicotine dependence, unspecified, uncomplicated: Secondary | ICD-10-CM | POA: Insufficient documentation

## 2014-01-26 DIAGNOSIS — Z79899 Other long term (current) drug therapy: Secondary | ICD-10-CM | POA: Insufficient documentation

## 2014-01-26 DIAGNOSIS — K219 Gastro-esophageal reflux disease without esophagitis: Secondary | ICD-10-CM | POA: Insufficient documentation

## 2014-01-26 HISTORY — PX: BOTOX INJECTION: SHX5754

## 2014-01-26 HISTORY — PX: ESOPHAGOGASTRODUODENOSCOPY: SHX5428

## 2014-01-26 LAB — PREGNANCY, URINE: PREG TEST UR: NEGATIVE

## 2014-01-26 SURGERY — EGD (ESOPHAGOGASTRODUODENOSCOPY)
Anesthesia: Monitor Anesthesia Care

## 2014-01-26 MED ORDER — ONABOTULINUMTOXINA 100 UNITS IJ SOLR
100.0000 [IU] | Freq: Once | INTRAMUSCULAR | Status: AC
  Administered 2014-01-26: 100 [IU] via INTRAMUSCULAR
  Filled 2014-01-26: qty 100

## 2014-01-26 MED ORDER — SODIUM CHLORIDE 0.9 % IV SOLN: INTRAVENOUS | Status: DC

## 2014-01-26 MED ORDER — MIDAZOLAM HCL 2 MG/2ML IJ SOLN
INTRAMUSCULAR | Status: AC
  Filled 2014-01-26: qty 2

## 2014-01-26 MED ORDER — BUTAMBEN-TETRACAINE-BENZOCAINE 2-2-14 % EX AERO
INHALATION_SPRAY | CUTANEOUS | Status: DC | PRN
  Administered 2014-01-26: 2 via TOPICAL

## 2014-01-26 MED ORDER — PROPOFOL INFUSION 10 MG/ML OPTIME
INTRAVENOUS | Status: DC | PRN
  Administered 2014-01-26: 160 ug/kg/min via INTRAVENOUS

## 2014-01-26 MED ORDER — ONDANSETRON HCL 4 MG/2ML IJ SOLN
INTRAMUSCULAR | Status: AC
  Filled 2014-01-26: qty 2

## 2014-01-26 MED ORDER — PROPOFOL 10 MG/ML IV BOLUS
INTRAVENOUS | Status: AC
  Filled 2014-01-26: qty 20

## 2014-01-26 MED ORDER — NICOTINE POLACRILEX 2 MG MT GUM: 2.0000 mg | CHEWING_GUM | OROMUCOSAL | Status: DC

## 2014-01-26 MED ORDER — MIDAZOLAM HCL 5 MG/5ML IJ SOLN
INTRAMUSCULAR | Status: DC | PRN
  Administered 2014-01-26 (×2): 2 mg via INTRAVENOUS

## 2014-01-26 MED ORDER — LIDOCAINE HCL (CARDIAC) 20 MG/ML IV SOLN
INTRAVENOUS | Status: AC
Start: 2014-01-26 — End: 2014-01-26
  Filled 2014-01-26: qty 5

## 2014-01-26 MED ORDER — NIFEDIPINE 10 MG PO CAPS: 10.0000 mg | ORAL_CAPSULE | Freq: Three times a day (TID) | ORAL | Status: DC

## 2014-01-26 MED ORDER — ONDANSETRON HCL 4 MG/2ML IJ SOLN
INTRAMUSCULAR | Status: DC | PRN
  Administered 2014-01-26: 4 mg via INTRAVENOUS

## 2014-01-26 MED ORDER — LIDOCAINE HCL (CARDIAC) 20 MG/ML IV SOLN
INTRAVENOUS | Status: DC | PRN
  Administered 2014-01-26: 100 mg via INTRAVENOUS

## 2014-01-26 MED ORDER — LACTATED RINGERS IV SOLN
INTRAVENOUS | Status: DC
  Administered 2014-01-26: 12:00:00 via INTRAVENOUS

## 2014-01-26 MED ORDER — KETAMINE HCL 10 MG/ML IJ SOLN
INTRAMUSCULAR | Status: DC | PRN
  Administered 2014-01-26: 20 mg via INTRAVENOUS

## 2014-01-26 NOTE — Anesthesia Preprocedure Evaluation (Addendum)
Anesthesia Evaluation  Patient identified by MRN, date of birth, ID band Patient awake    Reviewed: Allergy & Precautions, H&P , NPO status , Patient's Chart, lab work & pertinent test results  Airway Mallampati: II TM Distance: >3 FB Neck ROM: full    Dental no notable dental hx. (+) Teeth Intact, Dental Advisory Given   Pulmonary asthma , Current Smoker,  breath sounds clear to auscultation  Pulmonary exam normal       Cardiovascular Exercise Tolerance: Good negative cardio ROS  Rhythm:regular Rate:Normal     Neuro/Psych negative neurological ROS  negative psych ROS   GI/Hepatic Neg liver ROS, GERD-  Medicated and Controlled,achalasia   Endo/Other  negative endocrine ROS  Renal/GU negative Renal ROS  negative genitourinary   Musculoskeletal   Abdominal   Peds  Hematology negative hematology ROS (+)   Anesthesia Other Findings   Reproductive/Obstetrics negative OB ROS                          Anesthesia Physical Anesthesia Plan  ASA: II  Anesthesia Plan: MAC   Post-op Pain Management:    Induction:   Airway Management Planned: Simple Face Mask  Additional Equipment:   Intra-op Plan:   Post-operative Plan:   Informed Consent: I have reviewed the patients History and Physical, chart, labs and discussed the procedure including the risks, benefits and alternatives for the proposed anesthesia with the patient or authorized representative who has indicated his/her understanding and acceptance.   Dental Advisory Given  Plan Discussed with: CRNA and Surgeon  Anesthesia Plan Comments:        Anesthesia Quick Evaluation

## 2014-01-26 NOTE — Op Note (Addendum)
[facility] [address] [place], [id number]   ENDOSCOPY PROCEDURE REPORT  PATIENT: Theresa [name], [name]  MR#: [id number] BIRTHDATE: 02/09/1991 , 23  yrs. old GENDER: Female ENDOSCOPIST: Hart Carwinora M Eunie Lawn, MD REFERRED BY:  no PCP PROCEDURE DATE:  01/26/2014 PROCEDURE:  Savary dilation of esophagus and EGD w/ directed submucosal injection(s), any substance ASA CLASS:     Class I INDICATIONS:  achalasia diagnosed in Jan 2015, on EGD, Barium studies, s/p Savary dil, has been on Procardia ac but now recurrent dysphagia to liquids and solids, not ready for surgery.yet.Did not keep OV's.  Wants to try Botox. MEDICATIONS: MAC sedation, administered by CRNA TOPICAL ANESTHETIC: none  DESCRIPTION OF PROCEDURE: After the risks benefits and alternatives of the procedure were thoroughly explained, informed consent was obtained.  The    endoscope was introduced through the mouth and advanced to the second portion of the duodenum. Without limitations.  The instrument was slowly withdrawn as the mucosa was fully examined.      Esophagus: esophageal mucosa appeared normal throughtout the esophagus, the lumen was not dilated, there was moderate amount of clear thick secretions  retained in distal esophagus, the LES was closed and tight, the endoscope had to push against resistance to advance into the stomach Stomach: normal body and antrum,  no retained secretions Duodenum: normal duodenal bulb and descending duodenum Savary dilators 16mm, 19 and 20 mm passed through the esophagua with mild resistance. There was no blood on the dilator Endoscope was  reintroduced into the stomach and pulled back  into the LES. Botox was injected submucosally 25 units/ quadrant x 4 quadrants. Total of 100 units injected. . Tolerated well.[ The scope was then withdrawn from the patient and the procedure completed.  COMPLICATIONS: There were no complications. ENDOSCOPIC IMPRESSION:  Achalasia,  s/p Savary dilation and Botox injection of 100u/4 quadrants RECOMMENDATIONS:  Pt will eventually require Heller myotomy but she has not kept her appointments and does not seem to be ready to be referred for surgery.  She will need manometry. She will continue Procardia. Depending on the response to Botox I will discuss with her referral for Heller myotomy, she would prefert to be treated locally  . Nicorette chewing gum prescribed  REPEAT EXAM:prn  eSigned:  Hart Carwinora M Mckenzie Bove, MD 01/26/2014 1:33 PM   CC:  PATIENT NAME:  Theresa [name], Raynette MR#: [id number]

## 2014-01-26 NOTE — Anesthesia Postprocedure Evaluation (Signed)
  Anesthesia Post-op Note  Patient: Theresa [name]  Procedure(s) Performed: Procedure(s) (LRB): ESOPHAGOGASTRODUODENOSCOPY (EGD) (N/A) BOTOX INJECTION (N/A)  Patient Location: PACU  Anesthesia Type: MAC  Level of Consciousness: awake and alert   Airway and Oxygen Therapy: Patient Spontanous Breathing  Post-op Pain: mild  Post-op Assessment: Post-op Vital signs reviewed, Patient's Cardiovascular Status Stable, Respiratory Function Stable, Patent Airway and No signs of Nausea or vomiting  Last Vitals:  Filed Vitals:   01/26/14 1320  BP:   Pulse: 86  Temp:   Resp: 20    Post-op Vital Signs: stable   Complications: No apparent anesthesia complications

## 2014-01-26 NOTE — Telephone Encounter (Signed)
Message copied by Richardson ChiquitoSMITH, [name] on Mon Jan 26, 2014  3:04 PM ------      Message from: Hart CarwinBRODIE, [name] M      Created: Mon Jan 26, 2014  2:26 PM      Regarding: prescription written       I have written prescription for Procardia 10mg  ,#270, 1 po tid ac and for Nocerette 2mg , #200, with included directions. [place] ------

## 2014-01-26 NOTE — Interval H&P Note (Signed)
History and Physical Interval Note:  01/26/2014 12:29 PM  Theresa [name]  has presented today for surgery, with the diagnosis of achalasia, EGD with BOTOX  The various methods of treatment have been discussed with the patient and family. After consideration of risks, benefits and other options for treatment, the patient has consented to  Procedure(s): ESOPHAGOGASTRODUODENOSCOPY (EGD) (N/A) BOTOX INJECTION (N/A) as a surgical intervention .  The patient's history has been reviewed, patient examined, no change in status, stable for surgery.  I have reviewed the patient's chart and labs.  Questions were answered to the patient's satisfaction.     Lina Sarora Ada Holness

## 2014-01-26 NOTE — Discharge Instructions (Signed)
Gastrointestinal Endoscopy °Care After °Refer to this sheet in the next few weeks. These instructions provide you with information on caring for yourself after your procedure. Your caregiver may also give you more specific instructions. Your treatment has been planned according to current medical practices, but problems sometimes occur. Call your caregiver if you have any problems or questions after your procedure. °HOME CARE INSTRUCTIONS °· If you were given medicine to help you relax (sedative), do not drive, operate machinery, or sign important documents for 24 hours. °· Avoid alcohol and hot or warm beverages for the first 24 hours after the procedure. °· Only take over-the-counter or prescription medicines for pain, discomfort, or fever as directed by your caregiver. You may resume taking your normal medicines unless your caregiver tells you otherwise. Ask your caregiver when you may resume taking medicines that may cause bleeding, such as aspirin, clopidogrel, or warfarin. °· You may return to your normal diet and activities on the day after your procedure, or as directed by your caregiver. Walking may help to reduce any bloated feeling in your abdomen. °· Drink enough fluids to keep your urine clear or pale yellow. °· You may gargle with salt water if you have a sore throat. °SEEK IMMEDIATE MEDICAL CARE IF: °· You have severe nausea or vomiting. °· You have severe abdominal pain, abdominal cramps that last longer than 6 hours, or abdominal swelling (distention). °· You have severe shoulder or back pain. °· You have trouble swallowing. °· You have shortness of breath, your breathing is shallow, or you are breathing faster than normal. °· You have a fever or a rapid heartbeat. °· You vomit blood or material that looks like coffee grounds. °· You have bloody, black, or tarry stools. °MAKE SURE YOU: °· Understand these instructions. °· Will watch your condition. °· Will get help right away if you are not doing  well or get worse. °Document Released: 02/15/2004 Document Revised: 01/02/2012 Document Reviewed: 10/03/2011 °ExitCare® Patient Information ©2015 [employer]. This information is not intended to replace advice given to you by your health care provider. Make sure you discuss any questions you have with your health care provider. ° °

## 2014-01-26 NOTE — Transfer of Care (Signed)
Immediate Anesthesia Transfer of Care Note  Patient: Bard HerbertQuashay Cathers  Procedure(s) Performed: Procedure(s) (LRB): ESOPHAGOGASTRODUODENOSCOPY (EGD) (N/A) BOTOX INJECTION (N/A)  Patient Location: PACU  Anesthesia Type: MAC  Level of Consciousness: sedated, patient cooperative and responds to stimulation  Airway & Oxygen Therapy: Patient Spontanous Breathing and Patient connected to face mask oxgen  Post-op Assessment: Report given to PACU RN and Post -op Vital signs reviewed and stable  Post vital signs: Reviewed and stable  Complications: No apparent anesthesia complications

## 2014-01-27 ENCOUNTER — Encounter (HOSPITAL_COMMUNITY): Payer: Self-pay | Admitting: Internal Medicine

## 2014-01-30 ENCOUNTER — Telehealth: Payer: Self-pay | Admitting: Internal Medicine

## 2014-01-30 MED ORDER — DICYCLOMINE HCL 20 MG PO TABS
20.0000 mg | ORAL_TABLET | Freq: Three times a day (TID) | ORAL | Status: DC
Start: 2014-01-30 — End: 2014-02-13

## 2014-01-30 MED ORDER — SUCRALFATE 1 GM/10ML PO SUSP: 1.0000 g | Freq: Four times a day (QID) | ORAL | Status: DC

## 2014-01-30 NOTE — Telephone Encounter (Signed)
I spoke with the patient and she reports that she is having pain in her chest.  Pain is intermittent and occurs about every 10 minutes.  She states it is a pressure type pain and is relieved by drinking cold drinks.  She is able to tolerate a diet.  She denies fever or other complaints.  Dr. Juanda ChanceBrodie she had an EGD with Botox injection on Monday 01/26/14.  Report is not in EPIC, I printed a copy.  Dr. Juanda ChanceBrodie please advise

## 2014-01-30 NOTE — Telephone Encounter (Signed)
Discussed with Dr. Juanda ChanceBrodie. Per Dr. Juanda ChanceBrodie please send in bentyl 20 mg 1 po qid and carafate 1 po qid.  She will call back for any additional questions or concerns

## 2014-02-04 ENCOUNTER — Telehealth: Payer: Self-pay | Admitting: Internal Medicine

## 2014-02-04 NOTE — Telephone Encounter (Signed)
Left message for patient to call back  

## 2014-02-05 NOTE — Telephone Encounter (Signed)
I have left message on pt's phone to consider referral to [facility], just for het to let me know if she agrees.

## 2014-02-05 NOTE — Telephone Encounter (Signed)
Spoke to patient. She again indicates that she is having severe chest pain every 5-10 minutes only relieved by drinking cold liquids. This has occurred since Botox injection. She has not picked up her nicorette gum yet and states that the bentyl is $200.00 out of pocket (she has no insurance coverage). Dr Juanda ChanceBrodie, please advise.Marland Kitchen..Marland Kitchen

## 2014-02-13 ENCOUNTER — Emergency Department (HOSPITAL_COMMUNITY)
Admission: EM | Admit: 2014-02-13 | Discharge: 2014-02-13 | Disposition: A | Discharge status: Home / Self Care | Payer: Medicaid - Out of State | Attending: Emergency Medicine | Admitting: Emergency Medicine

## 2014-02-13 ENCOUNTER — Emergency Department (HOSPITAL_COMMUNITY): Payer: Medicaid - Out of State

## 2014-02-13 ENCOUNTER — Encounter (HOSPITAL_COMMUNITY): Payer: Self-pay | Admitting: Emergency Medicine

## 2014-02-13 DIAGNOSIS — K228 Other specified diseases of esophagus: Secondary | ICD-10-CM | POA: Diagnosis not present

## 2014-02-13 DIAGNOSIS — R109 Unspecified abdominal pain: Secondary | ICD-10-CM | POA: Insufficient documentation

## 2014-02-13 DIAGNOSIS — F17200 Nicotine dependence, unspecified, uncomplicated: Secondary | ICD-10-CM | POA: Diagnosis not present

## 2014-02-13 DIAGNOSIS — Z8619 Personal history of other infectious and parasitic diseases: Secondary | ICD-10-CM | POA: Insufficient documentation

## 2014-02-13 DIAGNOSIS — Z79899 Other long term (current) drug therapy: Secondary | ICD-10-CM | POA: Diagnosis not present

## 2014-02-13 DIAGNOSIS — E669 Obesity, unspecified: Secondary | ICD-10-CM | POA: Diagnosis not present

## 2014-02-13 DIAGNOSIS — K2289 Other specified disease of esophagus: Secondary | ICD-10-CM | POA: Insufficient documentation

## 2014-02-13 DIAGNOSIS — Z9889 Other specified postprocedural states: Secondary | ICD-10-CM | POA: Diagnosis not present

## 2014-02-13 DIAGNOSIS — J45909 Unspecified asthma, uncomplicated: Secondary | ICD-10-CM | POA: Diagnosis not present

## 2014-02-13 LAB — CBC WITH DIFFERENTIAL/PLATELET
BASOS ABS: 0 10*3/uL (ref 0.0–0.1)
BASOS PCT: 0 % (ref 0–1)
EOS PCT: 5 % (ref 0–5)
Eosinophils Absolute: 0.3 10*3/uL (ref 0.0–0.7)
HEMATOCRIT: 41.5 % (ref 36.0–46.0)
Hemoglobin: 13.9 g/dL (ref 12.0–15.0)
Lymphocytes Relative: 33 % (ref 12–46)
Lymphs Abs: 1.9 10*3/uL (ref 0.7–4.0)
MCH: 29.4 pg (ref 26.0–34.0)
MCHC: 33.5 g/dL (ref 30.0–36.0)
MCV: 87.7 fL (ref 78.0–100.0)
MONO ABS: 0.4 10*3/uL (ref 0.1–1.0)
Monocytes Relative: 7 % (ref 3–12)
Neutro Abs: 3.2 10*3/uL (ref 1.7–7.7)
Neutrophils Relative %: 55 % (ref 43–77)
Platelets: 222 10*3/uL (ref 150–400)
RBC: 4.73 MIL/uL (ref 3.87–5.11)
RDW: 13.3 % (ref 11.5–15.5)
WBC: 5.7 10*3/uL (ref 4.0–10.5)

## 2014-02-13 LAB — BASIC METABOLIC PANEL
Anion gap: 11 (ref 5–15)
BUN: 9 mg/dL (ref 6–23)
CALCIUM: 9.2 mg/dL (ref 8.4–10.5)
CO2: 26 meq/L (ref 19–32)
CREATININE: 0.58 mg/dL (ref 0.50–1.10)
Chloride: 102 mEq/L (ref 96–112)
GFR calc Af Amer: >90 mL/min (ref >90)
GFR calc non Af Amer: >90 mL/min (ref >90)
GLUCOSE: 76 mg/dL (ref 70–99)
Potassium: 3.8 mEq/L (ref 3.7–5.3)
Sodium: 139 mEq/L (ref 137–147)

## 2014-02-13 LAB — I-STAT TROPONIN, ED: Troponin i, poc: 0 ng/mL (ref 0.00–0.08)

## 2014-02-13 MED ORDER — GI COCKTAIL ~~LOC~~
30.0000 mL | Freq: Once | ORAL | Status: DC
  Filled 2014-02-13: qty 30

## 2014-02-13 MED ORDER — ACETAMINOPHEN-CODEINE 120-12 MG/5ML PO SOLN: 10.0000 mL | ORAL | Status: DC | PRN

## 2014-02-13 MED ORDER — SUCRALFATE 1 G PO TABS: 1.0000 g | ORAL_TABLET | Freq: Three times a day (TID) | ORAL | Status: DC

## 2014-02-13 NOTE — ED Notes (Signed)
Pt in stating that she has achalasia and that on 7/13 she had an endoscopy with a botox injection into her lower sphincter of her esophogas states that since that time she has been having intermittent substernal pain, states pain is better when drinking cool water, no pain with eating

## 2014-02-13 NOTE — ED Provider Notes (Signed)
Medical screening examination/treatment/procedure(s) were performed by non-physician practitioner and as supervising physician I was immediately available for consultation/collaboration.   Linwood DibblesJon Rivers Gassmann, MD 02/13/14 [phone]

## 2014-02-13 NOTE — Discharge Instructions (Signed)
Return here as needed.  Followup with your GI doctor, soon as possible.  Increase your fluid intake

## 2014-02-13 NOTE — ED Provider Notes (Signed)
CSN: [id number]     Arrival date & time 02/13/14  1248 History   First MD Initiated Contact with Patient 02/13/14 1555     Chief Complaint  Patient presents with  . Abdominal Pain  . Chest Pain     (Consider location/radiation/quality/duration/timing/severity/associated sxs/prior Treatment) HPI Patient presents to the emergency department with intermittent pain in the mid chest region.  The patient, states she's had this previously, do to achalasia, and Botox injection.  She states since the Botox, injection she's had problems with discomfort in the area.  The patient, states, that she can swallow without difficulty and has been eating without issue.  The patient denies nausea, vomiting, diarrhea, abdominal pain, weakness, dizziness, headache, blurred vision, back pain, dysuria, fever, or syncope.  The patient, states she did not take any medications prior to arrival.  She states she is supposed to be taking Bentyl, but cannot afford this medication.  Patient, states nothing seems to make her condition, better or worse Past Medical History  Diagnosis Date  . Asthma   . Left fibular fracture 12/2011    referred by ED to outpt ortho. non-surgical mgt.   . Chlamydia age 28  . Obesity (BMI 30-39.9) 07/2013  . Achalasia    Past Surgical History  Procedure Laterality Date  . Cesarean section  12/2008  . Tonsillectomy  07/2007    Dr Narda Bonds  . Esophagogastroduodenoscopy (egd) with esophageal dilation N/A 08/06/2013    Procedure: ESOPHAGOGASTRODUODENOSCOPY (EGD) WITH ESOPHAGEAL DILATION;  Surgeon: Hart Carwin, MD;  Location: [facility] ENDOSCOPY;  Service: Endoscopy;  Laterality: N/A;  . Esophageal manometry N/A 08/18/2013    Procedure: ESOPHAGEAL MANOMETRY (EM);  Surgeon: Hart Carwin, MD;  Location: [facility] ENDOSCOPY;  Service: Endoscopy;  Laterality: N/A;  . Esophagogastroduodenoscopy N/A 01/26/2014    Procedure: ESOPHAGOGASTRODUODENOSCOPY (EGD);  Surgeon: Hart Carwin, MD;  Location: [facility] ENDOSCOPY;   Service: Endoscopy;  Laterality: N/A;  . Botox injection N/A 01/26/2014    Procedure: BOTOX INJECTION;  Surgeon: Hart Carwin, MD;  Location: [facility] ENDOSCOPY;  Service: Endoscopy;  Laterality: N/A;   Family History  Problem Relation Age of Onset  . Hypertension Other   . Colon cancer Neg Hx   . Esophageal cancer Neg Hx   . Rectal cancer Neg Hx   . Stomach cancer Neg Hx    History  Substance Use Topics  . Smoking status: Current Every Day Smoker -- 0.25 packs/day    Types: Cigarettes  . Smokeless tobacco: Never Used  . Alcohol Use: Yes     Comment: occassionally   OB History   Grav Para Term Preterm Abortions TAB SAB Ect Mult Living   1 1 1       1      Review of Systems   All other systems negative except as documented in the HPI. All pertinent positives and negatives as reviewed in the HPI. Allergies  Review of patient's allergies indicates no known allergies.  Home Medications   Prior to Admission medications   Medication Sig Start Date End Date Taking? Authorizing Provider  NIFEdipine (PROCARDIA) 10 MG capsule Take 10 mg by mouth 3 (three) times daily before meals. 01/26/14  Yes Hart Carwin, MD   BP 106/60  Pulse 59  Temp(Src) 98.5 F (36.9 C) (Oral)  Resp 14  SpO2 100%  LMP 01/27/2014 Physical Exam  Nursing note and vitals reviewed. Constitutional: She is oriented to person, place, and time. She appears well-developed and well-nourished. No distress.  HENT:  Head: Normocephalic and atraumatic.  Mouth/Throat: Oropharynx is clear and moist.  Eyes: Pupils are equal, round, and reactive to light.  Neck: Normal range of motion. Neck supple.  Cardiovascular: Normal rate, regular rhythm and normal heart sounds.  Exam reveals no gallop and no friction rub.   No murmur heard. Pulmonary/Chest: Effort normal and breath sounds normal. No respiratory distress.  Abdominal: Soft. Bowel sounds are normal. She exhibits no distension. There is no tenderness.  Neurological: She  is alert and oriented to person, place, and time. She exhibits normal muscle tone. Coordination normal.  Skin: Skin is warm and dry. No rash noted. No erythema.    ED Course  Procedures (including critical care time) Labs Review Labs Reviewed  BASIC METABOLIC PANEL  CBC WITH DIFFERENTIAL  Rosezena SensorI-STAT TROPOININ, ED    Imaging Review Dg Chest 2 View  02/13/2014   CLINICAL DATA:  Recent endoscopy with chest pain  EXAM: CHEST  2 VIEW  COMPARISON:  08/06/2013  FINDINGS: The heart size and mediastinal contours are within normal limits. Both lungs are clear. The visualized skeletal structures are unremarkable.  IMPRESSION: No active cardiopulmonary disease.   Electronically Signed   By: Alcide CleverMark  Lukens M.D.   On: 02/13/2014 17:10    Patient isn't currently having any pain, and has not had any well.  Here in the emergency department.  The patient is, advised she'll need to followup with Dr. Juanda ChanceBrodie, if this could be related to her previous issues.  The patient is advised that she'll need to return here for any worsening in her condition.  The patient is advised to increase her fluid intake    Carlyle DollyChristopher W Staisha Winiarski, PA-C 02/13/14 1749

## 2014-05-18 ENCOUNTER — Encounter (HOSPITAL_COMMUNITY): Payer: Self-pay | Admitting: Emergency Medicine

## 2014-07-17 ENCOUNTER — Emergency Department (HOSPITAL_COMMUNITY)
Admission: EM | Admit: 2014-07-17 | Discharge: 2014-07-17 | Disposition: A | Discharge status: Home / Self Care | Payer: BC Managed Care – PPO | Attending: Emergency Medicine | Admitting: Emergency Medicine

## 2014-07-17 ENCOUNTER — Encounter (HOSPITAL_COMMUNITY): Payer: Self-pay | Admitting: Emergency Medicine

## 2014-07-17 ENCOUNTER — Emergency Department (HOSPITAL_COMMUNITY): Payer: BC Managed Care – PPO

## 2014-07-17 DIAGNOSIS — Y9389 Activity, other specified: Secondary | ICD-10-CM | POA: Diagnosis not present

## 2014-07-17 DIAGNOSIS — S93402A Sprain of unspecified ligament of left ankle, initial encounter: Secondary | ICD-10-CM | POA: Insufficient documentation

## 2014-07-17 DIAGNOSIS — Y9289 Other specified places as the place of occurrence of the external cause: Secondary | ICD-10-CM | POA: Diagnosis not present

## 2014-07-17 DIAGNOSIS — W19XXXA Unspecified fall, initial encounter: Secondary | ICD-10-CM | POA: Insufficient documentation

## 2014-07-17 DIAGNOSIS — Z8781 Personal history of (healed) traumatic fracture: Secondary | ICD-10-CM | POA: Insufficient documentation

## 2014-07-17 DIAGNOSIS — Z8619 Personal history of other infectious and parasitic diseases: Secondary | ICD-10-CM | POA: Diagnosis not present

## 2014-07-17 DIAGNOSIS — Z79899 Other long term (current) drug therapy: Secondary | ICD-10-CM | POA: Insufficient documentation

## 2014-07-17 DIAGNOSIS — Z8719 Personal history of other diseases of the digestive system: Secondary | ICD-10-CM | POA: Diagnosis not present

## 2014-07-17 DIAGNOSIS — J45909 Unspecified asthma, uncomplicated: Secondary | ICD-10-CM | POA: Diagnosis not present

## 2014-07-17 DIAGNOSIS — Y998 Other external cause status: Secondary | ICD-10-CM | POA: Diagnosis not present

## 2014-07-17 DIAGNOSIS — S8992XA Unspecified injury of left lower leg, initial encounter: Secondary | ICD-10-CM | POA: Diagnosis present

## 2014-07-17 DIAGNOSIS — E669 Obesity, unspecified: Secondary | ICD-10-CM | POA: Insufficient documentation

## 2014-07-17 DIAGNOSIS — S8392XA Sprain of unspecified site of left knee, initial encounter: Secondary | ICD-10-CM | POA: Insufficient documentation

## 2014-07-17 MED ORDER — HYDROCODONE-ACETAMINOPHEN 5-325 MG PO TABS
1.0000 | ORAL_TABLET | Freq: Once | ORAL | Status: AC
  Administered 2014-07-17: 1 via ORAL
  Filled 2014-07-17: qty 1

## 2014-07-17 MED ORDER — IBUPROFEN 800 MG PO TABS: 800.0000 mg | ORAL_TABLET | Freq: Three times a day (TID) | ORAL | Status: DC | PRN

## 2014-07-17 NOTE — ED Notes (Signed)
Pt w/ overwhelming aroma of marijuana reports falling this morning while she was intoxicated.  C/o lt foot, ankle, and knee pain.

## 2014-07-17 NOTE — Discharge Instructions (Signed)

## 2014-07-17 NOTE — ED Notes (Signed)
Pt in wheelchair to room.

## 2014-07-17 NOTE — ED Provider Notes (Signed)
CSN: [id number]     Arrival date & time 07/17/14  1502 History  This chart was scribed for Fayrene Helper, PA-C, working with Suzi Roots, MD by Elon Spanner, ED Scribe. This patient was seen in room WTR6/WTR6 and the patient's care was started at 3:41 PM.  Chief Complaint  Patient presents with  . Foot Pain  . Knee Pain   Patient is a 24 y.o. female presenting with knee pain. The history is provided by the patient. No language interpreter was used.  Knee Pain  HPI Comments: Theresa [name] is a 24 y.o. female who presents to the Emergency Department complaining of left foot and knee pani onset yesteday.  Patient reports she was consuming alcohol last night and fell onto her left knee but doesn't remember the mechanism or how she injured her current area of complaints.  She reports the pain is aggravated by touch and movement.  She reports she is unable to fully extend the knee .  Patient has iced, elevated and taken ibuprofen without relief.  Patient denies head trauma, LOC.  Patient denies previous knee injury but has a history of left ankle.  Patient reports she is not sure if she is currently pregnant but has been trying to become pregnant.    Past Medical History  Diagnosis Date  . Asthma   . Left fibular fracture 12/2011    referred by ED to outpt ortho. non-surgical mgt.   . Chlamydia age 60  . Obesity (BMI 30-39.9) 07/2013  . Achalasia    Past Surgical History  Procedure Laterality Date  . Cesarean section  12/2008  . Tonsillectomy  07/2007    Dr Narda Bonds  . Esophagogastroduodenoscopy (egd) with esophageal dilation N/A 08/06/2013    Procedure: ESOPHAGOGASTRODUODENOSCOPY (EGD) WITH ESOPHAGEAL DILATION;  Surgeon: Hart Carwin, MD;  Location: [facility] ENDOSCOPY;  Service: Endoscopy;  Laterality: N/A;  . Esophageal manometry N/A 08/18/2013    Procedure: ESOPHAGEAL MANOMETRY (EM);  Surgeon: Hart Carwin, MD;  Location: [facility] ENDOSCOPY;  Service: Endoscopy;  Laterality: N/A;  .  Esophagogastroduodenoscopy N/A 01/26/2014    Procedure: ESOPHAGOGASTRODUODENOSCOPY (EGD);  Surgeon: Hart Carwin, MD;  [facility];  Service: Endoscopy;  Laterality: N/A;  . Botox injection N/A 01/26/2014    Procedure: BOTOX INJECTION;  Surgeon: Hart Carwin, MD;  Location: [facility] ENDOSCOPY;  Service: Endoscopy;  Laterality: N/A;   Family History  Problem Relation Age of Onset  . Hypertension Other   . Colon cancer Neg Hx   . Esophageal cancer Neg Hx   . Rectal cancer Neg Hx   . Stomach cancer Neg Hx    History  Substance Use Topics  . Smoking status: Current Every Day Smoker -- 0.25 packs/day    Types: Cigarettes  . Smokeless tobacco: Never Used  . Alcohol Use: Yes     Comment: occassionally   OB History    Gravida Para Term Preterm AB TAB SAB Ectopic Multiple Living   Review of Systems  Musculoskeletal: Positive for arthralgias.      Allergies  Review of patient's allergies indicates no known allergies.  Home Medications   Prior to Admission medications   Medication Sig Start Date End Date Taking? Authorizing Provider  acetaminophen-codeine 120-12 MG/5ML solution Take 10 mLs by mouth every 4 (four) hours as needed for moderate pain. 02/13/14   Jamesetta Orleans Lawyer, PA-C  NIFEdipine (PROCARDIA) 10 MG capsule Take  10 mg by mouth 3 (three) times daily before meals. 01/26/14   Hart Carwin, MD  sucralfate (CARAFATE) 1 G tablet Take 1 tablet (1 g total) by mouth 4 (four) times daily -  with meals and at bedtime. 02/13/14   Jamesetta Orleans Lawyer, PA-C   BP 112/84 mmHg  Pulse 103  Temp(Src) 98.4 F (36.9 C) (Oral)  Resp 18  SpO2 100% Physical Exam  Constitutional: She is oriented to person, place, and time. She appears well-developed and well-nourished. No distress.  HENT:  Head: Normocephalic and atraumatic.  Eyes: Conjunctivae and EOM are normal.  Neck: Neck supple. No tracheal deviation present.  Cardiovascular: Normal rate.    Pulmonary/Chest: Effort normal. No respiratory distress.  Musculoskeletal: Normal range of motion.  Left knee TTP medial joint lin without crepitus or step-off.  No obvious effusion noted.  Able to flex and extend with pain.  Negative anterior and posterior drawer test.  Pain with varus and valgus maneuver. Left ankle pain with plantar flexion and foot eversion.  Pedal pulses palpable.  Normal sensation throughout.   Neurological: She is alert and oriented to person, place, and time.  Skin: Skin is warm and dry.  Psychiatric: She has a normal mood and affect. Her behavior is normal.  Nursing note and vitals reviewed.   ED Course  Procedures (including critical care time)  DIAGNOSTIC STUDIES: Oxygen Saturation is 100% on RA, normal by my interpretation.    COORDINATION OF CARE: Patient injured her left knee and left ankle after parting her last night. She is unable to bear weight at this time. She has focal point tenderness to her left knee medially and mild tenderness to the dorsums of her left ankle. X-ray of both left knee and left ankle without any acute fracture or dislocation. I suspect either contusion or sprain. Knee sleeves and crutches provided for support. Rice therapy discussed. Orthopedic referral given as needed. Return precautions discussed. Patient is neurovascularly intact and stable for discharge.   Labs Review Labs Reviewed - No data to display  Imaging Review Dg Ankle Complete Left  07/17/2014   CLINICAL DATA:  Fall today.  LEFT ankle pain.  Initial encounter.  EXAM: LEFT ANKLE COMPLETE - 3+ VIEW  COMPARISON:  01/13/2012.  FINDINGS: There is distortion of the distal fibula compatible with a healed old distal fibula fracture demonstrated on prior plain films. The ankle mortise is congruent. Alignment of the ankle is anatomic. No acute fracture. Soft tissues are within normal limits. Talar dome intact.  IMPRESSION: 1. No acute abnormality. 2. Healed distal fibula fracture.    Electronically Signed   By: Andreas Newport M.D.   On: 07/17/2014 16:12   Dg Knee Complete 4 Views Left  07/17/2014   CLINICAL DATA:  Initial encounter for fall directly on left knee earlier today. Pain and swelling.  EXAM: LEFT KNEE - COMPLETE 4+ VIEW  COMPARISON:  None.  FINDINGS: There is no evidence of fracture, dislocation, or joint effusion. There is no evidence of arthropathy or other focal bone abnormality. Soft tissues are unremarkable.  IMPRESSION: Negative.   Electronically Signed   By: Kennith Center M.D.   On: 07/17/2014 16:11     EKG Interpretation None      MDM   Final diagnoses:  Left knee sprain, initial encounter  Left ankle sprain, initial encounter    BP 112/84 mmHg  Pulse 103  Temp(Src) 98.4 F (36.9 C) (Oral)  Resp 18  SpO2 100%  LMP 06/29/2014  I have reviewed nursing notes and vital signs. I personally reviewed the imaging tests through PACS system  I reviewed available ER/hospitalization records thought the EMR   I personally performed the services described in this documentation, which was scribed in my presence. The recorded information has been reviewed and is accurate.     Fayrene Helper, PA-C 07/17/14 1634  Suzi Roots, MD 07/18/14 [phone]

## 2014-07-17 NOTE — ED Notes (Signed)
Ortho tech called for knee sleeve and crutches.

## 2014-07-17 NOTE — ED Notes (Signed)
Pt returned from X-ray in wheelchair.

## 2014-07-22 ENCOUNTER — Emergency Department (HOSPITAL_COMMUNITY)
Admission: EM | Admit: 2014-07-22 | Discharge: 2014-07-22 | Disposition: A | Discharge status: Home / Self Care | Payer: BLUE CROSS/BLUE SHIELD | Attending: Emergency Medicine | Admitting: Emergency Medicine

## 2014-07-22 ENCOUNTER — Encounter (HOSPITAL_COMMUNITY): Payer: Self-pay | Admitting: Family Medicine

## 2014-07-22 DIAGNOSIS — Z23 Encounter for immunization: Secondary | ICD-10-CM

## 2014-07-22 DIAGNOSIS — Z8719 Personal history of other diseases of the digestive system: Secondary | ICD-10-CM | POA: Diagnosis not present

## 2014-07-22 DIAGNOSIS — J45909 Unspecified asthma, uncomplicated: Secondary | ICD-10-CM | POA: Insufficient documentation

## 2014-07-22 DIAGNOSIS — Z8619 Personal history of other infectious and parasitic diseases: Secondary | ICD-10-CM | POA: Diagnosis not present

## 2014-07-22 DIAGNOSIS — Z8781 Personal history of (healed) traumatic fracture: Secondary | ICD-10-CM | POA: Insufficient documentation

## 2014-07-22 DIAGNOSIS — Z720 Tobacco use: Secondary | ICD-10-CM | POA: Diagnosis not present

## 2014-07-22 DIAGNOSIS — E669 Obesity, unspecified: Secondary | ICD-10-CM | POA: Diagnosis not present

## 2014-07-22 MED ORDER — TETANUS-DIPHTH-ACELL PERTUSSIS 5-2.5-18.5 LF-MCG/0.5 IM SUSP
0.5000 mL | Freq: Once | INTRAMUSCULAR | Status: AC
  Administered 2014-07-22: 0.5 mL via INTRAMUSCULAR
  Filled 2014-07-22: qty 0.5

## 2014-07-22 NOTE — Discharge Instructions (Signed)
Call for a follow up appointment with a Family or Primary Care Provider.    Emergency Department Resource Guide 1) Find a Doctor and Pay Out of Pocket Although you won't have to find out who is covered by your insurance plan, it is a good idea to ask around and get recommendations. You will then need to call the office and see if the doctor you have chosen will accept you as a new patient and what types of options they offer for patients who are self-pay. Some doctors offer discounts or will set up payment plans for their patients who do not have insurance, but you will need to ask so you aren't surprised when you get to your appointment.  2) Contact Your Local Health Department Not all health departments have doctors that can see patients for sick visits, but many do, so it is worth a call to see if yours does. If you don't know where your local health department is, you can check in your phone book. The CDC also has a tool to help you locate your state's health department, and many state websites also have listings of all of their local health departments.  3) Find a Walk-in Clinic If your illness is not likely to be very severe or complicated, you may want to try a walk in clinic. These are popping up all over the country in pharmacies, drugstores, and shopping centers. They're usually staffed by nurse practitioners or physician assistants that have been trained to treat common illnesses and complaints. They're usually fairly quick and inexpensive. However, if you have serious medical issues or chronic medical problems, these are probably not your best option.  No Primary Care Doctor: - Call Health Connect at  [phone] - they can help you locate a primary care doctor that  accepts your insurance, provides certain services, etc. - Physician Referral Service- [phone]  Chronic Pain Problems: Organization         Address  Phone   Notes  [facility]  [phone]  Patients need to be referred by their primary care doctor.   Medication Assistance: Organization         Address  Phone   Notes  [facility] Medication [facility] [address] [place] [place], [place] [address] [phone] --Must be a resident of [facility] -- Must have NO insurance coverage whatsoever (no Medicaid/ Medicare, etc.) -- The pt. MUST have a primary care doctor that directs their care regularly and follows them in the community   MedAssist  [phone]   [employer]  [phone]    Agencies that provide inexpensive medical care: Organization         Address  Phone   Notes  [facility]  [phone]   [facility]    [phone]   [facility] [address] [place], [place] [address] [phone]   [facility] [address] [place] [place] [phone]   Planned Parenthood    [phone]   [facility]    [phone]   [facility]  [address], [place] Phone:  [phone], Fax:  [phone] Hours of Operation:  9 am - 6 pm, M-F.  Also accepts Medicaid/Medicare and self-pay.  [facility]  [address], [place] Phone: [phone], Fax: [phone]. Hours of Operation:  8:30  am - 5:30 pm, M-F.  Also accepts Medicaid and self-pay.  [facility] [address] [place] Phone: [phone]   [place], [place], [place] [phone], Ext. [phone] Mondays & Thursdays: 7-9 AM.  First 15 patients are seen on a first come, first serve basis.    [place] Providers:  Organization         Address  Phone   Notes  [facility] [address], [place] [phone] Also accepts self-pay patients.  [facility] [address], [place]  [phone]   [place], [address], [place] [phone]   [facility] [address] [place] [phone]   Lucianne Lei [address], [place]   [phone] Only accepts [place] Access [place] patients after they have their name applied to their card.   Self-Pay (no insurance) in [facility]:  Organization         Address  Phone   Notes  Sickle Cell Patients, [facility] Internal Medicine [place] [phone]   [facility] [place] [phone]   [facility] [place]  [place], [place], [place] [phone]   [facility]/Dr. Osei-Bonsu  [address] [place] or [address], [place] [phone] Phone number for both [place] and [place] locations is the same.  [facility] and [facility] [address] [place] [phone]   [facility] [address], [place] or [address] [phone]   [facility] [address], [place] [phone], phone; [phone], fax Sees patients 1st and 3rd Saturday of every month.  Must not qualify for public or private insurance (i.e. Medicaid, Medicare, [place] Health Choice, Veterans' Benefits)  Household income should be no more than 200% of the poverty level The clinic cannot treat you if you are pregnant or think you are pregnant  Sexually transmitted diseases are not treated at the clinic.    Dental Care: Organization         Address  Phone  Notes  [facility] Department of [facility] [phone] Accepts children up to age 48 who are enrolled in [place] or [place]; pregnant women with a Medicaid card; and children who have applied for Medicaid or [place] Health Choice, but were  declined, whose parents can pay a reduced fee at time of service.  [facility] Department of [facility]  [address], [place] [phone] Accepts children up to age 26 who are enrolled in [place] or [place]; pregnant women with a Medicaid card; and children who have applied for Medicaid or [place] Health Choice, but were declined, whose parents can pay a reduced fee at time of service.  [facility] Access [facility]  [place] [phone] Patients are seen by appointment only. Walk-ins are not accepted. [place] will see patients 80 years of age and older. Monday - Tuesday (8am-5pm) Most Wednesdays (8:30-5pm) $30 per visit, cash only  [facility] Adult Dental Access PROGRAM  [address], [facility] [phone] Patients are seen by appointment only.  Walk-ins are not accepted. [place] will see patients 9 years of age and older. One Wednesday Evening (Monthly: Volunteer Based).  $30 per visit, cash only  [place]  [phone] for adults; Children under age 55, call Graduate Pediatric Dentistry at [phone]. Children aged 30-14, please call [phone] to request a pediatric application.  Dental services are provided in all areas of dental care including fillings, crowns and bridges, complete and partial dentures, implants, gum treatment, root canals, and extractions. Preventive care is also provided. Treatment is provided to both adults and children. Patients are selected via a lottery and there is often a waiting list.   [facility] [address] [place]  [phone] [email or web address]   [facility] [address] [place], [place] [phone], Ext. [phone] Second and Fourth Thursday of each month, opens at 6:30 AM; Clinic ends at 9 AM.  Patients are seen on a first-come first-served basis, and a limited number are seen during each clinic.    [facility]  [address] Hillard Danker [place], [place] [phone]   Eligibility Requirements You must have lived in [place], [place], or [place] counties for at least the last three months.   You cannot be eligible for state or federal sponsored [employer], including [employer], [place], or [employer].   You generally cannot be eligible for healthcare insurance through your employer.    How to apply: Eligibility screenings are held every Tuesday and Wednesday afternoon from 1:00 pm until 4:00 pm. You do not need an appointment for the interview!  [facility] [address] [place], [place]   [place]  [place] Department  [place]  [phone]    Behavioral Health Resources in the Community: Intensive Outpatient Programs Organization         Address  Phone  Notes  [place]. [address] [place], [place] [phone]   [facility] Outpatient [address] [place], [place]   ADS: [facility] [address] [place], [place]   [place] [address],  [place], [place] or [phone]   Substance Abuse Resources Organization         Address  Phone  Notes  Alcohol and Drug Services  [phone]   [place]  [phone]   [facility] [place]   Chinita Pester  [phone]   Residential & Outpatient Substance Abuse Program  [phone]   Psychological Services Organization         Address  Phone  Notes  [facility]  [place]  [phone]   [place] [address] [place] or [phone]    Mobile Crisis Teams Organization         Address  Phone  Notes  [facility], Mobile Crisis Care  Unit  [phone]   [facility]  [address] [place], [place]   [place] Levels [address] [place] [phone]    Self-Help/Support Groups Organization         Address  Phone             Notes  [place]. of [place] - variety of support groups  [place] Call for more information  Narcotics  Anonymous (NA), Caring Services [address], [place]  2 meetings at this location   Residential Sports administratorTreatment Programs Organization         Address  Phone  Notes  [facility] [address],    [place]  [phone]   [facility]  [address], [place] [phone], [place], [place] [phone]   [facility] [address], [place] [phone] Admissions: 8am-3pm M-F  [facility] [address]    [place], [place] [phone]   [facility] [address], [place], [place] [phone]   [facility] [address]  [place], [place] [phone]   [facility] - Intensive Outpatient [address], [place], [place] [phone]   [facility] [address]  [place], [place] [phone] or [phone]   [facility] Services (RTS) [address] [place], [place] [phone] Accepts Medicaid  [facility] [address]  [place] [phone] Substance Abuse/Addiction Treatment   [facility] Behavioral Health Resources Organization         Address  Phone  Notes  [facility]  [phone]) [phone]   Angie FavaJulie Brannon, PhD [address] [place], [place]   [phone] or [phone]   [facility]   [address] [place], [place] [phone](336) [phone]   [facility] [address], [place], [place] [phone](336) [phone] Insurance/Medicaid/sponsorship through [facility]  Faith and Families [address]                                     [place], [place] [phone](336) [phone] Therapy/tele-psych/case  [facility] [address]   [place], [place] [phone](336) [phone]    Dr. Lolly MustacheArfeen  [phone]   [facility]  [employer] [facility] 1) [address], [place] 2) [address], [place] 3)  [address], [place] [phone](336) [phone]) [phone]  [phone](336) [phone]   [facility] Child Abuse Hotline [phone](336) [phone] or [phone](336) [phone] (After Hours)

## 2014-07-22 NOTE — ED Notes (Signed)
Pt sts that she has [facility] and needs a tetanus shot.

## 2014-07-22 NOTE — ED Provider Notes (Signed)
CSN: [id number]     Arrival date & time 07/22/14  1540 History   First MD Initiated Contact with Patient 07/22/14 1550     Chief Complaint  Patient presents with  . tetanus    tetanus     (Consider location/radiation/quality/duration/timing/severity/associated sxs/prior Treatment) HPI Comments: Patient is a 24 year old female presenting to the emergency department with a chief compliant of Td update.  Reports multiple healed cuts on hands while at work. No active wounds.  Uncertain last Td. No PCP.  The history is provided by the patient. No language interpreter was used.    Past Medical History  Diagnosis Date  . Asthma   . Left fibular fracture 12/2011    referred by ED to outpt ortho. non-surgical mgt.   . Chlamydia age 24  . Obesity (BMI 30-39.9) 07/2013  . Achalasia    Past Surgical History  Procedure Laterality Date  . Cesarean section  12/2008  . Tonsillectomy  07/2007    Dr Narda Bondshris Newman  . Esophagogastroduodenoscopy (egd) with esophageal dilation N/A 08/06/2013    Procedure: ESOPHAGOGASTRODUODENOSCOPY (EGD) WITH ESOPHAGEAL DILATION;  Surgeon: Hart Carwinora M Brodie, MD;  Location: [facility] ENDOSCOPY;  Service: Endoscopy;  Laterality: N/A;  . Esophageal manometry N/A 08/18/2013    Procedure: ESOPHAGEAL MANOMETRY (EM);  Surgeon: Hart Carwinora M Brodie, MD;  Location: [facility] ENDOSCOPY;  Service: Endoscopy;  Laterality: N/A;  . Esophagogastroduodenoscopy N/A 01/26/2014    Procedure: ESOPHAGOGASTRODUODENOSCOPY (EGD);  Surgeon: Hart Carwinora M Brodie, MD;  [facility];  Service: Endoscopy;  Laterality: N/A;  . Botox injection N/A 01/26/2014    Procedure: BOTOX INJECTION;  Surgeon: Hart Carwinora M Brodie, MD;  Location: [facility] ENDOSCOPY;  Service: Endoscopy;  Laterality: N/A;   Family History  Problem Relation Age of Onset  . Hypertension Other   . Colon cancer Neg Hx   . Esophageal cancer Neg Hx   . Rectal cancer Neg Hx   . Stomach cancer Neg Hx    History  Substance Use Topics  . Smoking status: Current Every Day  Smoker -- 0.25 packs/day    Types: Cigarettes  . Smokeless tobacco: Never Used  . Alcohol Use: Yes     Comment: occassionally   OB History    Gravida Para Term Preterm AB TAB SAB Ectopic Multiple Living   1 1 1       1      Review of Systems  Skin: Negative for color change and wound.      Allergies  Review of patient's allergies indicates no known allergies.  Home Medications   Prior to Admission medications   Medication Sig Start Date End Date Taking? Authorizing Provider  acetaminophen-codeine 120-12 MG/5ML solution Take 10 mLs by mouth every 4 (four) hours as needed for moderate pain. Patient not taking: Reported on 07/17/2014 02/13/14   Jamesetta Orleanshristopher W Lawyer, PA-C  ibuprofen (ADVIL,MOTRIN) 800 MG tablet Take 1 tablet (800 mg total) by mouth every 8 (eight) hours as needed. 07/17/14   Fayrene HelperBowie Tran, PA-C  sucralfate (CARAFATE) 1 G tablet Take 1 tablet (1 g total) by mouth 4 (four) times daily -  with meals and at bedtime. Patient not taking: Reported on 07/17/2014 02/13/14   Jamesetta Orleanshristopher W Lawyer, PA-C   BP 110/55 mmHg  Temp(Src) 98.7 F (37.1 C) (Oral)  Resp 22  SpO2 99%  LMP 06/29/2014 Physical Exam  Constitutional: She is oriented to person, place, and time. She appears well-developed and well-nourished. No distress.  HENT:  Head: Normocephalic and atraumatic.  Neck:  Neck supple.  Pulmonary/Chest: Effort normal. No respiratory distress.  Neurological: She is alert and oriented to person, place, and time.  Skin: Skin is warm and dry. She is not diaphoretic.  Multiple healing cuts to hands, no active bleeding or scabs, no erythema or drainage.  Psychiatric: She has a normal mood and affect. Her behavior is normal.  Nursing note and vitals reviewed.   ED Course  Procedures (including critical care time) Labs Review Labs Reviewed - No data to display  Imaging Review No results found.   EKG Interpretation None      MDM   Final diagnoses:  Need for Tdap vaccination    Pt here for Td update. No wound repairs needed at this time.  No signs of infection.   Mellody Drown, PA-C 07/22/14 1605  Mirian Mo, MD 07/24/14 [phone]

## 2014-07-27 ENCOUNTER — Telehealth: Payer: Self-pay | Admitting: *Deleted

## 2014-07-27 NOTE — Telephone Encounter (Signed)
Pt called to get proof of tetanus shot faxed to [employer].  NCM completed request.

## 2014-07-31 ENCOUNTER — Telehealth: Payer: Self-pay | Admitting: Internal Medicine

## 2014-07-31 NOTE — Telephone Encounter (Signed)
[facility] for pneum[facility] of Achalasia. Do not send a recall letter.

## 2014-07-31 NOTE — Telephone Encounter (Signed)
Patient has a recall in for EGD. Looks like she is noncompliant and was asked about referral to [facility]. Should she have OV scheduled first?

## 2014-08-06 ENCOUNTER — Encounter (HOSPITAL_COMMUNITY): Payer: Self-pay

## 2014-08-06 ENCOUNTER — Emergency Department (HOSPITAL_COMMUNITY): Payer: BLUE CROSS/BLUE SHIELD

## 2014-08-06 ENCOUNTER — Emergency Department (HOSPITAL_COMMUNITY)
Admission: EM | Admit: 2014-08-06 | Discharge: 2014-08-07 | Disposition: A | Discharge status: Home / Self Care | Payer: BLUE CROSS/BLUE SHIELD | Attending: Emergency Medicine | Admitting: Emergency Medicine

## 2014-08-06 DIAGNOSIS — O23591 Infection of other part of genital tract in pregnancy, first trimester: Secondary | ICD-10-CM | POA: Insufficient documentation

## 2014-08-06 DIAGNOSIS — B373 Candidiasis of vulva and vagina: Secondary | ICD-10-CM | POA: Insufficient documentation

## 2014-08-06 DIAGNOSIS — O99511 Diseases of the respiratory system complicating pregnancy, first trimester: Secondary | ICD-10-CM | POA: Insufficient documentation

## 2014-08-06 DIAGNOSIS — E669 Obesity, unspecified: Secondary | ICD-10-CM | POA: Insufficient documentation

## 2014-08-06 DIAGNOSIS — B3731 Acute candidiasis of vulva and vagina: Secondary | ICD-10-CM

## 2014-08-06 DIAGNOSIS — F17210 Nicotine dependence, cigarettes, uncomplicated: Secondary | ICD-10-CM | POA: Insufficient documentation

## 2014-08-06 DIAGNOSIS — Z79899 Other long term (current) drug therapy: Secondary | ICD-10-CM | POA: Insufficient documentation

## 2014-08-06 DIAGNOSIS — Z3490 Encounter for supervision of normal pregnancy, unspecified, unspecified trimester: Secondary | ICD-10-CM

## 2014-08-06 DIAGNOSIS — O99211 Obesity complicating pregnancy, first trimester: Secondary | ICD-10-CM | POA: Insufficient documentation

## 2014-08-06 DIAGNOSIS — Z3A01 Less than 8 weeks gestation of pregnancy: Secondary | ICD-10-CM | POA: Diagnosis not present

## 2014-08-06 DIAGNOSIS — R1030 Lower abdominal pain, unspecified: Secondary | ICD-10-CM

## 2014-08-06 DIAGNOSIS — O99331 Smoking (tobacco) complicating pregnancy, first trimester: Secondary | ICD-10-CM | POA: Diagnosis not present

## 2014-08-06 DIAGNOSIS — Z792 Long term (current) use of antibiotics: Secondary | ICD-10-CM | POA: Insufficient documentation

## 2014-08-06 DIAGNOSIS — J45901 Unspecified asthma with (acute) exacerbation: Secondary | ICD-10-CM | POA: Insufficient documentation

## 2014-08-06 DIAGNOSIS — Z8719 Personal history of other diseases of the digestive system: Secondary | ICD-10-CM | POA: Diagnosis not present

## 2014-08-06 LAB — URINALYSIS, ROUTINE W REFLEX MICROSCOPIC
Bilirubin Urine: NEGATIVE
Glucose, UA: NEGATIVE mg/dL
Hgb urine dipstick: NEGATIVE
Ketones, ur: NEGATIVE mg/dL
Leukocytes, UA: NEGATIVE
Nitrite: NEGATIVE
PROTEIN: NEGATIVE mg/dL
SPECIFIC GRAVITY, URINE: 1.025 (ref 1.005–1.030)
UROBILINOGEN UA: 1 mg/dL (ref 0.0–1.0)
pH: 6 (ref 5.0–8.0)

## 2014-08-06 LAB — BASIC METABOLIC PANEL
ANION GAP: 8 (ref 5–15)
BUN: 9 mg/dL (ref 6–23)
CHLORIDE: 105 meq/L (ref 96–112)
CO2: 20 mmol/L (ref 19–32)
CREATININE: 0.53 mg/dL (ref 0.50–1.10)
Calcium: 8.5 mg/dL (ref 8.4–10.5)
GFR calc Af Amer: >90 mL/min (ref >90)
GFR calc non Af Amer: >90 mL/min (ref >90)
Glucose, Bld: 79 mg/dL (ref 70–99)
Potassium: 3.4 mmol/L — ABNORMAL LOW (ref 3.5–5.1)
Sodium: 133 mmol/L — ABNORMAL LOW (ref 135–145)

## 2014-08-06 LAB — CBC
HEMATOCRIT: 38.7 % (ref 36.0–46.0)
Hemoglobin: 13.5 g/dL (ref 12.0–15.0)
MCH: 30.8 pg (ref 26.0–34.0)
MCHC: 34.9 g/dL (ref 30.0–36.0)
MCV: 88.4 fL (ref 78.0–100.0)
PLATELETS: 166 10*3/uL (ref 150–400)
RBC: 4.38 MIL/uL (ref 3.87–5.11)
RDW: 12.9 % (ref 11.5–15.5)
WBC: 7.6 10*3/uL (ref 4.0–10.5)

## 2014-08-06 LAB — ABO/RH: ABO/RH(D): AB POS

## 2014-08-06 LAB — WET PREP, GENITAL
Trich, Wet Prep: NONE SEEN
WBC, Wet Prep HPF POC: NONE SEEN
YEAST WET PREP: NONE SEEN

## 2014-08-06 LAB — HCG, QUANTITATIVE, PREGNANCY: hCG, Beta Chain, Quant, S: 7801 m[IU]/mL — ABNORMAL HIGH (ref <5)

## 2014-08-06 LAB — POC URINE PREG, ED: Preg Test, Ur: POSITIVE — AB

## 2014-08-06 NOTE — ED Notes (Signed)
US at bedside at this time 

## 2014-08-06 NOTE — ED Notes (Signed)
Spoke to US, states that they are waiting for Hcg level to result. Spoke to lab, stated that they needed to re-run specimen d/t dilution. Pt made aware of delay.

## 2014-08-06 NOTE — ED Provider Notes (Signed)
CSN: [id number]     Arrival date & time 08/06/14  2031 History   First MD Initiated Contact with Patient 08/06/14 2041     Chief Complaint  Patient presents with  . Vaginal Discharge     (Consider location/radiation/quality/duration/timing/severity/associated sxs/prior Treatment) Patient is a 24 y.o. female presenting with vaginal discharge. The history is provided by the patient and medical records. No language interpreter was used.  Vaginal Discharge Associated symptoms: abdominal pain ( lower) and nausea   Associated symptoms: no dysuria, no fever and no vomiting      Theresa [name] is a 24 y.o. female  [phone] with a hx of asthma, achalasia presents to the Emergency Department complaining of gradual, persistent, progressively worsening vaginal discharge and vaginal pressure onset 3-4 days ago.  Pt reports she is sexually active with 1 female partner and no birth control.  Pt reports similar symptoms with BV.  Associated symptoms include nausea without vomiting and mild suprapubic abdominal pain described as cramping and rated at a 2/10.  Patient denies vaginal bleeding.  Pt reports she used a metronidazole vaginal cream 2 days ago to treat BV, but the symptoms persisted.  Pt denies fever, chills, headache, neck pain, chest pain, SOB, diarrhea, weakness, dizziness, syncope, dysuria, hematuria.  LMP: 06-29-14   Past Medical History  Diagnosis Date  . Asthma   . Left fibular fracture 12/2011    referred by ED to outpt ortho. non-surgical mgt.   . Chlamydia age 89  . Obesity (BMI 30-39.9) 07/2013  . Achalasia    Past Surgical History  Procedure Laterality Date  . Cesarean section  12/2008  . Tonsillectomy  07/2007    Dr Narda Bonds  . Esophagogastroduodenoscopy (egd) with esophageal dilation N/A 08/06/2013    Procedure: ESOPHAGOGASTRODUODENOSCOPY (EGD) WITH ESOPHAGEAL DILATION;  Surgeon: Hart Carwin, MD;  Location[facility]COPY;  Service: Endoscopy;  Laterality: N/A;  . Esophageal  manometry N/A 08/18/2013    Procedure: ESOPHAGEAL MANOMETRY (EM);  Surgeon: Hart Carwin, MD;  Location: [facility] ENDOSCOPY;  Service: Endoscopy;  Laterality: N/A;  . Esophagogastroduodenoscopy N/A 01/26/2014    Procedure: ESOPHAGOGASTRODUODENOSCOPY (EGD);  Surgeon: Hart Carwin, MD;  [facility];  Service: Endoscopy;  Laterality: N/A;  . Botox injection N/A 01/26/2014    Procedure: BOTOX INJECTION;  Surgeon: Hart Carwin, MD;  Location: [facility] ENDOSCOPY;  Service: Endoscopy;  Laterality: N/A;   Family History  Problem Relation Age of Onset  . Hypertension Other   . Colon cancer Neg Hx   . Esophageal cancer Neg Hx   . Rectal cancer Neg Hx   . Stomach cancer Neg Hx    History  Substance Use Topics  . Smoking status: Current Every Day Smoker -- 0.25 packs/day    Types: Cigarettes  . Smokeless tobacco: Never Used  . Alcohol Use: Yes     Comment: occassionally   OB History    Gravida Para Term Preterm AB TAB SAB Ectopic Multiple Living   Review of Systems  Constitutional: Negative for fever, diaphoresis, appetite change, fatigue and unexpected weight change.  HENT: Negative for mouth sores and trouble swallowing.   Respiratory: Negative for cough, chest tightness, shortness of breath, wheezing and stridor.   Cardiovascular: Negative for chest pain and palpitations.  Gastrointestinal: Positive for nausea and abdominal pain ( lower). Negative for vomiting, diarrhea, constipation, blood in stool, abdominal distention and rectal pain.  Genitourinary: Positive for  vaginal discharge. Negative for dysuria, urgency, frequency, hematuria, flank pain and difficulty urinating.  Musculoskeletal: Negative for back pain, neck pain and neck stiffness.  Skin: Negative for rash.  Neurological: Negative for weakness.  Hematological: Negative for adenopathy.  Psychiatric/Behavioral: Negative for confusion.  All other systems reviewed and are negative.     Allergies  Review of  patient's allergies indicates no known allergies.  Home Medications   Prior to Admission medications   Medication Sig Start Date End Date Taking? Authorizing Provider  ibuprofen (ADVIL,MOTRIN) 800 MG tablet Take 1 tablet (800 mg total) by mouth every 8 (eight) hours as needed. 07/17/14  Yes Fayrene HelperBowie Tran, PA-C  metroNIDAZOLE (METROGEL) 0.75 % vaginal gel Place 1 Applicatorful vaginally 2 (two) times daily.   Yes Historical Provider, MD  acetaminophen-codeine 120-12 MG/5ML solution Take 10 mLs by mouth every 4 (four) hours as needed for moderate pain. Patient not taking: Reported on 07/17/2014 02/13/14   Jamesetta Orleanshristopher W Lawyer, PA-C  nystatin 100000 UNITS vaginal tablet Place 1 tablet vaginally at bedtime. 08/07/14   Starsha Morning, PA-C  sucralfate (CARAFATE) 1 G tablet Take 1 tablet (1 g total) by mouth 4 (four) times daily -  with meals and at bedtime. Patient not taking: Reported on 07/17/2014 02/13/14   Jamesetta Orleanshristopher W Lawyer, PA-C   BP 117/58 mmHg  Pulse 83  Temp(Src) 98.3 F (36.8 C) (Oral)  Resp 18  SpO2 100%  LMP 07/30/2014 Physical Exam  Constitutional: She appears well-developed and well-nourished. No distress.  Awake, alert, nontoxic appearance  HENT:  Head: Normocephalic and atraumatic.  Mouth/Throat: Oropharynx is clear and moist. No oropharyngeal exudate.  Eyes: Conjunctivae are normal. No scleral icterus.  Neck: Normal range of motion. Neck supple.  Cardiovascular: Normal rate, regular rhythm, normal heart sounds and intact distal pulses.   No murmur heard. Pulmonary/Chest: Effort normal and breath sounds normal. No respiratory distress. She has no wheezes.  Equal chest expansion  Abdominal: Soft. Bowel sounds are normal. She exhibits no mass. There is no tenderness. There is no rebound and no guarding. Hernia confirmed negative in the right inguinal area and confirmed negative in the left inguinal area.  No CVA tenderness Abdomen soft and nontender  Genitourinary: Uterus  normal. No labial fusion. There is no rash, tenderness or lesion on the right labia. There is no rash, tenderness or lesion on the left labia. Uterus is not deviated, not enlarged, not fixed and not tender. Cervix exhibits no motion tenderness, no discharge and no friability. Right adnexum displays no mass, no tenderness and no fullness. Left adnexum displays no mass, no tenderness and no fullness. No erythema, tenderness or bleeding in the vagina. No foreign body around the vagina. No signs of injury around the vagina. Vaginal discharge (Thick, clumpy, bright white vaginal discharge in copious amounts, nonodorous) found.  Musculoskeletal: Normal range of motion. She exhibits no edema.  Lymphadenopathy:       Right: No inguinal adenopathy present.       Left: No inguinal adenopathy present.  Neurological: She is alert.  Speech is clear and goal oriented Moves extremities without ataxia  Skin: Skin is warm and dry. She is not diaphoretic. No erythema.  Psychiatric: She has a normal mood and affect.  Nursing note and vitals reviewed.   ED Course  Procedures (including critical care time) Labs Review Labs Reviewed  WET PREP, GENITAL - Abnormal; Notable for the following:    Clue Cells Wet Prep HPF POC RARE (*)    All other  components within normal limits  HCG, QUANTITATIVE, PREGNANCY - Abnormal; Notable for the following:    hCG, Beta Chain, Quant, S 7801 (*)    All other components within normal limits  BASIC METABOLIC PANEL - Abnormal; Notable for the following:    Sodium 133 (*)    Potassium 3.4 (*)    All other components within normal limits  POC URINE PREG, ED - Abnormal; Notable for the following:    Preg Test, Ur POSITIVE (*)    All other components within normal limits  URINALYSIS, ROUTINE W REFLEX MICROSCOPIC  CBC  RPR  HIV ANTIBODY (ROUTINE TESTING)  ABO/RH  GC/CHLAMYDIA PROBE AMP ([place])    Imaging Review US Ob Comp Less 14 Wks  08/07/2014   CLINICAL DATA:   Lower abdominal pain. Pregnant patient. Beta HCG level 7,801. Patient 5 weeks 4 days pregnant based on the last menstrual period.  EXAM: OBSTETRIC <14 WK [place] AND TRANSVAGINAL OB US  TECHNIQUE: Both transabdominal and transvaginal ultrasound examinations were performed for complete evaluation of the gestation as well as the maternal uterus, adnexal regions, and pelvic cul-de-sac. Transvaginal technique was performed to assess early pregnancy.  COMPARISON:  None.  FINDINGS: Intrauterine gestational sac: Visualized/normal in shape.  Yolk sac:  None  Embryo:  No  Cardiac Activity: Not applicable  MSD:  3  mm   5 w   6  d  [place] EDC: 04/03/2015  Maternal uterus/adnexae: No uterine mass. Uterus measures 7.9 cm x 4.9 cm x 6.3 cm. No subchronic hemorrhage. No endometrial fluid. Cervix unremarkable.  Right ovary is prominent measuring 5 cm x 3.5 cm x 3 cm. It contains an 18 mm cyst. Left ovary not visualized. No adnexal masses. No pelvic free fluid.  IMPRESSION: 1. Small gestational sac consistent with an early intrauterine pregnancy. Size of the gestational sac suggests a 5 week 6 day pregnancy which is consistent with the dates based on the patient's last menstrual cycle. No subchorionic hemorrhage or evidence of a pregnancy complication. Probable early intrauterine gestational sac, but no yolk sac, fetal pole, or cardiac activity yet visualized. Recommend follow-up quantitative B-HCG levels and follow-up US in 14 days to confirm and assess viability. This recommendation follows SRU consensus guidelines: Diagnostic Criteria for Nonviable Pregnancy Early in the First Trimester. Malva Limes Med 2013; 161:0960-45. 2. No adnexal masses.  No pelvic fluid.   Electronically Signed   By: Amie Portland M.D.   On: 08/07/2014 00:28   US Ob Transvaginal  08/07/2014   CLINICAL DATA:  Lower abdominal pain. Pregnant patient. Beta HCG level 7,801. Patient 5 weeks 4 days pregnant based on the last menstrual period.  EXAM: OBSTETRIC <14 WK [place]  AND TRANSVAGINAL OB US  TECHNIQUE: Both transabdominal and transvaginal ultrasound examinations were performed for complete evaluation of the gestation as well as the maternal uterus, adnexal regions, and pelvic cul-de-sac. Transvaginal technique was performed to assess early pregnancy.  COMPARISON:  None.  FINDINGS: Intrauterine gestational sac: Visualized/normal in shape.  Yolk sac:  None  Embryo:  No  Cardiac Activity: Not applicable  MSD:  3  mm   5 w   6  d  [place] EDC: 04/03/2015  Maternal uterus/adnexae: No uterine mass. Uterus measures 7.9 cm x 4.9 cm x 6.3 cm. No subchronic hemorrhage. No endometrial fluid. Cervix unremarkable.  Right ovary is prominent measuring 5 cm x 3.5 cm x 3 cm. It contains an 18 mm cyst. Left ovary not visualized. No adnexal masses. No  pelvic free fluid.  IMPRESSION: 1. Small gestational sac consistent with an early intrauterine pregnancy. Size of the gestational sac suggests a 5 week 6 day pregnancy which is consistent with the dates based on the patient's last menstrual cycle. No subchorionic hemorrhage or evidence of a pregnancy complication. Probable early intrauterine gestational sac, but no yolk sac, fetal pole, or cardiac activity yet visualized. Recommend follow-up quantitative B-HCG levels and follow-up US in 14 days to confirm and assess viability. This recommendation follows SRU consensus guidelines: Diagnostic Criteria for Nonviable Pregnancy Early in the First Trimester. Malva Limes Med 2013; 161:0960-45. 2. No adnexal masses.  No pelvic fluid.   Electronically Signed   By: Amie Portland M.D.   On: 08/07/2014 00:28     EKG Interpretation None      MDM   Final diagnoses:  Lower abdominal pain  Pregnancy  Vaginal candidiasis   Theresa [name] presents with lower abdominal pain.  Patient is sexually active without birth control method. Procedures positive here. Pelvic exam with clear evidence of candida. Will begin belly pain and pregnancy workup including  ultrasound.  12:50 AM Labs reassuring. Patient AB+, does not need RhoGAM. No vaginal bleeding on pelvic exam. Beta-hCG 7800 and ultrasound with evidence of early intrauterine pregnancy at 5 weeks and 6 days.  Patient without lateralizing abdominal tenderness and vitals have been stable. No emesis here in the department. Patient will be discharged home with vaginal nystatin.  She is to follow-up with OB/GYN within 3 days for further evaluation. She is to return sooner for worsening abdominal pain, intractable vomiting, high fevers or vaginal bleeding.  I have personally reviewed patient's vitals, nursing note and any pertinent labs or imaging.  I performed an undressed physical exam.    It has been determined that no acute conditions requiring further emergency intervention are present at this time. The patient/guardian have been advised of the diagnosis and plan. I reviewed all labs and imaging including any potential incidental findings. We have discussed signs and symptoms that warrant return to the ED and they are listed in the discharge instructions.    Vital signs are stable at discharge.   BP 117/58 mmHg  Pulse 83  Temp(Src) 98.3 F (36.8 C) (Oral)  Resp 18  SpO2 100%  LMP 07/30/2014        Dahlia Client Ajeet Casasola, PA-C 08/07/14 4098  Gwyneth Sprout, MD 08/07/14 1134

## 2014-08-06 NOTE — ED Notes (Signed)
Pt complains of vaginal discharge and lower abdominal pain for two days

## 2014-08-07 LAB — GC/CHLAMYDIA PROBE AMP (~~LOC~~) NOT AT ARMC
Chlamydia: NEGATIVE
NEISSERIA GONORRHEA: NEGATIVE

## 2014-08-07 MED ORDER — NYSTATIN 100000 UNITS VA TABS: 1.0000 | ORAL_TABLET | Freq: Every day | VAGINAL | Status: DC

## 2014-08-07 NOTE — ED Notes (Signed)
Pt noted to have been screaming in her room. Pt has now gotten changed back into her clothing and ambulated to the BR without assistance

## 2014-08-07 NOTE — Discharge Instructions (Signed)
1. Medications: nystatin, usual home medications 2. Treatment: rest, drink plenty of fluids,  3. Follow Up: Please followup with OB/GYN in 3 days for discussion of your diagnoses and further evaluation after today's visit; if you do not have a primary care doctor use the resource guide provided to find one; Please return to the ER for worsening abd pain, persistent vomiting or vaginal bleeding

## 2014-08-08 LAB — RPR: RPR: NONREACTIVE

## 2014-08-08 LAB — HIV ANTIBODY (ROUTINE TESTING W REFLEX)
HIV 1/O/2 Abs-Index Value: <1 (ref <1.00)
HIV-1/HIV-2 Ab: NONREACTIVE

## 2014-08-10 ENCOUNTER — Inpatient Hospital Stay (HOSPITAL_COMMUNITY)
Admission: AD | Admit: 2014-08-10 | Discharge: 2014-08-10 | Disposition: A | Discharge status: Home / Self Care | Payer: BLUE CROSS/BLUE SHIELD | Source: Ambulatory Visit | Attending: Obstetrics & Gynecology | Admitting: Obstetrics & Gynecology

## 2014-08-10 ENCOUNTER — Encounter (HOSPITAL_COMMUNITY): Payer: Self-pay | Admitting: *Deleted

## 2014-08-10 DIAGNOSIS — O210 Mild hyperemesis gravidarum: Secondary | ICD-10-CM | POA: Insufficient documentation

## 2014-08-10 DIAGNOSIS — Z87891 Personal history of nicotine dependence: Secondary | ICD-10-CM | POA: Insufficient documentation

## 2014-08-10 DIAGNOSIS — Z3A01 Less than 8 weeks gestation of pregnancy: Secondary | ICD-10-CM | POA: Diagnosis not present

## 2014-08-10 DIAGNOSIS — O219 Vomiting of pregnancy, unspecified: Secondary | ICD-10-CM

## 2014-08-10 LAB — URINALYSIS, ROUTINE W REFLEX MICROSCOPIC
Bilirubin Urine: NEGATIVE
Glucose, UA: NEGATIVE mg/dL
HGB URINE DIPSTICK: NEGATIVE
KETONES UR: 15 mg/dL — AB
Leukocytes, UA: NEGATIVE
Nitrite: NEGATIVE
Protein, ur: NEGATIVE mg/dL
Specific Gravity, Urine: 1.02 (ref 1.005–1.030)
Urobilinogen, UA: 1 mg/dL (ref 0.0–1.0)
pH: 6.5 (ref 5.0–8.0)

## 2014-08-10 LAB — HCG, QUANTITATIVE, PREGNANCY: hCG, Beta Chain, Quant, S: 16788 m[IU]/mL — ABNORMAL HIGH (ref <5)

## 2014-08-10 MED ORDER — PROMETHAZINE HCL 25 MG PO TABS
12.5000 mg | ORAL_TABLET | Freq: Once | ORAL | Status: AC
  Administered 2014-08-10: 12.5 mg via ORAL
  Filled 2014-08-10: qty 1

## 2014-08-10 MED ORDER — PROMETHAZINE HCL 25 MG PO TABS: 25.0000 mg | ORAL_TABLET | Freq: Four times a day (QID) | ORAL | Status: DC | PRN

## 2014-08-10 NOTE — MAU Note (Signed)
Pt reports nausea and vomiting every day, states she vomits everything she tries to eat. Denies bleeding.

## 2014-08-10 NOTE — MAU Provider Note (Signed)
History     CSN: [id number]  Arrival date and time: 08/10/14 2004   First Provider Initiated Contact with Patient 08/10/14 2043      Chief Complaint  Patient presents with  . Morning Sickness   HPI   Theresa [name] is a 24 y.o. G2P1001 at 344w2d who presents with vomiting. Symptoms started yesterday. Vomited every time she eats; states has vomited 8 times in the last 24 hrs. Lower abdominal cramping/tightening while vomiting; denies abdominal pain when not vomiting. Denies vaginal bleeding/vaginal discharge/diarrhea/constipation. Last vomited 330 pm today; has been able to eat since then. Diet since yesterday consists of spinach, baked chicken, [employer] chicken sandwich & fries, and [employer]. No pain at this time.    OB History    Gravida Para Term Preterm AB TAB SAB Ectopic Multiple Living   2 1 1       1       Past Medical History  Diagnosis Date  . Asthma   . Left fibular fracture 12/2011    referred by ED to outpt ortho. non-surgical mgt.   . Chlamydia age 24  . Obesity (BMI 30-39.9) 07/2013  . Achalasia     Past Surgical History  Procedure Laterality Date  . Cesarean section  12/2008  . Tonsillectomy  07/2007    Dr Narda Bondshris Newman  . Esophagogastroduodenoscopy (egd) with esophageal dilation N/A 08/06/2013    Procedure: ESOPHAGOGASTRODUODENOSCOPY (EGD) WITH ESOPHAGEAL DILATION;  Surgeon: Hart Carwinora M Brodie, MD;  [facility];  Service: Endoscopy;  Laterality: N/A;  . Esophageal manometry N/A 08/18/2013    Procedure: ESOPHAGEAL MANOMETRY (EM);  Surgeon: Hart Carwinora M Brodie, MD;  Location: [facility] ENDOSCOPY;  Service: Endoscopy;  Laterality: N/A;  . Esophagogastroduodenoscopy N/A 01/26/2014    Procedure: ESOPHAGOGASTRODUODENOSCOPY (EGD);  Surgeon: Hart Carwinora M Brodie, MD;  [facility];  Service: Endoscopy;  Laterality: N/A;  . Botox injection N/A 01/26/2014    Procedure: BOTOX INJECTION;  Surgeon: Hart Carwinora M Brodie, MD;  Location: [facility] ENDOSCOPY;  Service: Endoscopy;  Laterality: N/A;     Family History  Problem Relation Age of Onset  . Hypertension Other   . Colon cancer Neg Hx   . Esophageal cancer Neg Hx   . Rectal cancer Neg Hx   . Stomach cancer Neg Hx     History  Substance Use Topics  . Smoking status: Former Smoker -- 0.25 packs/day    Types: Cigarettes    Quit date: 08/10/2014  . Smokeless tobacco: Never Used  . Alcohol Use: Yes     Comment: occassionally    Allergies: No Known Allergies  Prescriptions prior to admission  Medication Sig Dispense Refill Last Dose  . acetaminophen (TYLENOL) 500 MG tablet Take 500 mg by mouth every 6 (six) hours as needed for headache.   Past Month at Unknown time  . acetaminophen-codeine 120-12 MG/5ML solution Take 10 mLs by mouth every 4 (four) hours as needed for moderate pain. (Patient not taking: Reported on 07/17/2014) 120 mL 0 Not Taking at Unknown time  . ibuprofen (ADVIL,MOTRIN) 800 MG tablet Take 1 tablet (800 mg total) by mouth every 8 (eight) hours as needed. (Patient not taking: Reported on 08/10/2014) 21 tablet 0 Not Taking at Unknown time  . nystatin 100000 UNITS vaginal tablet Place 1 tablet vaginally at bedtime. (Patient not taking: Reported on 08/10/2014) 14 tablet 0 Not Taking at Unknown time  . sucralfate (CARAFATE) 1 G tablet Take 1 tablet (1 g total) by mouth 4 (four) times daily -  with meals  and at bedtime. (Patient not taking: Reported on 07/17/2014) 28 tablet 0 Not Taking at Unknown time    Review of Systems  Constitutional: Negative for fever, chills and weight loss.  Respiratory: Negative.   Cardiovascular: Negative.   Gastrointestinal: Positive for heartburn, nausea, vomiting and abdominal pain (only when vomiting). Negative for diarrhea and constipation.  Genitourinary: Negative.    Physical Exam   Blood pressure 113/50, pulse 70, temperature 98.3 F (36.8 C), temperature source Oral, resp. rate 18, height 5' 4.5" (1.638 m), weight 190 lb (86.183 kg), last menstrual period 06/29/2014, SpO2  100 %.  Physical Exam  Constitutional: She is oriented to person, place, and time. She appears well-developed and well-nourished. No distress.  HENT:  Head: Normocephalic.  Cardiovascular: Normal rate and regular rhythm.   No murmur heard. Respiratory: Effort normal and breath sounds normal. No respiratory distress. She has no wheezes.  GI: Soft. Bowel sounds are normal. She exhibits no distension and no mass. There is no tenderness.  Neurological: She is alert and oriented to person, place, and time.  Skin: Skin is warm and dry. She is not diaphoretic.  Psychiatric: She has a normal mood and affect.   Results for orders placed or performed during the hospital encounter of 08/10/14 (from the past 24 hour(s))  Urinalysis, Routine w reflex microscopic     Status: Abnormal   Collection Time: 08/10/14  8:16 PM  Result Value Ref Range   Color, Urine YELLOW YELLOW   APPearance CLEAR CLEAR   Specific Gravity, Urine 1.020 1.005 - 1.030   pH 6.5 5.0 - 8.0   Glucose, UA NEGATIVE NEGATIVE mg/dL   Hgb urine dipstick NEGATIVE NEGATIVE   Bilirubin Urine NEGATIVE NEGATIVE   Ketones, ur 15 (A) NEGATIVE mg/dL   Protein, ur NEGATIVE NEGATIVE mg/dL   Urobilinogen, UA 1.0 0.0 - 1.0 mg/dL   Nitrite NEGATIVE NEGATIVE   Leukocytes, UA NEGATIVE NEGATIVE  hCG, quantitative, pregnancy     Status: Abnormal   Collection Time: 08/10/14  8:50 PM  Result Value Ref Range   hCG, Beta Chain, Quant, S 16788 (H) <5 mIU/mL     MAU Course  Procedures  MDM Phenergan 12.5 mg PO given MAU; reports improvement in symptoms Vonzella Nipple [facility] discussed HCG results with Dr. Debroah Loop; outpatient ultrasound to be scheduled Fri 1/29.  Assessment and Plan  Assessment  1. Nausea and vomiting in pregnancy prior to [redacted] weeks gestation    Plan: Discharge home Rx for phenergan Discussed diet for nausea/vomiting in pregnancy.  Schedule f/u outpatient ultrasound Friday 1/29. Discussed reasons to return to MAU.  Claudie Revering, Student-NP  08/10/2014, 8:44 PM   Patient was seen at [facility] on 08/06/14 and US showed IUGS without YS or FP. Patient was not instructed to follow-up for 48 hour quant hCG.  Discussed change in quant hCG with Dr. Debroah Loop, since patient is denies abdominal pain or vaginal bleeding we will schedule follow-up US for later this week.  Order placed for [place] on Friday. They will contact patient with appointment time.   I have seen and evaluated the patient with the NP/PA/Med student. I agree with the assessment and plan as written above.   Marny Lowenstein, PA-C  08/10/2014 10:20 PM

## 2014-08-10 NOTE — Discharge Instructions (Signed)
Morning Sickness °Morning sickness is when you feel sick to your stomach (nauseous) during pregnancy. You may feel sick to your stomach and throw up (vomit). You may feel sick in the morning, but you can feel this way any time of day. Some women feel very sick to their stomach and cannot stop throwing up (hyperemesis gravidarum). °HOME CARE °· Only take medicines as told by your doctor. °· Take multivitamins as told by your doctor. Taking multivitamins before getting pregnant can stop or lessen the harshness of morning sickness. °· Eat dry toast or unsalted crackers before getting out of bed. °· Eat 5 to 6 small meals a day. °· Eat dry and bland foods like rice and baked potatoes. °· Do not drink liquids with meals. Drink between meals. °· Do not eat greasy, fatty, or spicy foods. °· Have someone cook for you if the smell of food causes you to feel sick or throw up. °· If you feel sick to your stomach after taking prenatal vitamins, take them at night or with a snack. °· Eat protein when you need a snack (nuts, yogurt, cheese). °· Eat unsweetened gelatins for dessert. °· Wear a bracelet used for sea sickness (acupressure wristband). °· Go to a doctor that puts thin needles into certain body points (acupuncture) to improve how you feel. °· Do not smoke. °· Use a humidifier to keep the air in your house free of odors. °· Get lots of fresh air. °GET HELP IF: °· You need medicine to feel better. °· You feel dizzy or lightheaded. °· You are losing weight. °GET HELP RIGHT AWAY IF:  °· You feel very sick to your stomach and cannot stop throwing up. °· You pass out (faint). °MAKE SURE YOU: °· Understand these instructions. °· Will watch your condition. °· Will get help right away if you are not doing well or get worse. °Document Released: 08/10/2004 Document Revised: 07/08/2013 Document Reviewed: 12/18/2012 °ExitCare® Patient Information ©2015 [employer]. This information is not intended to replace advice given to you by  your health care provider. Make sure you discuss any questions you have with your health care provider. ° °Eating Plan for Hyperemesis Gravidarum °Severe cases of hyperemesis gravidarum can lead to dehydration and malnutrition. The hyperemesis eating plan is one way to lessen the symptoms of nausea and vomiting. It is often used with prescribed medicines to control your symptoms.  °WHAT CAN I DO TO RELIEVE MY SYMPTOMS? °Listen to your body. Everyone is different and has different preferences. Find what works best for you. Some of the following things may help: °· Eat and drink slowly. °· Eat 5-6 small meals daily instead of 3 large meals.   °· Eat crackers before you get out of bed in the morning.   °· Starchy foods are usually well tolerated (such as cereal, toast, bread, potatoes, pasta, rice, and pretzels).   °· Ginger may help with nausea. Add ¼ tsp ground ginger to hot tea or choose ginger tea.   °· Try drinking 100% fruit juice or an electrolyte drink. °· Continue to take your prenatal vitamins as directed by your health care provider. If you are having trouble taking your prenatal vitamins, talk with your health care provider about different options. °· Include at least 1 serving of protein with your meals and snacks (such as meats or poultry, beans, nuts, eggs, or yogurt). Try eating a protein-rich snack before bed (such as cheese and crackers or a half turkey or peanut butter sandwich). °WHAT THINGS SHOULD I   AVOID TO REDUCE MY SYMPTOMS? °The following things may help reduce your symptoms: °· Avoid foods with strong smells. Try eating meals in well-ventilated areas that are free of odors. °· Avoid drinking water or other beverages with meals. Try not to drink anything less than 30 minutes before and after meals. °· Avoid drinking more than 1 cup of fluid at a time. °· Avoid fried or high-fat foods, such as butter and cream sauces. °· Avoid spicy foods. °· Avoid skipping meals the best you can. Nausea can be  more intense on an empty stomach. If you cannot tolerate food at that time, do not force it. Try sucking on ice chips or other frozen items and make up the calories later. °· Avoid lying down within 2 hours after eating. °Document Released: 04/30/2007 Document Revised: 07/08/2013 Document Reviewed: 05/07/2013 °ExitCare® Patient Information ©2015 [employer]. This information is not intended to replace advice given to you by your health care provider. Make sure you discuss any questions you have with your health care provider. ° °

## 2014-08-18 ENCOUNTER — Telehealth (HOSPITAL_COMMUNITY): Payer: Self-pay

## 2014-08-19 ENCOUNTER — Encounter (HOSPITAL_COMMUNITY): Payer: Self-pay | Admitting: Advanced Practice Midwife

## 2014-08-19 ENCOUNTER — Ambulatory Visit (HOSPITAL_COMMUNITY)
Admission: RE | Admit: 2014-08-19 | Discharge: 2014-08-19 | Disposition: A | Discharge status: Home / Self Care | Payer: BLUE CROSS/BLUE SHIELD | Source: Ambulatory Visit | Attending: Medical | Admitting: Medical

## 2014-08-19 DIAGNOSIS — O210 Mild hyperemesis gravidarum: Secondary | ICD-10-CM | POA: Diagnosis present

## 2014-08-19 DIAGNOSIS — Z3A01 Less than 8 weeks gestation of pregnancy: Secondary | ICD-10-CM | POA: Insufficient documentation

## 2014-08-19 DIAGNOSIS — O208 Other hemorrhage in early pregnancy: Secondary | ICD-10-CM | POA: Insufficient documentation

## 2014-08-19 DIAGNOSIS — O219 Vomiting of pregnancy, unspecified: Secondary | ICD-10-CM

## 2014-08-19 NOTE — MAU Provider Note (Signed)
S: 24 y.o. G2P1001 @[redacted]w[redacted]d  by LMP presents to MAU for results from follow up first trimester ultrasound. She denies abdominal pain or bleeding today.    O: Physical Examination: General appearance - alert, well appearing, and in no distress and acyanotic, in no respiratory distress  [place] Ob Transvaginal  08/19/2014   CLINICAL DATA:  24 year old female reportedly 7 weeks 2 days pregnant with nausea and vomiting. Quantitative beta HCG 16,788  EXAM: OBSTETRIC <14 WK US AND TRANSVAGINAL OB US  TECHNIQUE: Both transabdominal and transvaginal ultrasound examinations were performed for complete evaluation of the gestation as well as the maternal uterus, adnexal regions, and pelvic cul-de-sac. Transvaginal technique was performed to assess early pregnancy.  COMPARISON:  Prior obstetrical ultrasound 08/07/2014  FINDINGS: Intrauterine gestational sac: Single intrauterine gestational sac identified cysts.  Yolk sac:  Present  Embryo:  Present  Cardiac Activity: Yes  Heart Rate: 152 bpm  CRL:   13.4  mm   7 w 4d                  US EDC: April 03, 2015  Maternal uterus/adnexae: Small subchorionic hemorrhage. The bilateral ovaries are within normal limits. The corpus luteum is noted on the right. Trace free fluid in the pelvis.  IMPRESSION: Viable single intrauterine pregnancy. The fetal heart rate is 152 beats per min. By crown-rump length, estimated gestational age is 7 weeks 4 days and the estimated date of confinement is April 03, 2015.   Electronically Signed   By: Malachy MoanHeath  McCullough M.D.   On: 08/19/2014 15:42   A: IUP @ 3655w2d by sure LMP, consistent with today's U/S  P: F/U with early prenatal care Pregnancy verification letter given Return to MAU for emergencies  Sharen CounterLisa Leftwich-Kirby Certified Nurse-Midwife

## 2014-09-15 ENCOUNTER — Inpatient Hospital Stay (HOSPITAL_COMMUNITY): Payer: BLUE CROSS/BLUE SHIELD

## 2014-09-15 ENCOUNTER — Inpatient Hospital Stay (HOSPITAL_COMMUNITY)
Admission: AD | Admit: 2014-09-15 | Discharge: 2014-09-15 | Disposition: A | Discharge status: Home / Self Care | Payer: BLUE CROSS/BLUE SHIELD | Source: Ambulatory Visit | Attending: Family Medicine | Admitting: Family Medicine

## 2014-09-15 ENCOUNTER — Encounter (HOSPITAL_COMMUNITY): Payer: Self-pay | Admitting: *Deleted

## 2014-09-15 DIAGNOSIS — Z87891 Personal history of nicotine dependence: Secondary | ICD-10-CM | POA: Insufficient documentation

## 2014-09-15 DIAGNOSIS — Z3A11 11 weeks gestation of pregnancy: Secondary | ICD-10-CM | POA: Diagnosis not present

## 2014-09-15 DIAGNOSIS — R109 Unspecified abdominal pain: Secondary | ICD-10-CM | POA: Insufficient documentation

## 2014-09-15 DIAGNOSIS — O26899 Other specified pregnancy related conditions, unspecified trimester: Secondary | ICD-10-CM

## 2014-09-15 DIAGNOSIS — O9989 Other specified diseases and conditions complicating pregnancy, childbirth and the puerperium: Secondary | ICD-10-CM | POA: Insufficient documentation

## 2014-09-15 LAB — URINALYSIS, ROUTINE W REFLEX MICROSCOPIC
BILIRUBIN URINE: NEGATIVE
Glucose, UA: NEGATIVE mg/dL
HGB URINE DIPSTICK: NEGATIVE
Ketones, ur: NEGATIVE mg/dL
Leukocytes, UA: NEGATIVE
Nitrite: NEGATIVE
PH: 7.5 (ref 5.0–8.0)
Protein, ur: NEGATIVE mg/dL
Specific Gravity, Urine: 1.01 (ref 1.005–1.030)
UROBILINOGEN UA: 0.2 mg/dL (ref 0.0–1.0)

## 2014-09-15 LAB — CBC
HCT: 38.8 % (ref 36.0–46.0)
Hemoglobin: 13.4 g/dL (ref 12.0–15.0)
MCH: 30.1 pg (ref 26.0–34.0)
MCHC: 34.5 g/dL (ref 30.0–36.0)
MCV: 87.2 fL (ref 78.0–100.0)
Platelets: 203 10*3/uL (ref 150–400)
RBC: 4.45 MIL/uL (ref 3.87–5.11)
RDW: 12.6 % (ref 11.5–15.5)
WBC: 8.7 10*3/uL (ref 4.0–10.5)

## 2014-09-15 LAB — WET PREP, GENITAL
Clue Cells Wet Prep HPF POC: NONE SEEN
Trich, Wet Prep: NONE SEEN
Yeast Wet Prep HPF POC: NONE SEEN

## 2014-09-15 NOTE — MAU Provider Note (Signed)
History     CSN: [id number]  Arrival date and time: 09/15/14 1324   First Provider Initiated Contact with Patient 09/15/14 [phone]      Chief Complaint  Patient presents with  . Abdominal Pain   HPI  23 y.o.G2P1001  presents to the MAU with Intermittent abdominal cramping and gas in lower area X 3 days. No bleeding. "Slimy" white vaginal D/C, mild itching.          Past Medical History  Diagnosis Date  . Asthma   . Left fibular fracture 12/2011    referred by ED to outpt ortho. non-surgical mgt.   . Chlamydia age 78  . Obesity (BMI 30-39.9) 07/2013  . Achalasia     Past Surgical History  Procedure Laterality Date  . Cesarean section  12/2008  . Tonsillectomy  07/2007    Dr Narda Bonds  . Esophagogastroduodenoscopy (egd) with esophageal dilation N/A 08/06/2013    Procedure: ESOPHAGOGASTRODUODENOSCOPY (EGD) WITH ESOPHAGEAL DILATION;  Surgeon: Hart Carwin, MD;  Location: [facility] ENDOSCOPY;  Service: Endoscopy;  Laterality: N/A;  . Esophageal manometry N/A 08/18/2013    Procedure: ESOPHAGEAL MANOMETRY (EM);  Surgeon: Hart Carwin, MD;  Location: [facility] ENDOSCOPY;  Service: Endoscopy;  Laterality: N/A;  . Esophagogastroduodenoscopy N/A 01/26/2014    Procedure: ESOPHAGOGASTRODUODENOSCOPY (EGD);  Surgeon: Hart Carwin, MD;  [facility];  Service: Endoscopy;  Laterality: N/A;  . Botox injection N/A 01/26/2014    Procedure: BOTOX INJECTION;  Surgeon: Hart Carwin, MD;  Location: [facility] ENDOSCOPY;  Service: Endoscopy;  Laterality: N/A;    Family History  Problem Relation Age of Onset  . Hypertension Other   . Colon cancer Neg Hx   . Esophageal cancer Neg Hx   . Rectal cancer Neg Hx   . Stomach cancer Neg Hx     History  Substance Use Topics  . Smoking status: Former Smoker -- 0.25 packs/day    Types: Cigarettes    Quit date: 08/10/2014  . Smokeless tobacco: Never Used  . Alcohol Use: Yes     Comment: occassionally    Allergies: No Known  Allergies  Prescriptions prior to admission  Medication Sig Dispense Refill Last Dose  . acetaminophen (TYLENOL) 500 MG tablet Take 500 mg by mouth every 6 (six) hours as needed for headache.   Past Month at Unknown time  . Prenatal Vit-Fe Fumarate-FA (PRENATAL MULTIVITAMIN) TABS tablet Take 1 tablet by mouth daily at 12 noon.   09/14/2014 at Unknown time  . promethazine (PHENERGAN) 25 MG tablet Take 1 tablet (25 mg total) by mouth every 6 (six) hours as needed for nausea or vomiting. 30 tablet 0 Past Month at Unknown time    Review of Systems  Constitutional: Negative for fever and chills.  Respiratory: Negative for cough.   Cardiovascular: Negative for chest pain.  Gastrointestinal: Negative for heartburn, nausea, vomiting and abdominal pain.  Genitourinary: Negative for dysuria, urgency and frequency.  Musculoskeletal: Negative.   Skin: Negative for rash.  Neurological: Negative.  Negative for headaches.  Endo/Heme/Allergies: Negative.   Psychiatric/Behavioral: Negative.    Physical Exam   Blood pressure 105/54, pulse 70, temperature 98.3 F (36.8 C), temperature source Oral, resp. rate 18, height 5' 4.5" (1.638 m), weight 85.276 kg (188 lb), last menstrual period 06/29/2014.  Physical Exam  Nursing note and vitals reviewed. Constitutional: She is oriented to person, place, and time. She appears well-developed and well-nourished.  HENT:  Head: Normocephalic and atraumatic.  Neck: Normal range of motion.  Cardiovascular: Normal rate.   Respiratory: Effort normal. No respiratory distress.  GI: Soft. She exhibits no distension and no mass. There is no tenderness. There is no rebound and no guarding.  Genitourinary: Uterus is enlarged. Cervix exhibits discharge. Cervix exhibits no motion tenderness and no friability. Right adnexum displays no mass, no tenderness and no fullness. Left adnexum displays no mass, no tenderness and no fullness. Vaginal discharge found.  Musculoskeletal:  Normal range of motion.  Neurological: She is alert and oriented to person, place, and time.  Skin: Skin is warm and dry.  Psychiatric: She has a normal mood and affect. Her behavior is normal. Judgment and thought content normal.    MAU Course  Procedures   CBC Ultrasound  Results for orders placed or performed during the hospital encounter of 09/15/14 (from the past 24 hour(s))  Urinalysis, Routine w reflex microscopic     Status: None   Collection Time: 09/15/14  8:34 AM  Result Value Ref Range   Color, Urine YELLOW YELLOW   APPearance CLEAR CLEAR   Specific Gravity, Urine 1.010 1.005 - 1.030   pH 7.5 5.0 - 8.0   Glucose, UA NEGATIVE NEGATIVE mg/dL   Hgb urine dipstick NEGATIVE NEGATIVE   Bilirubin Urine NEGATIVE NEGATIVE   Ketones, ur NEGATIVE NEGATIVE mg/dL   Protein, ur NEGATIVE NEGATIVE mg/dL   Urobilinogen, UA 0.2 0.0 - 1.0 mg/dL   Nitrite NEGATIVE NEGATIVE   Leukocytes, UA NEGATIVE NEGATIVE  Wet prep, genital     Status: Abnormal   Collection Time: 09/15/14  9:40 AM  Result Value Ref Range   Yeast Wet Prep HPF POC NONE SEEN NONE SEEN   Trich, Wet Prep NONE SEEN NONE SEEN   Clue Cells Wet Prep HPF POC NONE SEEN NONE SEEN   WBC, Wet Prep HPF POC FEW (A) NONE SEEN  CBC     Status: None   Collection Time: 09/15/14 10:09 AM  Result Value Ref Range   WBC 8.7 4.0 - 10.5 K/uL   RBC 4.45 3.87 - 5.11 MIL/uL   Hemoglobin 13.4 12.0 - 15.0 g/dL   HCT 40.9 81.1 - 91.4 %   MCV 87.2 78.0 - 100.0 fL   MCH 30.1 26.0 - 34.0 pg   MCHC 34.5 30.0 - 36.0 g/dL   RDW 78.2 95.6 - 21.3 %   Platelets 203 150 - 400 K/uL   US Ob Comp Less 14 Wks  09/15/2014   CLINICAL DATA:  Abdominal pain for 2-3 days bilaterally, first trimester pregnancy ; assigned EGA [redacted] weeks 1 day  EXAM: OBSTETRIC <14 WK ULTRASOUND  TECHNIQUE: Transabdominal ultrasound was performed for evaluation of the gestation as well as the maternal uterus and adnexal regions.  COMPARISON:  08/19/2014  FINDINGS: Intrauterine  gestational sac: Visualized/normal in shape.  Yolk sac:  Not visualized  Embryo:  Present  Cardiac Activity: Present  Heart Rate: 160 bpm  CRL:   45.7  mm   11 w 3 d  Maternal uterus/adnexae:  No subchorionic hemorrhage.  Neither ovary is visualized on transabdominal imaging question related to gravid uterus and obscuration by bowel.  No free pelvic fluid or adnexal masses identified.  IMPRESSION: Single live intrauterine gestation as above.  No acute abnormalities.   Electronically Signed   By: Ulyses Southward M.D.   On: 09/15/2014 11:01     Assessment and Plan  IUP @ [redacted]w[redacted]d Abdominal Pain in Pregnancy   Pt has appointment scheduled to start prenatal care in March @ [facility]  Return to MAU prn   Theresa [name] 09/15/2014, 10:53 AM

## 2014-09-15 NOTE — MAU Note (Signed)
Intermittent abdominal cramping and gas in lower area X 3 days. No bleeding. "Slimy" white vaginal D/C, mild itching.

## 2014-09-15 NOTE — Discharge Instructions (Signed)

## 2014-09-16 LAB — HIV ANTIBODY (ROUTINE TESTING W REFLEX): HIV Screen 4th Generation wRfx: NONREACTIVE

## 2014-09-16 LAB — GC/CHLAMYDIA PROBE AMP (~~LOC~~) NOT AT ARMC
Chlamydia: NEGATIVE
Neisseria Gonorrhea: NEGATIVE

## 2014-10-05 ENCOUNTER — Encounter: Payer: BLUE CROSS/BLUE SHIELD | Admitting: Obstetrics & Gynecology

## 2014-10-20 ENCOUNTER — Encounter: Payer: Self-pay | Admitting: Obstetrics and Gynecology

## 2014-10-20 ENCOUNTER — Encounter: Payer: Medicaid Other | Admitting: Obstetrics and Gynecology

## 2014-10-23 ENCOUNTER — Telehealth: Payer: Self-pay | Admitting: Internal Medicine

## 2014-10-23 NOTE — Telephone Encounter (Signed)
Patient states she had been doing well. She stopped taking the Procardia because she did not have insurance. She is now 4 1/2 months pregnant. She is beginning to have problems again.  She now has insurance.She states her OB told her it is okay to take Procardia. She wants to know what Dr. Juanda ChanceBrodie recommends and if she can have Procardia she needs a new rx.  Please, advise.

## 2014-11-04 ENCOUNTER — Encounter: Payer: Self-pay | Admitting: General Practice

## 2014-11-06 NOTE — Telephone Encounter (Signed)
Please send Procardia 10 mg, 1 SL before meals ( tid). If approved by her OB., #90, 3 refills.

## 2014-11-09 ENCOUNTER — Other Ambulatory Visit: Payer: Self-pay | Admitting: Obstetrics and Gynecology

## 2014-11-09 DIAGNOSIS — N6321 Unspecified lump in the left breast, upper outer quadrant: Secondary | ICD-10-CM

## 2014-11-09 MED ORDER — NIFEDIPINE 10 MG PO CAPS: ORAL_CAPSULE | ORAL | Status: DC

## 2014-11-09 NOTE — Telephone Encounter (Signed)
Left a message for patient to call back. 

## 2014-11-09 NOTE — Telephone Encounter (Signed)
Patient given recommendations. She states her OB has approved of her taking Procardia. Rx sent to pharmacy.

## 2014-11-13 ENCOUNTER — Other Ambulatory Visit: Payer: Medicaid Other

## 2014-11-17 ENCOUNTER — Ambulatory Visit: Payer: Medicaid Other | Admitting: Internal Medicine

## 2014-11-23 ENCOUNTER — Encounter (HOSPITAL_COMMUNITY): Payer: Self-pay | Admitting: *Deleted

## 2014-11-23 ENCOUNTER — Inpatient Hospital Stay (HOSPITAL_COMMUNITY)
Admission: AD | Admit: 2014-11-23 | Discharge: 2014-11-23 | Disposition: A | Discharge status: Home / Self Care | Payer: Medicaid Other | Source: Ambulatory Visit | Attending: Obstetrics and Gynecology | Admitting: Obstetrics and Gynecology

## 2014-11-23 DIAGNOSIS — O3421 Maternal care for scar from previous cesarean delivery: Secondary | ICD-10-CM | POA: Diagnosis present

## 2014-11-23 DIAGNOSIS — Z87891 Personal history of nicotine dependence: Secondary | ICD-10-CM | POA: Diagnosis not present

## 2014-11-23 DIAGNOSIS — B9689 Other specified bacterial agents as the cause of diseases classified elsewhere: Secondary | ICD-10-CM | POA: Insufficient documentation

## 2014-11-23 DIAGNOSIS — N760 Acute vaginitis: Secondary | ICD-10-CM | POA: Diagnosis not present

## 2014-11-23 DIAGNOSIS — Z98891 History of uterine scar from previous surgery: Secondary | ICD-10-CM

## 2014-11-23 DIAGNOSIS — Z8659 Personal history of other mental and behavioral disorders: Secondary | ICD-10-CM

## 2014-11-23 DIAGNOSIS — A491 Streptococcal infection, unspecified site: Secondary | ICD-10-CM

## 2014-11-23 DIAGNOSIS — R102 Pelvic and perineal pain: Secondary | ICD-10-CM | POA: Insufficient documentation

## 2014-11-23 DIAGNOSIS — N63 Unspecified lump in unspecified breast: Secondary | ICD-10-CM

## 2014-11-23 HISTORY — DX: Streptococcal infection, unspecified site: A49.1

## 2014-11-23 LAB — URINALYSIS, ROUTINE W REFLEX MICROSCOPIC
Bilirubin Urine: NEGATIVE
Glucose, UA: NEGATIVE mg/dL
Hgb urine dipstick: NEGATIVE
Ketones, ur: NEGATIVE mg/dL
LEUKOCYTES UA: NEGATIVE
NITRITE: NEGATIVE
PH: 7 (ref 5.0–8.0)
Protein, ur: NEGATIVE mg/dL
SPECIFIC GRAVITY, URINE: 1.015 (ref 1.005–1.030)
Urobilinogen, UA: 0.2 mg/dL (ref 0.0–1.0)

## 2014-11-23 LAB — WET PREP, GENITAL
TRICH WET PREP: NONE SEEN
Yeast Wet Prep HPF POC: NONE SEEN

## 2014-11-23 MED ORDER — LIDOCAINE 0.5 % EX GEL: 170.0000 g | Freq: Every day | CUTANEOUS | Status: DC | PRN

## 2014-11-23 MED ORDER — CLINDAMYCIN HCL 300 MG PO CAPS: 300.0000 mg | ORAL_CAPSULE | Freq: Two times a day (BID) | ORAL | Status: DC

## 2014-11-23 NOTE — MAU Note (Signed)
For the past 3 or 4 days has had the worst pains in lower stomach.  Now is having sharp pains in lower abd shooting into vagina. . Was treated for BV and yeast, but feels like it is not going away, continues to burn and itch.

## 2014-11-23 NOTE — Discharge Instructions (Signed)
Bacterial Vaginosis Bacterial vaginosis is a vaginal infection that occurs when the normal balance of bacteria in the vagina is disrupted. It results from an overgrowth of certain bacteria. This is the most common vaginal infection in women of childbearing age. Treatment is important to prevent complications, especially in pregnant women, as it can cause a premature delivery. CAUSES  Bacterial vaginosis is caused by an increase in harmful bacteria that are normally present in smaller amounts in the vagina. Several different kinds of bacteria can cause bacterial vaginosis. However, the reason that the condition develops is not fully understood. RISK FACTORS Certain activities or behaviors can put you at an increased risk of developing bacterial vaginosis, including:  Having a new sex partner or multiple sex partners.  Douching.  Using an intrauterine device (IUD) for contraception. Women do not get bacterial vaginosis from toilet seats, bedding, swimming pools, or contact with objects around them. SIGNS AND SYMPTOMS  Some women with bacterial vaginosis have no signs or symptoms. Common symptoms include:  Grey vaginal discharge.  A fishlike odor with discharge, especially after sexual intercourse.  Itching or burning of the vagina and vulva.  Burning or pain with urination. DIAGNOSIS  Your health care provider will take a medical history and examine the vagina for signs of bacterial vaginosis. A sample of vaginal fluid may be taken. Your health care provider will look at this sample under a microscope to check for bacteria and abnormal cells. A vaginal pH test may also be done.  TREATMENT  Bacterial vaginosis may be treated with antibiotic medicines. These may be given in the form of a pill or a vaginal cream. A second round of antibiotics may be prescribed if the condition comes back after treatment.  HOME CARE INSTRUCTIONS   Only take over-the-counter or prescription medicines as  directed by your health care provider.  If antibiotic medicine was prescribed, take it as directed. Make sure you finish it even if you start to feel better.  Do not have sex until treatment is completed.  Tell all sexual partners that you have a vaginal infection. They should see their health care provider and be treated if they have problems, such as a mild rash or itching.  Practice safe sex by using condoms and only having one sex partner. SEEK MEDICAL CARE IF:   Your symptoms are not improving after 3 days of treatment.  You have increased discharge or pain.  You have a fever. MAKE SURE YOU:   Understand these instructions.  Will watch your condition.  Will get help right away if you are not doing well or get worse. FOR MORE INFORMATION  Centers for Disease Control and Prevention, Division of STD Prevention: www.cdc.gov/std American Sexual Health Association (ASHA): www.ashastd.org  Document Released: 07/03/2005 Document Revised: 04/23/2013 Document Reviewed: 02/12/2013 ExitCare Patient Information 2015 [employer]. This information is not intended to replace advice given to you by your health care provider. Make sure you discuss any questions you have with your health care provider.  

## 2014-11-23 NOTE — MAU Provider Note (Signed)
History  24 yo G3P1011 @ 21.1 wks presents unannounced to MAU w/ c/o the worst pains in lower stomach for the past 3 or 4 days. Now is having sharp pains in lower abdomen shooting into her vagina. Was treated for BV and yeast, but feels like it is not going away, continues to burn and itch, spreading to the inner part of her legs. Has tried all the home remedies and nothing works.Admits to using Vagisil to help with the itching.  Requests lidocaine to soothe vulva area. Has used in the past with good results.  Patient Active Problem List   Diagnosis Date Noted  . H/O: depression 11/24/2014  . H/O: C-section - 2010 - NRFHRT 11/23/2014  . Desires VBAC (vaginal birth after cesarean) trial 11/23/2014  . Breast lump 11/23/2014  . Infection due to enterococcus - at [facility] - treated 11/23/2014  . Vomiting 08/06/2013  . Dysphagia 08/06/2013  . Tobacco abuse 08/06/2013  . Protein-calorie malnutrition, severe 08/06/2013  . Achalasia, esophageal 08/06/2013    Chief Complaint  Patient presents with  . Abdominal Pain  . Vaginal Itching   HPI Vaginal itching OB History    Gravida Para Term Preterm AB TAB SAB Ectopic Multiple Living   2 1 1       1       Past Medical History  Diagnosis Date  . Asthma   . Left fibular fracture 12/2011    referred by ED to outpt ortho. non-surgical mgt.   . Chlamydia age 89  . Obesity (BMI 30-39.9) 07/2013  . Achalasia     Past Surgical History  Procedure Laterality Date  . Cesarean section  12/2008  . Tonsillectomy  07/2007    Dr Narda Bondshris Newman  . Esophagogastroduodenoscopy (egd) with esophageal dilation N/A 08/06/2013    Procedure: ESOPHAGOGASTRODUODENOSCOPY (EGD) WITH ESOPHAGEAL DILATION;  Surgeon: Hart Carwinora M Brodie, MD;  [facility];  Service: Endoscopy;  Laterality: N/A;  . Esophageal manometry N/A 08/18/2013    Procedure: ESOPHAGEAL MANOMETRY (EM);  Surgeon: Hart Carwinora M Brodie, MD;  Location: [facility] ENDOSCOPY;  Service: Endoscopy;  Laterality: N/A;  .  Esophagogastroduodenoscopy N/A 01/26/2014    Procedure: ESOPHAGOGASTRODUODENOSCOPY (EGD);  Surgeon: Hart Carwinora M Brodie, MD;  [facility];  Service: Endoscopy;  Laterality: N/A;  . Botox injection N/A 01/26/2014    Procedure: BOTOX INJECTION;  Surgeon: Hart Carwinora M Brodie, MD;  Location: [facility] ENDOSCOPY;  Service: Endoscopy;  Laterality: N/A;    Family History  Problem Relation Age of Onset  . Hypertension Other   . Colon cancer Neg Hx   . Esophageal cancer Neg Hx   . Rectal cancer Neg Hx   . Stomach cancer Neg Hx     History  Substance Use Topics  . Smoking status: Former Smoker -- 0.25 packs/day    Types: Cigarettes    Quit date: 08/10/2014  . Smokeless tobacco: Never Used  . Alcohol Use: Yes     Comment: occassionally    Allergies: No Known Allergies  No prescriptions prior to admission    ROS  +FM, -LOF, -ctxs, -LOF Physical Exam   Blood pressure 110/57, pulse 69, temperature 98.3 F (36.8 C), temperature source Oral, resp. rate 16, height 5' 3.5" (1.613 m), weight 88.451 kg (195 lb), last menstrual period 06/29/2014.  Results for orders placed or performed during the hospital encounter of 11/23/14 (from the past 24 hour(s))  Urinalysis, Routine w reflex microscopic     Status: None   Collection Time: 11/23/14  6:40 PM  Result Value Ref Range   Color, Urine YELLOW YELLOW   APPearance CLEAR CLEAR   Specific Gravity, Urine 1.015 1.005 - 1.030   pH 7.0 5.0 - 8.0   Glucose, UA NEGATIVE NEGATIVE mg/dL   Hgb urine dipstick NEGATIVE NEGATIVE   Bilirubin Urine NEGATIVE NEGATIVE   Ketones, ur NEGATIVE NEGATIVE mg/dL   Protein, ur NEGATIVE NEGATIVE mg/dL   Urobilinogen, UA 0.2 0.0 - 1.0 mg/dL   Nitrite NEGATIVE NEGATIVE   Leukocytes, UA NEGATIVE NEGATIVE  Wet prep, genital     Status: Abnormal   Collection Time: 11/23/14  8:30 PM  Result Value Ref Range   Yeast Wet Prep HPF POC NONE SEEN NONE SEEN   Trich, Wet Prep NONE SEEN NONE SEEN   Clue Cells Wet Prep HPF POC  MODERATE (A) NONE SEEN   WBC, Wet Prep HPF POC FEW (A) NONE SEEN    Physical Exam Gen: Crying Lungs: CTAB CV: RRR w/o murmur Abdomen: soft, NT, no guarding or rebound tenderness Pelvic: Cervix visually closed, no bleeding. Thick discharge noted - sample collected for GC/CT and wet prep Ext: WNL FHR: 152 bpm Toco: No ctxs ED Course  Assessment: BV Round ligament pain  Plan: Reviewed common discomforts of pregnancy for this gestational age Reviewed proper hygiene, products safe in pregnancy and products that may cause a yeast infection or BV Clindamycin 300 mg tabs Lidocaine for external use Coconut or olive oil for vulva comfort Will f/u w/ pt in 3 days to find out if medication/home products helpful PTL precautions Continue daily fetal movement awareness OB f/u as scheduled   Sherre ScarletWILLIAMS, Ekam Bonebrake CNM, MS 11/23/14, 09:49 PM

## 2014-11-23 NOTE — MAU Note (Signed)
Called Sherre ScarletKimberly Williams CNM to inquire when will be available to evaluate patient, per CNM place on toco and she will be in MAU soon to perform pelvic exam with cultures.

## 2014-11-23 NOTE — Progress Notes (Signed)
Patient is concerned about her recurring yeast/BV infection, now itching more spreading inner part of legs. Has tried all the home remedies and nothing works.

## 2014-11-24 DIAGNOSIS — Z8659 Personal history of other mental and behavioral disorders: Secondary | ICD-10-CM

## 2014-11-24 LAB — GC/CHLAMYDIA PROBE AMP (~~LOC~~) NOT AT ARMC
CHLAMYDIA, DNA PROBE: NEGATIVE
Neisseria Gonorrhea: NEGATIVE

## 2014-11-26 ENCOUNTER — Other Ambulatory Visit: Payer: Self-pay

## 2014-12-24 ENCOUNTER — Inpatient Hospital Stay (HOSPITAL_COMMUNITY)
Admission: AD | Admit: 2014-12-24 | Discharge: 2014-12-24 | Disposition: A | Discharge status: Home / Self Care | Payer: Medicaid Other | Source: Ambulatory Visit | Attending: Obstetrics & Gynecology | Admitting: Obstetrics & Gynecology

## 2014-12-24 ENCOUNTER — Encounter (HOSPITAL_COMMUNITY): Payer: Self-pay | Admitting: *Deleted

## 2014-12-24 DIAGNOSIS — R42 Dizziness and giddiness: Secondary | ICD-10-CM | POA: Diagnosis present

## 2014-12-24 DIAGNOSIS — Z3A25 25 weeks gestation of pregnancy: Secondary | ICD-10-CM | POA: Insufficient documentation

## 2014-12-24 DIAGNOSIS — R109 Unspecified abdominal pain: Secondary | ICD-10-CM | POA: Insufficient documentation

## 2014-12-24 DIAGNOSIS — Z87891 Personal history of nicotine dependence: Secondary | ICD-10-CM | POA: Diagnosis not present

## 2014-12-24 DIAGNOSIS — O9989 Other specified diseases and conditions complicating pregnancy, childbirth and the puerperium: Secondary | ICD-10-CM | POA: Diagnosis not present

## 2014-12-24 LAB — CBC
HCT: 32 % — ABNORMAL LOW (ref 36.0–46.0)
Hemoglobin: 11.2 g/dL — ABNORMAL LOW (ref 12.0–15.0)
MCH: 29.9 pg (ref 26.0–34.0)
MCHC: 35 g/dL (ref 30.0–36.0)
MCV: 85.3 fL (ref 78.0–100.0)
Platelets: 183 10*3/uL (ref 150–400)
RBC: 3.75 MIL/uL — ABNORMAL LOW (ref 3.87–5.11)
RDW: 13.4 % (ref 11.5–15.5)
WBC: 7.9 10*3/uL (ref 4.0–10.5)

## 2014-12-24 LAB — URINALYSIS, ROUTINE W REFLEX MICROSCOPIC
BILIRUBIN URINE: NEGATIVE
GLUCOSE, UA: NEGATIVE mg/dL
HGB URINE DIPSTICK: NEGATIVE
KETONES UR: NEGATIVE mg/dL
Leukocytes, UA: NEGATIVE
Nitrite: NEGATIVE
PROTEIN: NEGATIVE mg/dL
Specific Gravity, Urine: 1.01 (ref 1.005–1.030)
Urobilinogen, UA: 0.2 mg/dL (ref 0.0–1.0)
pH: 7 (ref 5.0–8.0)

## 2014-12-24 NOTE — MAU Note (Addendum)
Pt stated she fell a few days ago on her back and hit her head after being dizzy. Did not come in for that. Today stated she is not feeling well again. Feeling dizzy and "out of it" stated she has been drinking water trying to keep herself hydrated but it has not helped. Still feels bad. C/o cramping on nd off also

## 2014-12-24 NOTE — MAU Provider Note (Addendum)
History   24 y/o G3P1011  EGA, EDD 04/03/15 here complaining of feeling dizzy and some abdominal cramping. She reports that 4 days ago while standing at a checkout in a store she felt lightheaded dizzy and passed out hitting her head on the checkout counter. This was a witnessed fall and there was no trauma to the abdomen. Patient states that she had not been eating well or drinking regularly recently. She reports that she hasn't been eating much because she has been under a lot of stress lately with recently her partner having to be incarcerated and she having housing issues.  She also states that she just wants to make sure that the baby is fine today.    Patient Active Problem List   Diagnosis Date Noted  . H/O: depression 11/24/2014  . H/O: C-section - 2010 - NRFHRT 11/23/2014  . Desires VBAC (vaginal birth after cesarean) trial 11/23/2014  . Breast lump 11/23/2014  . Infection due to enterococcus - at [facility] - treated 11/23/2014  . Vomiting 08/06/2013  . Dysphagia 08/06/2013  . Tobacco abuse 08/06/2013  . Protein-calorie malnutrition, severe 08/06/2013  . Achalasia, esophageal 08/06/2013    Chief Complaint  Patient presents with  . Dizziness   HPI  OB History    Gravida Para Term Preterm AB TAB SAB Ectopic Multiple Living   Past Medical History  Diagnosis Date  . Asthma   . Left fibular fracture 12/2011    referred by ED to outpt ortho. non-surgical mgt.   . Chlamydia age 37  . Obesity (BMI 30-39.9) 07/2013  . Achalasia     Past Surgical History  Procedure Laterality Date  . Cesarean section  12/2008  . Tonsillectomy  07/2007    Dr Narda Bonds  . Esophagogastroduodenoscopy (egd) with esophageal dilation N/A 08/06/2013    Procedure: ESOPHAGOGASTRODUODENOSCOPY (EGD) WITH ESOPHAGEAL DILATION;  Surgeon: Hart Carwin, MD;  Location: [facility] ENDOSCOPY;  Service: Endoscopy;  Laterality: N/A;  . Esophageal manometry N/A 08/18/2013    Procedure: ESOPHAGEAL  MANOMETRY (EM);  Surgeon: Hart Carwin, MD;  Location: [facility] ENDOSCOPY;  Service: Endoscopy;  Laterality: N/A;  . Esophagogastroduodenoscopy N/A 01/26/2014    Procedure: ESOPHAGOGASTRODUODENOSCOPY (EGD);  Surgeon: Hart Carwin, MD;  [facility];  Service: Endoscopy;  Laterality: N/A;  . Botox injection N/A 01/26/2014    Procedure: BOTOX INJECTION;  Surgeon: Hart Carwin, MD;  Location: [facility] ENDOSCOPY;  Service: Endoscopy;  Laterality: N/A;    Family History  Problem Relation Age of Onset  . Hypertension Other   . Colon cancer Neg Hx   . Esophageal cancer Neg Hx   . Rectal cancer Neg Hx   . Stomach cancer Neg Hx     History  Substance Use Topics  . Smoking status: Former Smoker -- 0.25 packs/day    Types: Cigarettes    Quit date: 08/10/2014  . Smokeless tobacco: Never Used  . Alcohol Use: Yes     Comment: occassionally    Allergies: No Known Allergies  No prescriptions prior to admission    ROS  Constitutional: Denies fevers or chills. Had dizziness that has resolved.  Cardiovascular system: Denies chest pain or palpitations  Pulmonary system: Denies coughing or wheezing  Musculoskeletal system: Denies muscle pain or joint aches  Obstetric system: Denies vaginal bleeding or leakage of fluid. With gross fetal movement  Neurologic system: Denies paresthesias or problems with the five  senses.      Blood pressure 104/59, pulse 72, temperature 97.8 F (36.6 C), temperature source Oral, resp. rate 18, height 5' 4.5" (1.638 m), weight 87.714 kg (193 lb 6 oz), last menstrual period 06/29/2014, SpO2 100 %. Gen: No acute distress, oriented to self, place and time.  No sensory or motor deficits. Abdomen: Soft, nontender, gravid EFM: 135, mod var, reactive. TOCO: No contractions CBC    Component Value Date/Time   WBC 7.9 12/24/2014 1348   RBC 3.75* 12/24/2014 1348   HGB 11.2* 12/24/2014 1348   HCT 32.0* 12/24/2014 1348   PLT 183 12/24/2014 1348   MCV 85.3  12/24/2014 1348   MCH 29.9 12/24/2014 1348   MCHC 35.0 12/24/2014 1348   RDW 13.4 12/24/2014 1348   LYMPHSABS 1.9 02/13/2014 1421   MONOABS 0.4 02/13/2014 1421   EOSABS 0.3 02/13/2014 1421   BASOSABS 0.0 02/13/2014 1421   CMP     Component Value Date/Time   NA 133* 08/06/2014 2218   K 3.4* 08/06/2014 2218   CL 105 08/06/2014 2218   CO2 20 08/06/2014 2218   GLUCOSE 79 08/06/2014 2218   BUN 9 08/06/2014 2218   CREATININE 0.53 08/06/2014 2218   CALCIUM 8.5 08/06/2014 2218   GFRNONAA >90 08/06/2014 2218   GFRAA >90 08/06/2014 2218    Physical Exam  ED Course Patient had some ice chips and water and reported feeling normal symptoms of dizziness   Assessment: Pregnancy at 25 weeks 5 days with results dizziness. We discussed that her prior fainting episode and dizziness were likely caused by decreased oral intake.  Plan: Discussed importance of regular oral intake, prenatal vitamin tab use and good hydration in pregnancy  Advised her to follow-up with social work at the office Mr. Pricilla Loveless off for help with housing and in other ways  Patient advised to follow-up at the office as scheduled next week  Pregnancy precautions reviewed      Konrad Felix MD 12/24/2014 4:35 PM 25

## 2015-01-08 ENCOUNTER — Emergency Department (HOSPITAL_COMMUNITY)
Admission: EM | Admit: 2015-01-08 | Discharge: 2015-01-08 | Disposition: A | Discharge status: Home / Self Care | Payer: Medicaid Other | Attending: Emergency Medicine | Admitting: Emergency Medicine

## 2015-01-08 ENCOUNTER — Encounter (HOSPITAL_COMMUNITY): Payer: Self-pay | Admitting: Emergency Medicine

## 2015-01-08 DIAGNOSIS — J45909 Unspecified asthma, uncomplicated: Secondary | ICD-10-CM | POA: Insufficient documentation

## 2015-01-08 DIAGNOSIS — Z79899 Other long term (current) drug therapy: Secondary | ICD-10-CM | POA: Insufficient documentation

## 2015-01-08 DIAGNOSIS — Z792 Long term (current) use of antibiotics: Secondary | ICD-10-CM | POA: Diagnosis not present

## 2015-01-08 DIAGNOSIS — E669 Obesity, unspecified: Secondary | ICD-10-CM | POA: Diagnosis not present

## 2015-01-08 DIAGNOSIS — K644 Residual hemorrhoidal skin tags: Secondary | ICD-10-CM | POA: Diagnosis not present

## 2015-01-08 DIAGNOSIS — Z8781 Personal history of (healed) traumatic fracture: Secondary | ICD-10-CM | POA: Diagnosis not present

## 2015-01-08 DIAGNOSIS — K5900 Constipation, unspecified: Secondary | ICD-10-CM | POA: Insufficient documentation

## 2015-01-08 DIAGNOSIS — Z87891 Personal history of nicotine dependence: Secondary | ICD-10-CM | POA: Diagnosis not present

## 2015-01-08 DIAGNOSIS — Z8619 Personal history of other infectious and parasitic diseases: Secondary | ICD-10-CM | POA: Diagnosis not present

## 2015-01-08 MED ORDER — DOCUSATE SODIUM 100 MG PO CAPS: 100.0000 mg | ORAL_CAPSULE | Freq: Two times a day (BID) | ORAL | Status: DC

## 2015-01-08 MED ORDER — LIDOCAINE HCL 2 % EX GEL
1.0000 "application " | Freq: Once | CUTANEOUS | Status: AC
  Administered 2015-01-08: 1 via TOPICAL
  Filled 2015-01-08: qty 20

## 2015-01-08 MED ORDER — GLYCERIN (LAXATIVE) 2.1 G RE SUPP
1.0000 | Freq: Once | RECTAL | Status: AC
Start: 2015-01-08 — End: 2015-01-08
  Administered 2015-01-08: 1 via RECTAL
  Filled 2015-01-08: qty 1

## 2015-01-08 MED ORDER — PRAMOXINE HCL 1 % RE FOAM: 1.0000 "application " | Freq: Three times a day (TID) | RECTAL | Status: DC | PRN

## 2015-01-08 NOTE — ED Notes (Signed)
This note also relates to the following rows which could not be included: Pulse Rate - Cannot attach notes to unvalidated device data SpO2 - Cannot attach notes to unvalidated device data   RROB called to monitor pt who is in due to constipation and hemeroid flare up; pt has no contractions, no vaginal bleeding or leaking of fluid, positive fetal movement

## 2015-01-08 NOTE — ED Provider Notes (Signed)
CSN: [id number]     Arrival date & time 01/08/15  1041 History   First MD Initiated Contact with Patient 01/08/15 1201     Chief Complaint  Patient presents with  . Rectal Problems  . Constipation     (Consider location/radiation/quality/duration/timing/severity/associated sxs/prior Treatment) HPI Comments: 24 year old female who is [redacted] weeks pregnant presenting with rectal pain 2 days. States her hemorrhoids started increasing in size and pain over the past 2 days, unrelieved by witch hazel and Preparation H. Reports having hemorrhoids in the past, however never this painful. Pain increased when trying to have a bowel movement or when sitting. Had a very small "turd like" bowel movement today, however last normal bowel movement was 2 days ago. Denies abdominal pain or fever.  Patient is a 24 y.o. female presenting with constipation. The history is provided by the patient.  Constipation   Past Medical History  Diagnosis Date  . Asthma   . Left fibular fracture 12/2011    referred by ED to outpt ortho. non-surgical mgt.   . Chlamydia age 66  . Obesity (BMI 30-39.9) 07/2013  . Achalasia    Past Surgical History  Procedure Laterality Date  . Cesarean section  12/2008  . Tonsillectomy  07/2007    Dr Narda Bonds  . Esophagogastroduodenoscopy (egd) with esophageal dilation N/A 08/06/2013    Procedure: ESOPHAGOGASTRODUODENOSCOPY (EGD) WITH ESOPHAGEAL DILATION;  Surgeon: Hart Carwin, MD;  Location: [facility] ENDOSCOPY;  Service: Endoscopy;  Laterality: N/A;  . Esophageal manometry N/A 08/18/2013    Procedure: ESOPHAGEAL MANOMETRY (EM);  Surgeon: Hart Carwin, MD;  Location: [facility] ENDOSCOPY;  Service: Endoscopy;  Laterality: N/A;  . Esophagogastroduodenoscopy N/A 01/26/2014    Procedure: ESOPHAGOGASTRODUODENOSCOPY (EGD);  Surgeon: Hart Carwin, MD;  Location[facility];  Service: Endoscopy;  Laterality: N/A;  . Botox injection N/A 01/26/2014    Procedure: BOTOX INJECTION;  Surgeon: Hart Carwin,  MD;  Location: [facility] ENDOSCOPY;  Service: Endoscopy;  Laterality: N/A;   Family History  Problem Relation Age of Onset  . Hypertension Other   . Colon cancer Neg Hx   . Esophageal cancer Neg Hx   . Rectal cancer Neg Hx   . Stomach cancer Neg Hx    History  Substance Use Topics  . Smoking status: Former Smoker -- 0.25 packs/day    Types: Cigarettes    Quit date: 08/10/2014  . Smokeless tobacco: Never Used  . Alcohol Use: Yes     Comment: occassionally   OB History    Gravida Para Term Preterm AB TAB SAB Ectopic Multiple Living   Review of Systems  Gastrointestinal: Positive for constipation and rectal pain.  All other systems reviewed and are negative.     Allergies  Review of patient's allergies indicates no known allergies.  Home Medications   Prior to Admission medications   Medication Sig Start Date End Date Taking? Authorizing Provider  clindamycin (CLEOCIN) 300 MG capsule Take 1 capsule (300 mg total) by mouth 2 (two) times daily. 11/23/14   Sherre Scarlet, CNM  docusate sodium (COLACE) 100 MG capsule Take 1 capsule (100 mg total) by mouth every 12 (twelve) hours. 01/08/15   Quintell Bonnin M Isyss Espinal, PA-C  Lidocaine 0.5 % GEL Apply 170 g topically daily as needed. Patient not taking: Reported on 12/24/2014 11/23/14   Sherre Scarlet, CNM  NIFEdipine (PROCARDIA) 10 MG capsule Take on sublingual before meals TID Patient taking  differently: Take 10 mg by mouth daily. Take on sublingual before meals TID 11/09/14   Hart Carwin, MD  pramoxine (PROCTOFOAM) 1 % foam Place 1 application rectally 3 (three) times daily as needed for hemorrhoids. 01/08/15   Kathrynn Speed, PA-C  Prenatal Vit-Fe Fumarate-FA (PRENATAL MULTIVITAMIN) TABS tablet Take 1 tablet by mouth daily at 12 noon.    Historical Provider, MD  promethazine (PHENERGAN) 25 MG tablet Take 1 tablet (25 mg total) by mouth every 6 (six) hours as needed for nausea or vomiting. Patient not taking: Reported on 11/23/2014  08/10/14   Marny Lowenstein, PA-C   BP 100/49 mmHg  Pulse 74  Temp(Src) 98.2 F (36.8 C) (Oral)  Resp 18  Ht 5\' 5"  (1.651 m)  Wt 197 lb 1 oz (89.387 kg)  BMI 32.79 kg/m2  SpO2 99%  LMP 06/29/2014 (Exact Date) Physical Exam  Constitutional: She is oriented to person, place, and time. She appears well-developed and well-nourished. No distress.  HENT:  Head: Normocephalic and atraumatic.  Mouth/Throat: Oropharynx is clear and moist.  Eyes: Conjunctivae and EOM are normal.  Neck: Normal range of motion. Neck supple.  Cardiovascular: Normal rate, regular rhythm and normal heart sounds.   Pulmonary/Chest: Effort normal and breath sounds normal. No respiratory distress.  Abdominal:  Gravid abdomen. No tenderness.  Genitourinary:  Large, 1 cm external hemorrhoid at 9 o'clock. No thrombosis or bleeding. Very tender.  Musculoskeletal: Normal range of motion. She exhibits no edema.  Neurological: She is alert and oriented to person, place, and time. No sensory deficit.  Skin: Skin is warm and dry.  Psychiatric: She has a normal mood and affect. Her behavior is normal.  Nursing note and vitals reviewed.   ED Course  Procedures (including critical care time) Labs Review Labs Reviewed - No data to display  Imaging Review No results found.   EKG Interpretation None      MDM   Final diagnoses:  External hemorrhoid  Constipation, unspecified constipation type   Nontoxic appearing, NAD. Afebrile. Vital signs stable. No abdominal tenderness. External hemorrhoid not thrombosed or bleeding. OB rapid response nurse was called by triage despite no OB symptoms. Rapid response nurse spoke with OB at [facility] who states that topical lidocaine and glycerin suppository are safe. Patient reports significant relief with topical lidocaine and was able to have a "large" bowel movement in the ED. Will discharge home with Proctofoam and Colace, advised sitz baths and follow-up with OB/GYN. She  has seen GI in the past for hemorrhoids, I advised her to follow-up with GI as well. Stable for d/c. Return precautions given. Patient states understanding of treatment care plan and is agreeable.  Kathrynn Speed, PA-C 01/08/15 1433  Gerhard Munch, MD 01/08/15 [phone]

## 2015-01-08 NOTE — Discharge Instructions (Signed)
Use proctofoam as directed along with colace. Continue Sitz baths.  Constipation Constipation is when a person has fewer than three bowel movements a week, has difficulty having a bowel movement, or has stools that are dry, hard, or larger than normal. As people grow older, constipation is more common. If you try to fix constipation with medicines that make you have a bowel movement (laxatives), the problem may get worse. Long-term laxative use may cause the muscles of the colon to become weak. A low-fiber diet, not taking in enough fluids, and taking certain medicines may make constipation worse.  CAUSES  1. Certain medicines, such as antidepressants, pain medicine, iron supplements, antacids, and water pills.  2. Certain diseases, such as diabetes, irritable bowel syndrome (IBS), thyroid disease, or depression.  3. Not drinking enough water.  4. Not eating enough fiber-rich foods.  5. Stress or travel.  6. Lack of physical activity or exercise.  7. Ignoring the urge to have a bowel movement.  8. Using laxatives too much.  SIGNS AND SYMPTOMS   Having fewer than three bowel movements a week.   Straining to have a bowel movement.   Having stools that are hard, dry, or larger than normal.   Feeling full or bloated.   Pain in the lower abdomen.   Not feeling relief after having a bowel movement.  DIAGNOSIS  Your health care provider will take a medical history and perform a physical exam. Further testing may be done for severe constipation. Some tests may include:  A barium enema X-ray to examine your rectum, colon, and, sometimes, your small intestine.   A sigmoidoscopy to examine your lower colon.   A colonoscopy to examine your entire colon. TREATMENT  Treatment will depend on the severity of your constipation and what is causing it. Some dietary treatments include drinking more fluids and eating more fiber-rich foods. Lifestyle treatments may include regular  exercise. If these diet and lifestyle recommendations do not help, your health care provider may recommend taking over-the-counter laxative medicines to help you have bowel movements. Prescription medicines may be prescribed if over-the-counter medicines do not work.  HOME CARE INSTRUCTIONS   Eat foods that have a lot of fiber, such as fruits, vegetables, whole grains, and beans.  Limit foods high in fat and processed sugars, such as french fries, hamburgers, cookies, candies, and soda.   A fiber supplement may be added to your diet if you cannot get enough fiber from foods.   Drink enough fluids to keep your urine clear or pale yellow.   Exercise regularly or as directed by your health care provider.   Go to the restroom when you have the urge to go. Do not hold it.   Only take over-the-counter or prescription medicines as directed by your health care provider. Do not take other medicines for constipation without talking to your health care provider first.  SEEK IMMEDIATE MEDICAL CARE IF:   You have bright red blood in your stool.   Your constipation lasts for more than 4 days or gets worse.   You have abdominal or rectal pain.   You have thin, pencil-like stools.   You have unexplained weight loss. MAKE SURE YOU:   Understand these instructions.  Will watch your condition.  Will get help right away if you are not doing well or get worse. Document Released: 03/31/2004 Document Revised: 07/08/2013 Document Reviewed: 04/14/2013 [facility] Patient Information 2015 [place], [place]. This information is not intended to replace advice given to you  by your health care provider. Make sure you discuss any questions you have with your health care provider.  Hemorrhoids Hemorrhoids are swollen veins around the rectum or anus. There are two types of hemorrhoids:  9. Internal hemorrhoids. These occur in the veins just inside the rectum. They may poke through to the outside and  become irritated and painful. 10. External hemorrhoids. These occur in the veins outside the anus and can be felt as a painful swelling or hard lump near the anus. CAUSES  Pregnancy.   Obesity.   Constipation or diarrhea.   Straining to have a bowel movement.   Sitting for long periods on the toilet.  Heavy lifting or other activity that caused you to strain.  Anal intercourse. SYMPTOMS   Pain.   Anal itching or irritation.   Rectal bleeding.   Fecal leakage.   Anal swelling.   One or more lumps around the anus.  DIAGNOSIS  Your caregiver may be able to diagnose hemorrhoids by visual examination. Other examinations or tests that may be performed include:   Examination of the rectal area with a gloved hand (digital rectal exam).   Examination of anal canal using a small tube (scope).   A blood test if you have lost a significant amount of blood.  A test to look inside the colon (sigmoidoscopy or colonoscopy). TREATMENT Most hemorrhoids can be treated at home. However, if symptoms do not seem to be getting better or if you have a lot of rectal bleeding, your caregiver may perform a procedure to help make the hemorrhoids get smaller or remove them completely. Possible treatments include:   Placing a rubber band at the base of the hemorrhoid to cut off the circulation (rubber band ligation).   Injecting a chemical to shrink the hemorrhoid (sclerotherapy).   Using a tool to burn the hemorrhoid (infrared light therapy).   Surgically removing the hemorrhoid (hemorrhoidectomy).   Stapling the hemorrhoid to block blood flow to the tissue (hemorrhoid stapling).  HOME CARE INSTRUCTIONS   Eat foods with fiber, such as whole grains, beans, nuts, fruits, and vegetables. Ask your doctor about taking products with added fiber in them (fibersupplements).  Increase fluid intake. Drink enough water and fluids to keep your urine clear or pale yellow.    Exercise regularly.   Go to the bathroom when you have the urge to have a bowel movement. Do not wait.   Avoid straining to have bowel movements.   Keep the anal area dry and clean. Use wet toilet paper or moist towelettes after a bowel movement.   Medicated creams and suppositories may be used or applied as directed.   Only take over-the-counter or prescription medicines as directed by your caregiver.   Take warm sitz baths for 15-20 minutes, 3-4 times a day to ease pain and discomfort.   Place ice packs on the hemorrhoids if they are tender and swollen. Using ice packs between sitz baths may be helpful.   Put ice in a plastic bag.   Place a towel between your skin and the bag.   Leave the ice on for 15-20 minutes, 3-4 times a day.   Do not use a donut-shaped pillow or sit on the toilet for long periods. This increases blood pooling and pain.  SEEK MEDICAL CARE IF:  You have increasing pain and swelling that is not controlled by treatment or medicine.  You have uncontrolled bleeding.  You have difficulty or you are unable to have a bowel  movement.  You have pain or inflammation outside the area of the hemorrhoids. MAKE SURE YOU:  Understand these instructions.  Will watch your condition.  Will get help right away if you are not doing well or get worse. Document Released: 06/30/2000 Document Revised: 06/19/2012 Document Reviewed: 05/07/2012 [facility] Patient Information 2015 [place], [place]. This information is not intended to replace advice given to you by your health care provider. Make sure you discuss any questions you have with your health care provider. Sitz Bath A sitz bath is a warm water bath taken in the sitting position that covers only the hips and buttocks. It may be used for either healing or hygiene purposes. Sitz baths are also used to relieve pain, itching, or muscle spasms. The water may contain medicine. Moist heat will help you heal and  relax.  HOME CARE INSTRUCTIONS  Take 3 to 4 sitz baths a day. 11. Fill the bathtub half full with warm water. 12. Sit in the water and open the drain a little. 13. Turn on the warm water to keep the tub half full. Keep the water running constantly. 14. Soak in the water for 15 to 20 minutes. 15. After the sitz bath, pat the affected area dry first. SEEK MEDICAL CARE IF:  You get worse instead of better. Stop the sitz baths if you get worse. MAKE SURE YOU:  Understand these instructions.  Will watch your condition.  Will get help right away if you are not doing well or get worse. Document Released: 03/25/2004 Document Revised: 03/27/2012 Document Reviewed: 09/30/2010 [facility] Patient Information 2015 [place], [place]. This information is not intended to replace advice given to you by your health care provider. Make sure you discuss any questions you have with your health care provider.

## 2015-01-08 NOTE — ED Notes (Signed)
This note also relates to the following rows which could not be included: Pulse Rate - Cannot attach notes to unvalidated device data SpO2 - Cannot attach notes to unvalidated device data   NST reactive and reassurring; 2nd reviewer Colette Ribas

## 2015-01-08 NOTE — ED Notes (Signed)
Pt. Stated, I started having hemoroids 2 days ago and Im pregnant.

## 2015-02-15 ENCOUNTER — Telehealth: Payer: Self-pay | Admitting: Internal Medicine

## 2015-02-15 NOTE — Telephone Encounter (Signed)
Patient is scheduled for Willette Cluster RNP for tomorrow at 9:30

## 2015-02-15 NOTE — Telephone Encounter (Signed)
Left message for patient to call back  

## 2015-02-16 ENCOUNTER — Encounter: Payer: Self-pay | Admitting: Nurse Practitioner

## 2015-02-16 ENCOUNTER — Ambulatory Visit (INDEPENDENT_AMBULATORY_CARE_PROVIDER_SITE_OTHER): Payer: Medicaid Other | Admitting: Nurse Practitioner

## 2015-02-16 VITALS — BP 108/60 | HR 82 | Ht 65.0 in | Wt 199.0 lb

## 2015-02-16 DIAGNOSIS — R1310 Dysphagia, unspecified: Secondary | ICD-10-CM

## 2015-02-16 DIAGNOSIS — K220 Achalasia of cardia: Secondary | ICD-10-CM

## 2015-02-16 NOTE — Patient Instructions (Signed)
Take Procardia 10 mg at bedtime.  We will call you with a referral to [facility] department.

## 2015-02-17 ENCOUNTER — Encounter: Payer: Self-pay | Admitting: Nurse Practitioner

## 2015-02-17 NOTE — Progress Notes (Signed)
     History of Present Illness:   Patient underwent EGD with Savary dilation January 2015 for complaints of progressive solid food dysphagia and weight loss. Findings included a hypertensive lower esophageal sphincter consistent with achalasia. Patient was started on sublingual Procardia 10 mg before each meal. She required another EGD with dilation July 2015. Plan was for eventual Botox injection or a Heller myotomy but apparently patient canceled or failed to show for subsequent appointments with [place].   Patient comes in today, she is pregnant and due to deliver mid-September. She is having significant dysphagia and regurgitation, especially at night. She is unable to sleep because of frequent regurgitation. She is compliant with Procardia but was squeezing it into back of throat instead of using SL  Current Medications, Allergies, Past Medical History, Past Surgical History, Family History and Social History were reviewed in [employer] record.   Physical Exam: General: Pleasant, well developed , black female in no acute distress Ears: Normal auditory acuity Lungs: Clear throughout to auscultation Heart: Regular rate and rhythm Abdomen: Soft, distended (pregnant). Normal bowel sounds  Neurological: Alert oriented x 4, grossly nonfocal Psychological:  Alert and cooperative. Normal mood and affect  Assessment and Recommendations:   24 year old female with achalasia s/p two EGDs with dilation (last one July 2015). Here with recurrent dysphagia (significant) and regurgitation. Patient is pregnant, due to deliver in 6 weeks. I spoke with patient's primary GI, Dr. Juanda Chance, about the case. For now we will try adding Procardia  SL at bedtime, in addition to her before meal doses. She is already sleeping with HOB elevated. Following delivery we can arrange for EGD with Botox and /or referral for a myotomy.

## 2015-02-18 NOTE — Progress Notes (Signed)
reviewe with Ms Wilmon Pali NP, and agree that  She is not a candidate for any endoscopic attempte for dilation or Botox injection. Will have to wait till after  delivery.

## 2015-03-12 LAB — OB RESULTS CONSOLE GBS: STREP GROUP B AG: POSITIVE

## 2015-03-23 ENCOUNTER — Inpatient Hospital Stay (HOSPITAL_COMMUNITY): Payer: Medicaid Other | Admitting: Anesthesiology

## 2015-03-23 ENCOUNTER — Inpatient Hospital Stay (HOSPITAL_COMMUNITY)
Admission: AD | Admit: 2015-03-23 | Discharge: 2015-03-27 | DRG: 765 | Disposition: A | Discharge status: Home / Self Care | Payer: Medicaid Other | Source: Ambulatory Visit | Attending: Obstetrics and Gynecology | Admitting: Obstetrics and Gynecology

## 2015-03-23 ENCOUNTER — Encounter (HOSPITAL_COMMUNITY): Payer: Self-pay

## 2015-03-23 ENCOUNTER — Encounter (HOSPITAL_COMMUNITY)
Admission: AD | Disposition: A | Discharge status: Home / Self Care | Payer: Self-pay | Source: Ambulatory Visit | Attending: Obstetrics and Gynecology

## 2015-03-23 DIAGNOSIS — Z6830 Body mass index (BMI) 30.0-30.9, adult: Secondary | ICD-10-CM | POA: Diagnosis not present

## 2015-03-23 DIAGNOSIS — Z87891 Personal history of nicotine dependence: Secondary | ICD-10-CM

## 2015-03-23 DIAGNOSIS — O99214 Obesity complicating childbirth: Secondary | ICD-10-CM | POA: Diagnosis present

## 2015-03-23 DIAGNOSIS — O9962 Diseases of the digestive system complicating childbirth: Secondary | ICD-10-CM | POA: Diagnosis present

## 2015-03-23 DIAGNOSIS — Z8249 Family history of ischemic heart disease and other diseases of the circulatory system: Secondary | ICD-10-CM

## 2015-03-23 DIAGNOSIS — O9902 Anemia complicating childbirth: Secondary | ICD-10-CM | POA: Diagnosis present

## 2015-03-23 DIAGNOSIS — J45909 Unspecified asthma, uncomplicated: Secondary | ICD-10-CM | POA: Diagnosis present

## 2015-03-23 DIAGNOSIS — B3781 Candidal esophagitis: Secondary | ICD-10-CM | POA: Diagnosis present

## 2015-03-23 DIAGNOSIS — K220 Achalasia of cardia: Secondary | ICD-10-CM | POA: Diagnosis present

## 2015-03-23 DIAGNOSIS — E669 Obesity, unspecified: Secondary | ICD-10-CM | POA: Diagnosis present

## 2015-03-23 DIAGNOSIS — O3421 Maternal care for scar from previous cesarean delivery: Principal | ICD-10-CM | POA: Diagnosis present

## 2015-03-23 DIAGNOSIS — Z3A38 38 weeks gestation of pregnancy: Secondary | ICD-10-CM | POA: Diagnosis present

## 2015-03-23 DIAGNOSIS — O9229 Other disorders of breast associated with pregnancy and the puerperium: Secondary | ICD-10-CM | POA: Diagnosis present

## 2015-03-23 DIAGNOSIS — O1092 Unspecified pre-existing hypertension complicating childbirth: Secondary | ICD-10-CM | POA: Diagnosis present

## 2015-03-23 DIAGNOSIS — O99824 Streptococcus B carrier state complicating childbirth: Secondary | ICD-10-CM | POA: Diagnosis present

## 2015-03-23 DIAGNOSIS — Z98891 History of uterine scar from previous surgery: Secondary | ICD-10-CM

## 2015-03-23 DIAGNOSIS — O9952 Diseases of the respiratory system complicating childbirth: Secondary | ICD-10-CM | POA: Diagnosis present

## 2015-03-23 DIAGNOSIS — Z22330 Carrier of Group B streptococcus: Secondary | ICD-10-CM

## 2015-03-23 LAB — URINE MICROSCOPIC-ADD ON

## 2015-03-23 LAB — URINALYSIS, ROUTINE W REFLEX MICROSCOPIC
Glucose, UA: NEGATIVE mg/dL
HGB URINE DIPSTICK: NEGATIVE
Ketones, ur: >80 mg/dL — AB
Nitrite: NEGATIVE
PH: 7 (ref 5.0–8.0)
Protein, ur: 30 mg/dL — AB
SPECIFIC GRAVITY, URINE: 1.025 (ref 1.005–1.030)
Urobilinogen, UA: 2 mg/dL — ABNORMAL HIGH (ref 0.0–1.0)

## 2015-03-23 LAB — CBC
HCT: 36.5 % (ref 36.0–46.0)
Hemoglobin: 12.4 g/dL (ref 12.0–15.0)
MCH: 29.2 pg (ref 26.0–34.0)
MCHC: 34 g/dL (ref 30.0–36.0)
MCV: 86.1 fL (ref 78.0–100.0)
PLATELETS: 218 10*3/uL (ref 150–400)
RBC: 4.24 MIL/uL (ref 3.87–5.11)
RDW: 13.1 % (ref 11.5–15.5)
WBC: 8.3 10*3/uL (ref 4.0–10.5)

## 2015-03-23 LAB — TYPE AND SCREEN
ABO/RH(D): AB POS
Antibody Screen: NEGATIVE

## 2015-03-23 LAB — ABO/RH: ABO/RH(D): AB POS

## 2015-03-23 SURGERY — Surgical Case
Anesthesia: Spinal

## 2015-03-23 MED ORDER — LACTATED RINGERS IV SOLN: INTRAVENOUS | Status: DC

## 2015-03-23 MED ORDER — FENTANYL CITRATE (PF) 100 MCG/2ML IJ SOLN
INTRAMUSCULAR | Status: DC | PRN
  Administered 2015-03-23: 15 ug via INTRATHECAL

## 2015-03-23 MED ORDER — HYDROMORPHONE HCL 1 MG/ML IJ SOLN: 0.2500 mg | INTRAMUSCULAR | Status: DC | PRN

## 2015-03-23 MED ORDER — NALOXONE HCL 0.4 MG/ML IJ SOLN: 0.4000 mg | INTRAMUSCULAR | Status: DC | PRN

## 2015-03-23 MED ORDER — CITRIC ACID-SODIUM CITRATE 334-500 MG/5ML PO SOLN: 30.0000 mL | Freq: Once | ORAL | Status: DC

## 2015-03-23 MED ORDER — MENTHOL 3 MG MT LOZG
1.0000 | LOZENGE | OROMUCOSAL | Status: DC | PRN
  Administered 2015-03-25: 3 mg via ORAL
  Filled 2015-03-23: qty 9

## 2015-03-23 MED ORDER — MORPHINE SULFATE (PF) 0.5 MG/ML IJ SOLN
INTRAMUSCULAR | Status: DC | PRN
  Administered 2015-03-23: .2 ug via INTRATHECAL

## 2015-03-23 MED ORDER — NALBUPHINE HCL 10 MG/ML IJ SOLN
5.0000 mg | Freq: Once | INTRAMUSCULAR | Status: DC | PRN
  Filled 2015-03-23: qty 0.5

## 2015-03-23 MED ORDER — CEFAZOLIN SODIUM-DEXTROSE 2-3 GM-% IV SOLR
2.0000 g | Freq: Once | INTRAVENOUS | Status: AC
  Administered 2015-03-23: 2 g via INTRAVENOUS

## 2015-03-23 MED ORDER — SCOPOLAMINE 1 MG/3DAYS TD PT72: 1.0000 | MEDICATED_PATCH | Freq: Once | TRANSDERMAL | Status: DC

## 2015-03-23 MED ORDER — WITCH HAZEL-GLYCERIN EX PADS: 1.0000 "application " | MEDICATED_PAD | CUTANEOUS | Status: DC | PRN

## 2015-03-23 MED ORDER — FAMOTIDINE IN NACL 20-0.9 MG/50ML-% IV SOLN: 20.0000 mg | Freq: Once | INTRAVENOUS | Status: DC

## 2015-03-23 MED ORDER — LACTATED RINGERS IV SOLN
INTRAVENOUS | Status: DC | PRN
  Administered 2015-03-23: 14:00:00 via INTRAVENOUS

## 2015-03-23 MED ORDER — DIBUCAINE 1 % RE OINT: 1.0000 "application " | TOPICAL_OINTMENT | RECTAL | Status: DC | PRN

## 2015-03-23 MED ORDER — CEFAZOLIN SODIUM 1-5 GM-% IV SOLN: 1.0000 g | Freq: Three times a day (TID) | INTRAVENOUS | Status: DC

## 2015-03-23 MED ORDER — DEXTROSE 5 % IV SOLN: 1.0000 ug/kg/h | INTRAVENOUS | Status: DC | PRN

## 2015-03-23 MED ORDER — SENNOSIDES-DOCUSATE SODIUM 8.6-50 MG PO TABS
2.0000 | ORAL_TABLET | ORAL | Status: DC
  Filled 2015-03-23 (×2): qty 2

## 2015-03-23 MED ORDER — MEPERIDINE HCL 25 MG/ML IJ SOLN: 6.2500 mg | INTRAMUSCULAR | Status: DC | PRN

## 2015-03-23 MED ORDER — LACTATED RINGERS IV SOLN
INTRAVENOUS | Status: DC | PRN
  Administered 2015-03-23: 15:00:00 via INTRAVENOUS

## 2015-03-23 MED ORDER — LACTATED RINGERS IV SOLN
INTRAVENOUS | Status: DC
  Administered 2015-03-24: 03:00:00 via INTRAVENOUS
  Administered 2015-03-24: 1 mL via INTRAVENOUS

## 2015-03-23 MED ORDER — SCOPOLAMINE 1 MG/3DAYS TD PT72
MEDICATED_PATCH | TRANSDERMAL | Status: AC
Start: 2015-03-23 — End: 2015-03-24
  Filled 2015-03-23: qty 1

## 2015-03-23 MED ORDER — TETANUS-DIPHTH-ACELL PERTUSSIS 5-2.5-18.5 LF-MCG/0.5 IM SUSP: 0.5000 mL | Freq: Once | INTRAMUSCULAR | Status: DC

## 2015-03-23 MED ORDER — FENTANYL CITRATE (PF) 100 MCG/2ML IJ SOLN
INTRAMUSCULAR | Status: AC
  Filled 2015-03-23: qty 4

## 2015-03-23 MED ORDER — METOCLOPRAMIDE HCL 5 MG/ML IJ SOLN
10.0000 mg | Freq: Once | INTRAMUSCULAR | Status: AC
  Administered 2015-03-23: 10 mg via INTRAVENOUS
  Filled 2015-03-23: qty 2

## 2015-03-23 MED ORDER — KETOROLAC TROMETHAMINE 30 MG/ML IJ SOLN: 30.0000 mg | Freq: Four times a day (QID) | INTRAMUSCULAR | Status: DC | PRN

## 2015-03-23 MED ORDER — ACETAMINOPHEN 325 MG PO TABS: 650.0000 mg | ORAL_TABLET | ORAL | Status: DC | PRN

## 2015-03-23 MED ORDER — SCOPOLAMINE 1 MG/3DAYS TD PT72
1.0000 | MEDICATED_PATCH | Freq: Once | TRANSDERMAL | Status: DC
  Administered 2015-03-23: 1.5 mg via TRANSDERMAL

## 2015-03-23 MED ORDER — SIMETHICONE 80 MG PO CHEW
80.0000 mg | CHEWABLE_TABLET | ORAL | Status: DC
  Administered 2015-03-24 – 2015-03-26 (×3): 80 mg via ORAL
  Filled 2015-03-23 (×3): qty 1

## 2015-03-23 MED ORDER — OXYCODONE-ACETAMINOPHEN 5-325 MG PO TABS
2.0000 | ORAL_TABLET | ORAL | Status: DC | PRN
  Filled 2015-03-23: qty 2

## 2015-03-23 MED ORDER — ONDANSETRON HCL 4 MG/2ML IJ SOLN
INTRAMUSCULAR | Status: AC
  Filled 2015-03-23: qty 2

## 2015-03-23 MED ORDER — OXYTOCIN 10 UNIT/ML IJ SOLN
INTRAMUSCULAR | Status: AC
  Filled 2015-03-23: qty 4

## 2015-03-23 MED ORDER — LACTATED RINGERS IV BOLUS (SEPSIS)
500.0000 mL | Freq: Once | INTRAVENOUS | Status: AC
  Administered 2015-03-23: 500 mL via INTRAVENOUS

## 2015-03-23 MED ORDER — ERYTHROMYCIN 5 MG/GM OP OINT
TOPICAL_OINTMENT | OPHTHALMIC | Status: AC
  Filled 2015-03-23: qty 1

## 2015-03-23 MED ORDER — DEXAMETHASONE SODIUM PHOSPHATE 4 MG/ML IJ SOLN
INTRAMUSCULAR | Status: AC
  Filled 2015-03-23: qty 1

## 2015-03-23 MED ORDER — NALBUPHINE HCL 10 MG/ML IJ SOLN
5.0000 mg | INTRAMUSCULAR | Status: DC | PRN
  Filled 2015-03-23: qty 0.5

## 2015-03-23 MED ORDER — PHENYLEPHRINE 8 MG IN D5W 100 ML (0.08MG/ML) PREMIX OPTIME
INJECTION | INTRAVENOUS | Status: AC
  Filled 2015-03-23: qty 100

## 2015-03-23 MED ORDER — METHYLERGONOVINE MALEATE 0.2 MG/ML IJ SOLN: 0.2000 mg | INTRAMUSCULAR | Status: DC | PRN

## 2015-03-23 MED ORDER — DIPHENHYDRAMINE HCL 25 MG PO CAPS: 25.0000 mg | ORAL_CAPSULE | ORAL | Status: DC | PRN

## 2015-03-23 MED ORDER — ZOLPIDEM TARTRATE 5 MG PO TABS: 5.0000 mg | ORAL_TABLET | Freq: Every evening | ORAL | Status: DC | PRN

## 2015-03-23 MED ORDER — VITAMIN K1 1 MG/0.5ML IJ SOLN
INTRAMUSCULAR | Status: AC
  Filled 2015-03-23: qty 0.5

## 2015-03-23 MED ORDER — SODIUM CHLORIDE 0.9 % IJ SOLN: 3.0000 mL | INTRAMUSCULAR | Status: DC | PRN

## 2015-03-23 MED ORDER — FERROUS SULFATE 325 (65 FE) MG PO TABS
325.0000 mg | ORAL_TABLET | Freq: Two times a day (BID) | ORAL | Status: DC
  Administered 2015-03-26: 325 mg via ORAL
  Filled 2015-03-23 (×2): qty 1

## 2015-03-23 MED ORDER — DIPHENHYDRAMINE HCL 50 MG/ML IJ SOLN
12.5000 mg | INTRAMUSCULAR | Status: DC | PRN
  Administered 2015-03-24: 12.5 mg via INTRAVENOUS
  Filled 2015-03-23: qty 1

## 2015-03-23 MED ORDER — SIMETHICONE 80 MG PO CHEW
80.0000 mg | CHEWABLE_TABLET | Freq: Three times a day (TID) | ORAL | Status: DC
  Administered 2015-03-25 – 2015-03-27 (×6): 80 mg via ORAL
  Filled 2015-03-23 (×5): qty 1

## 2015-03-23 MED ORDER — LACTATED RINGERS IV SOLN
INTRAVENOUS | Status: DC
  Administered 2015-03-23 (×2): via INTRAVENOUS

## 2015-03-23 MED ORDER — LACTATED RINGERS IV SOLN
Freq: Once | INTRAVENOUS | Status: AC
  Administered 2015-03-23: 12:00:00 via INTRAVENOUS

## 2015-03-23 MED ORDER — IBUPROFEN 600 MG PO TABS: 600.0000 mg | ORAL_TABLET | Freq: Four times a day (QID) | ORAL | Status: DC

## 2015-03-23 MED ORDER — ONDANSETRON HCL 4 MG/2ML IJ SOLN: 4.0000 mg | Freq: Three times a day (TID) | INTRAMUSCULAR | Status: DC | PRN

## 2015-03-23 MED ORDER — BUPIVACAINE HCL (PF) 0.25 % IJ SOLN
INTRAMUSCULAR | Status: AC
  Filled 2015-03-23: qty 30

## 2015-03-23 MED ORDER — BUPIVACAINE HCL (PF) 0.25 % IJ SOLN
INTRAMUSCULAR | Status: DC | PRN
  Administered 2015-03-23: 20 mL

## 2015-03-23 MED ORDER — NIFEDIPINE 10 MG PO CAPS
10.0000 mg | ORAL_CAPSULE | ORAL | Status: DC | PRN
  Administered 2015-03-23 – 2015-03-26 (×8): 10 mg via ORAL
  Filled 2015-03-23 (×12): qty 1

## 2015-03-23 MED ORDER — OXYTOCIN 10 UNIT/ML IJ SOLN
40.0000 [IU] | INTRAMUSCULAR | Status: DC | PRN
  Administered 2015-03-23: 40 [IU] via INTRAVENOUS

## 2015-03-23 MED ORDER — BUPIVACAINE IN DEXTROSE 0.75-8.25 % IT SOLN
INTRATHECAL | Status: DC | PRN
  Administered 2015-03-23: 1.5 mg via INTRATHECAL

## 2015-03-23 MED ORDER — PRENATAL MULTIVITAMIN CH: 1.0000 | ORAL_TABLET | Freq: Every day | ORAL | Status: DC

## 2015-03-23 MED ORDER — SIMETHICONE 80 MG PO CHEW: 80.0000 mg | CHEWABLE_TABLET | ORAL | Status: DC | PRN

## 2015-03-23 MED ORDER — OXYCODONE-ACETAMINOPHEN 5-325 MG PO TABS: 1.0000 | ORAL_TABLET | ORAL | Status: DC | PRN

## 2015-03-23 MED ORDER — OXYTOCIN 40 UNITS IN LACTATED RINGERS INFUSION - SIMPLE MED: 62.5000 mL/h | INTRAVENOUS | Status: AC

## 2015-03-23 MED ORDER — ONDANSETRON HCL 4 MG/2ML IJ SOLN
INTRAMUSCULAR | Status: DC | PRN
  Administered 2015-03-23: 4 mg via INTRAVENOUS

## 2015-03-23 MED ORDER — KETOROLAC TROMETHAMINE 30 MG/ML IJ SOLN
30.0000 mg | Freq: Four times a day (QID) | INTRAMUSCULAR | Status: AC
  Administered 2015-03-23 – 2015-03-24 (×4): 30 mg via INTRAVENOUS
  Filled 2015-03-23 (×4): qty 1

## 2015-03-23 MED ORDER — METHYLERGONOVINE MALEATE 0.2 MG PO TABS: 0.2000 mg | ORAL_TABLET | ORAL | Status: DC | PRN

## 2015-03-23 MED ORDER — DIPHENHYDRAMINE HCL 25 MG PO CAPS: 25.0000 mg | ORAL_CAPSULE | Freq: Four times a day (QID) | ORAL | Status: DC | PRN

## 2015-03-23 MED ORDER — LANOLIN HYDROUS EX OINT: 1.0000 "application " | TOPICAL_OINTMENT | CUTANEOUS | Status: DC | PRN

## 2015-03-23 MED ORDER — MORPHINE SULFATE 0.5 MG/ML IJ SOLN
INTRAMUSCULAR | Status: AC
  Filled 2015-03-23: qty 100

## 2015-03-23 MED ORDER — MEASLES, MUMPS & RUBELLA VAC ~~LOC~~ INJ: 0.5000 mL | INJECTION | Freq: Once | SUBCUTANEOUS | Status: DC

## 2015-03-23 MED ORDER — PROMETHAZINE HCL 25 MG/ML IJ SOLN: 6.2500 mg | INTRAMUSCULAR | Status: DC | PRN

## 2015-03-23 MED ORDER — ACETAMINOPHEN 500 MG PO TABS: 1000.0000 mg | ORAL_TABLET | Freq: Four times a day (QID) | ORAL | Status: DC

## 2015-03-23 SURGICAL SUPPLY — 37 items
APL SKNCLS STERI-STRIP NONHPOA (GAUZE/BANDAGES/DRESSINGS) ×1
BENZOIN TINCTURE PRP APPL 2/3 (GAUZE/BANDAGES/DRESSINGS) ×2 IMPLANT
BOOTIES KNEE HIGH SLOAN (MISCELLANEOUS) ×4 IMPLANT
CLAMP CORD UMBIL (MISCELLANEOUS) IMPLANT
CLOTH BEACON ORANGE TIMEOUT ST (SAFETY) ×2 IMPLANT
DRAIN JACKSON PRT FLT 10 (DRAIN) IMPLANT
DRAPE SHEET LG 3/4 BI-LAMINATE (DRAPES) IMPLANT
DRSG OPSITE POSTOP 4X10 (GAUZE/BANDAGES/DRESSINGS) ×2 IMPLANT
DURAPREP 26ML APPLICATOR (WOUND CARE) ×2 IMPLANT
ELECT REM PT RETURN 9FT ADLT (ELECTROSURGICAL) ×2
ELECTRODE REM PT RTRN 9FT ADLT (ELECTROSURGICAL) ×1 IMPLANT
EVACUATOR SILICONE 100CC (DRAIN) IMPLANT
EXTRACTOR VACUUM M CUP 4 TUBE (SUCTIONS) IMPLANT
GLOVE BIOGEL PI IND STRL 7.0 (GLOVE) ×1 IMPLANT
GLOVE BIOGEL PI INDICATOR 7.0 (GLOVE) ×1
GLOVE ECLIPSE 6.5 STRL STRAW (GLOVE) ×2 IMPLANT
GOWN STRL REUS W/TWL LRG LVL3 (GOWN DISPOSABLE) ×4 IMPLANT
KIT ABG SYR 3ML LUER SLIP (SYRINGE) IMPLANT
NDL HYPO 25X5/8 SAFETYGLIDE (NEEDLE) IMPLANT
NEEDLE HYPO 22GX1.5 SAFETY (NEEDLE) ×2 IMPLANT
NEEDLE HYPO 25X5/8 SAFETYGLIDE (NEEDLE) IMPLANT
NS IRRIG 1000ML POUR BTL (IV SOLUTION) ×4 IMPLANT
PACK C SECTION WH (CUSTOM PROCEDURE TRAY) ×2 IMPLANT
PAD OB MATERNITY 4.3X12.25 (PERSONAL CARE ITEMS) ×2 IMPLANT
PENCIL SMOKE EVAC W/HOLSTER (ELECTROSURGICAL) ×2 IMPLANT
RTRCTR C-SECT PINK 25CM LRG (MISCELLANEOUS) ×2 IMPLANT
STRIP CLOSURE SKIN 1/2X4 (GAUZE/BANDAGES/DRESSINGS) ×2 IMPLANT
SUT MNCRL AB 3-0 PS2 27 (SUTURE) ×2 IMPLANT
SUT SILK 2 0 FSL 18 (SUTURE) IMPLANT
SUT VIC AB 0 CTX 36 (SUTURE) ×8
SUT VIC AB 0 CTX36XBRD ANBCTRL (SUTURE) ×2 IMPLANT
SUT VIC AB 1 CT1 36 (SUTURE) ×4 IMPLANT
SUT VIC AB 2-0 CT1 27 (SUTURE)
SUT VIC AB 2-0 CT1 TAPERPNT 27 (SUTURE) IMPLANT
SYR 20CC LL (SYRINGE) ×2 IMPLANT
TOWEL OR 17X24 6PK STRL BLUE (TOWEL DISPOSABLE) ×2 IMPLANT
TRAY FOLEY CATH SILVER 14FR (SET/KITS/TRAYS/PACK) ×2 IMPLANT

## 2015-03-23 NOTE — Anesthesia Procedure Notes (Signed)
Spinal Patient location during procedure: OR Start time: 03/23/2015 1:56 PM End time: 03/23/2015 1:59 PM Staffing Anesthesiologist: Suella Broad D Performed by: anesthesiologist  Preanesthetic Checklist Completed: patient identified, site marked, surgical consent, pre-op evaluation, timeout performed, IV checked, risks and benefits discussed and monitors and equipment checked Spinal Block Patient position: sitting Prep: DuraPrep Patient monitoring: heart rate, continuous pulse ox, blood pressure and cardiac monitor Approach: midline Location: L4-5 Injection technique: single-shot Needle Needle type: Introducer and Pencan  Needle gauge: 24 G Needle length: 9 cm Assessment Sensory level: T6 Additional Notes Negative paresthesia. Negative blood return. Positive free-flowing CSF. Expiration date of kit checked and confirmed. Patient tolerated procedure well, without complications.

## 2015-03-23 NOTE — Op Note (Signed)
Preoperative diagnosis: Intrauterine pregnancy at 38 weeks and 4 days, previous cesarean section, severe achalasia  Post operative diagnosis: Same  Anesthesia: Spinal  Anesthesiologist: Dr. Shona Simpson  Procedure: repeat low transverse cesarean section  Surgeon: Dr. Dois Davenport Francy Mcilvaine  Assistant: Nigel Bridgeman CNM  Estimated blood loss: 700 cc  Procedure:  After being informed of the planned procedure and possible complications including bleeding, infection, injury to other organs, informed consent is obtained. The patient is taken to OR #1 and given spinal anesthesia without complication. She is placed in the dorsal decubitus position with the pelvis tilted to the left. She is then prepped and draped in a sterile fashion. A Foley catheter is inserted in her bladder.  After assessing adequate level of anesthesia, we infiltrate the suprapubic area with 20 cc of Marcaine 0.25 and perform a Pfannenstiel incision which is brought down sharply to the fascia. The fascia is entered in a low transverse fashion. Linea alba is dissected. Peritoneum is entered in a midline fashion. An Alexis retractor is easily positioned.   The myometrium is then entered in a low transverse fashion, 2 cm above the vesico-uterine junction ; first with knife and then extended bluntly. Amniotic fluid is clear. We assist the birth of a female  infant in vertex  presentation. Mouth and nose are suctioned. The baby is delivered. The cord is clamped and sectioned. The baby is given to the neonatologist present in the room.  10 cc of blood is drawn from the umbilical vein.The placenta is allowed to deliver spontaneously. It is complete and the cord has 3 vessels. Uterine revision is negative.  We proceed with closure of the myometrium in 2 layers: First with a running locked suture of 0 Vicryl, then with a Lembert suture of 0 Vicryl imbricating the first one. Hemostasis is completed with cauterization on peritoneal edges.  Both  paracolic gutters are cleaned. Both tubes and ovaries are assessed and normal. The pelvis is profusely irrigated with warm saline to confirm a satisfactory hemostasis.  Retractors and sponges are removed. Under fascia hemostasis is completed with cauterization. The fascia is then closed with 2 running sutures of 0 Vicryl meeting midline. The wound is irrigated with warm saline and hemostasis is completed with cauterization. The skin is closed with a subcuticular suture of 3-0 Monocryl and Steri-Strips.  Instrument and sponge count is complete x2. Estimated blood loss is 700 cc.  The procedure is well tolerated by the patient who is taken to recovery room in a well and stable condition.  female baby named Theresa [name] was born at 14:42 and received an Apgar of 9  at 1 minute and 9 at 5 minutes.    Specimen: Placenta sent to [facility]   [name] A MD 9/6/20161:42 PM

## 2015-03-23 NOTE — Anesthesia Postprocedure Evaluation (Signed)
  Anesthesia Post-op Note  Patient: Theresa [name]  Procedure(s) Performed: Procedure(s): CESAREAN SECTION (N/A)  Patient Location: PACU  Anesthesia Type:Spinal  Level of Consciousness: awake, alert  and oriented  Airway and Oxygen Therapy: Patient Spontanous Breathing  Post-op Pain: none  Post-op Assessment: Post-op Vital signs reviewed and Patient's Cardiovascular Status Stable              Post-op Vital Signs: Reviewed and stable  Last Vitals:  Filed Vitals:   03/23/15 1545  BP: 112/53  Pulse: 61  Temp:   Resp: 23    Complications: No apparent anesthesia complications

## 2015-03-23 NOTE — Addendum Note (Signed)
Addendum  created 03/23/15 1905 by Shelton Silvas, MD   Modules edited: Orders, PRL Based Order Sets

## 2015-03-23 NOTE — Anesthesia Preprocedure Evaluation (Addendum)
Anesthesia Evaluation  Patient identified by MRN, date of birth, ID band Patient awake    Reviewed: Allergy & Precautions, H&P , NPO status   Airway Mallampati: II  TM Distance: >3 FB Neck ROM: Full    Dental  (+) Teeth Intact   Pulmonary former smoker,  breath sounds clear to auscultation        Cardiovascular negative cardio ROS  Rhythm:Regular Rate:Normal     Neuro/Psych negative neurological ROS     GI/Hepatic negative GI ROS, Neg liver ROS,   Endo/Other  negative endocrine ROS  Renal/GU negative Renal ROS  negative genitourinary   Musculoskeletal   Abdominal   Peds  Hematology negative hematology ROS (+)   Anesthesia Other Findings   Reproductive/Obstetrics (+) Pregnancy                            EKG: normal EKG, normal sinus rhythm.   Anesthesia Physical Anesthesia Plan  ASA: II  Anesthesia Plan: Spinal   Post-op Pain Management:    Induction:   Airway Management Planned:   Additional Equipment:   Intra-op Plan:   Post-operative Plan:   Informed Consent: I have reviewed the patients History and Physical, chart, labs and discussed the procedure including the risks, benefits and alternatives for the proposed anesthesia with the patient or authorized representative who has indicated his/her understanding and acceptance.   Dental Advisory Given  Plan Discussed with: CRNA  Anesthesia Plan Comments:         Anesthesia Quick Evaluation

## 2015-03-23 NOTE — Transfer of Care (Signed)
Immediate Anesthesia Transfer of Care Note  Patient: Theresa [name]  Procedure(s) Performed: Procedure(s): CESAREAN SECTION (N/A)  Patient Location: PACU  Anesthesia Type:Spinal  Level of Consciousness: awake, alert  and oriented  Airway & Oxygen Therapy: Patient Spontanous Breathing  Post-op Assessment: Report given to RN and Post -op Vital signs reviewed and stable  Post vital signs: Reviewed and stable  Last Vitals:  Filed Vitals:   03/23/15 0740  BP: 111/58  Pulse: 70  Temp: 36.6 C  Resp: 16    Complications: No apparent anesthesia complications

## 2015-03-23 NOTE — H&P (Signed)
Theresa [name] is a 24 y.o. female, G3P1011 at 77 1/7 weeks, presenting for inability to swallow food or fluid for several days--known hx of achalasia.  Prior to pregnancy, received botulium toxin injections in esophagus--plans surgery after delivery.  Has been followed by Dr. Juanda Chance at [facility]--last seen 8/4, with note from Dr. Juanda Chance that patient is not a candidate for endoscopic attempt for dilatioin or Botox injection, "will have to wait till after delivery".  Patient called that office last week, and was advised of the same.  She can't take in any fluid or food without vomiting it back--has been using Procardia SL several times per day as instructed by GI MD, as well as Protonix, without benefit.  Hx previous C/S, with initial plan for VBAC, but now wishes to proceed with C/S due to achalasia issue.  I consulted today with Dr. Pincus Sanes to patient's inability to have any po intake, he recommends proceeding with C/S now.  Patient Active Problem List   Diagnosis Date Noted  . 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 H/O: depression 11/24/2014  . H/O: C-section - 2010 - NRFHRT 11/23/2014  . Breast lump 11/23/2014  . Infection due to enterococcus - at NOB - treated 11/23/2014  . Vomiting 08/06/2013  . Tobacco abuse 08/06/2013  . Achalasia, esophageal 08/06/2013    History of present pregnancy: Patient entered care at 16 weeks.   EDC of 04/05/15 was established by LMP and in agreement with [place] at 7 4/7 weeks.   Anatomy scan:  19 2/7 weeks, with limited anatomy and an anterior placenta.   Additional [place] evaluations:   22 3/7 weeks, f/u anatomy:  EFW 41 %ile, normal fluid, completion of anatomy.   36 3/7 weeks:  EFW 5+13, 24%ile, normal fluid, 60%ile, vtx. Significant prenatal events:  Entered care at 16 weeks.  Had been treated for achlasia prior to pregnancy, with endoscopy and injection of botulinum toxin.  Followed by [facility], Dr. Juanda Chance.  Last seen 8/4 in that office, with plan for surgical  correction after delivery--unable to do endoscopy and botulinum injection during pregnancy.  Had been treated with Procardia SL and Protonix without recent benefit.  Hx of vulvar irritation during pregnancy, treated several times.  Initially desired VBAC, then was considering repeat C/S due to FOB being of large habitus, and patient was concerned about fetal size.  Last US showed EFW at 24%ile.  Has had worsening issues with achalasia over last 2 weeks, with severe issues in last 4-5 days, with inability to swallow any food or fluids. Last evaluation:  03/17/15-BP 102/62, weight 198 (initial weight at NOB 183, peak weight 203 on 02/23/15). Cervix closed/long, vtx.  Started on Protonix, no benefit.  OB History    Gravida Para Term Preterm AB TAB SAB Ectopic Multiple Living   2010--TAB, 5 weeks 2010--Primary LTCS, 40 2/7 weeks, 13 hour labor, female, spinal, NRFHR, delivered in [place].  2 layer closure.  Past Medical History  Diagnosis Date  . Asthma   . Left fibular fracture 12/2011    referred by ED to outpt ortho. non-surgical mgt.   . Chlamydia age 62  . Obesity (BMI 30-39.9) 07/2013  . Achalasia    Past Surgical History  Procedure Laterality Date  . Cesarean section  12/2008  . Tonsillectomy  07/2007    Dr Narda Bonds  . Esophagogastroduodenoscopy (egd) with esophageal dilation N/A 08/06/2013    Procedure: ESOPHAGOGASTRODUODENOSCOPY (EGD) WITH  ESOPHAGEAL DILATION;  Surgeon: Hart Carwin, MD;  Location: [facility] ENDOSCOPY;  Service: Endoscopy;  Laterality: N/A;  . Esophageal manometry N/A 08/18/2013    Procedure: ESOPHAGEAL MANOMETRY (EM);  Surgeon: Hart Carwin, MD;  Location: [facility] ENDOSCOPY;  Service: Endoscopy;  Laterality: N/A;  . Esophagogastroduodenoscopy N/A 01/26/2014    Procedure: ESOPHAGOGASTRODUODENOSCOPY (EGD);  Surgeon: Hart Carwin, MD;  Location: [facility] ENDOSCOPY;  Service: Endoscopy;  Laterality: N/A;  . Botox injection N/A 01/26/2014    Procedure: BOTOX INJECTION;   Surgeon: Hart Carwin, MD;  Location: [facility] ENDOSCOPY;  Service: Endoscopy;  Laterality: N/A;   Family History: family history includes Hypertension in her other. There is no history of Colon cancer, Esophageal cancer, Rectal cancer, or Stomach cancer.   Social History:  reports that she quit smoking about 7 months ago. Her smoking use included Cigarettes. She smoked 0.25 packs per day. She has never used smokeless tobacco. She reports that she does not drink alcohol or use illicit drugs.  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  Patient has 2 years of college, employed as Equities trader.     Prenatal Transfer Tool  Maternal Diabetes: No Genetic Screening: Normal Quad screen Maternal Ultrasounds/Referrals: Normal Fetal Ultrasounds or other Referrals:  None Maternal Substance Abuse:  Yes:  Type: Smoker Significant Maternal Medications:  Meds include: Other: Procardia, Protonix Significant Maternal Lab Results: Lab values include: Group B Strep positive  TDAP None Flu None  ROS:  Inability to swallow food or fluids, vomits with any attempt, +FM  No Known Allergies     Blood pressure 111/58, pulse 70, temperature 97.8 F (36.6 C), temperature source Oral, resp. rate 16, height 5\' 4"  (1.626 m), weight 89.359 kg (197 lb), last menstrual period 06/29/2014.  Chest clear Heart RRR without murmur Abd gravid, NT, FH 39 cm Pelvic: Deferred Ext: WNL  FHR: Category 1 UCs:  Occasional, mild  Prenatal labs: ABO, Rh: --/--/AB POS (01/21 2218) Antibody:  Neg Rubella:   Immune RPR: Non Reactive (01/21 2110)  HBsAg:   NR HIV:   NR GBS:  Positive 03/08/25 Sickle cell/Hgb electrophoresis:  AA Pap:  NA GC:  Negative 08/06/14, 12/03/14 Chlamydia:  Negative 08/06/14, 12/03/14 Genetic screenings:  Normal Quad screen Glucola:  WNL Other:   Hgb 13.5 at NOB, 12.5 at 28 weeks    Assessment/Plan: IUP at 38 1/7 weeks Severe achalasia--no intake x several  days GBS positive Previous C/S, desires repeat  Plan: Admit to [facility] per consult with Dr. Estanislado Pandy. Per MFM consult (Dr. Claudean Severance), will proceed with C/S today due to severe achalasia. Routine [facility] pre-op orders Ancef 2 gm on call to OR. R&B of C/S reviewed with patient, including bleeding, infection, and damage to other organs.  Patient seems to understand these risks and wishes to proceed with C/S. May need GI consult with [facility] during this hospitalization.  Klyde Banka, [place], [place] 03/23/2015, 10:00 AM

## 2015-03-23 NOTE — Interval H&P Note (Signed)
History and Physical Interval Note:  03/23/2015 1:41 PM  Theresa [name]  has presented today for surgery, with the diagnosis of * No pre-op diagnosis entered *  The various methods of treatment have been discussed with the patient and family. After consideration of risks, benefits and other options for treatment, the patient has consented to  Procedure(s): CESAREAN SECTION (N/A) as a surgical intervention .  The patient's history has been reviewed, patient examined, no change in status, stable for surgery.  I have reviewed the patient's chart and labs.  Questions were answered to the patient's satisfaction.     Melisa Donofrio A

## 2015-03-23 NOTE — MAU Note (Signed)
Pain in esophagus had botox for this in the past supposed to have surgery for this but have now because of pregnancy, cannot eat or drink.

## 2015-03-24 ENCOUNTER — Telehealth: Payer: Self-pay

## 2015-03-24 ENCOUNTER — Encounter (HOSPITAL_COMMUNITY): Payer: Self-pay | Admitting: Obstetrics and Gynecology

## 2015-03-24 DIAGNOSIS — K220 Achalasia of cardia: Secondary | ICD-10-CM

## 2015-03-24 LAB — HIV ANTIBODY (ROUTINE TESTING W REFLEX): HIV SCREEN 4TH GENERATION: NONREACTIVE

## 2015-03-24 LAB — BASIC METABOLIC PANEL
ANION GAP: 4 — AB (ref 5–15)
BUN: 5 mg/dL — ABNORMAL LOW (ref 6–20)
CALCIUM: 8 mg/dL — AB (ref 8.9–10.3)
CO2: 22 mmol/L (ref 22–32)
Chloride: 108 mmol/L (ref 101–111)
Creatinine, Ser: 0.43 mg/dL — ABNORMAL LOW (ref 0.44–1.00)
GLUCOSE: 85 mg/dL (ref 65–99)
Potassium: 3.4 mmol/L — ABNORMAL LOW (ref 3.5–5.1)
SODIUM: 134 mmol/L — AB (ref 135–145)

## 2015-03-24 LAB — CBC
HCT: 30.1 % — ABNORMAL LOW (ref 36.0–46.0)
Hemoglobin: 10.1 g/dL — ABNORMAL LOW (ref 12.0–15.0)
MCH: 28.9 pg (ref 26.0–34.0)
MCHC: 33.6 g/dL (ref 30.0–36.0)
MCV: 86 fL (ref 78.0–100.0)
PLATELETS: 163 10*3/uL (ref 150–400)
RBC: 3.5 MIL/uL — ABNORMAL LOW (ref 3.87–5.11)
RDW: 13.2 % (ref 11.5–15.5)
WBC: 6.6 10*3/uL (ref 4.0–10.5)

## 2015-03-24 LAB — RPR: RPR Ser Ql: NONREACTIVE

## 2015-03-24 MED ORDER — LACTATED RINGERS IV BOLUS (SEPSIS)
1000.0000 mL | Freq: Once | INTRAVENOUS | Status: AC
  Administered 2015-03-24: 1000 mL via INTRAVENOUS

## 2015-03-24 MED ORDER — LACTATED RINGERS IV SOLN
INTRAVENOUS | Status: DC
  Administered 2015-03-25 (×2): 999 mL via INTRAVENOUS

## 2015-03-24 MED ORDER — ACETAMINOPHEN 160 MG/5ML PO SOLN
975.0000 mg | Freq: Four times a day (QID) | ORAL | Status: AC
  Administered 2015-03-24 – 2015-03-25 (×4): 975 mg via ORAL
  Filled 2015-03-24: qty 40
  Filled 2015-03-24 (×2): qty 40.6

## 2015-03-24 MED ORDER — IBUPROFEN 100 MG/5ML PO SUSP
600.0000 mg | Freq: Four times a day (QID) | ORAL | Status: DC
  Administered 2015-03-25 – 2015-03-26 (×8): 600 mg via ORAL
  Filled 2015-03-24 (×15): qty 30

## 2015-03-24 MED ORDER — LACTATED RINGERS IV BOLUS (SEPSIS): 500.0000 mL | Freq: Once | INTRAVENOUS | Status: DC

## 2015-03-24 NOTE — Progress Notes (Signed)
Patient's urine output for 6 hours 160cc. Pt able to tolerate small amounts of liquids, LR with 40 units of pitocin still infusing. VSS, lochia and fundus WNL. Sherre Scarlet CNM called and order for LR bolus received. Will continue to closely monitor patient.

## 2015-03-24 NOTE — Addendum Note (Signed)
Addendum  created 03/24/15 0820 by Shanon Payor, CRNA   Modules edited: Notes Section   Notes Section:  File: [id number]

## 2015-03-24 NOTE — Telephone Encounter (Signed)
-----   Message from Sammuel Cooper, PA-C sent at 03/24/2015  4:28 PM EDT ----- Alexia Freestone- please refer this pt to [facility]- Dr Abbey Chatters for[facility]eller Myotomy  For Achalasia.. She is still in hospital- just had baby so maybe call her with appt time next week thanks

## 2015-03-24 NOTE — Progress Notes (Signed)
Foley flushed with 10cc normal saline. Bladder scan shows 60cc. Foley appears to be in appropriately. Updated Sherre Scarlet CNM on the floor.

## 2015-03-24 NOTE — Progress Notes (Signed)
Patient's urine output for the last 4 hours only 75cc dark amber urine. LR @ 161ml/hr running. VSS, lochia and fundus WNL. Sherre Scarlet CNM called with orders to flush foley.

## 2015-03-24 NOTE — Progress Notes (Signed)
Spoke to Theresa [name] regarding the patient's diet order.  Pt was requesting to have cream based soup. Venus requested that I call the GI doctor to verify this request. Spoke to Dr. Yancey Flemings regarding clear liquid diet for this patient. He reiterated the clear liquid diet.

## 2015-03-24 NOTE — Anesthesia Postprocedure Evaluation (Signed)
  Anesthesia Post-op Note  Patient: Theresa [name]  Procedure(s) Performed: Procedure(s): CESAREAN SECTION (N/A)  Patient Location: Mother/Baby  Anesthesia Type:Spinal  Level of Consciousness: awake, alert  and oriented  Airway and Oxygen Therapy: Patient Spontanous Breathing  Post-op Pain: none  Post-op Assessment: Post-op Vital signs reviewed, Patient's Cardiovascular Status Stable, Respiratory Function Stable, Patent Airway, Adequate PO intake, Pain level controlled, No headache and No backache              Post-op Vital Signs: Reviewed and stable  Last Vitals:  Filed Vitals:   03/24/15 0515  BP: 87/47  Pulse: 63  Temp: 36.8 C  Resp: 18    Complications: No apparent anesthesia complications

## 2015-03-24 NOTE — Progress Notes (Addendum)
Call from RN re: low normal uop (right around 30 ml).  P:  Will proceed w/ 1 liter LR bolus now.   Sherre Scarlet, CNM 03/24/15, 1:50 AM   ADDENDUM:  Call from RN at ~ 5:25 AM that uop is 75 ml. Bladder scan showed 60 ml.  Today's Vitals   03/24/15 0104 03/24/15 0110 03/24/15 0227 03/24/15 0515  BP: 89/55   87/47  Pulse: 92   63  Temp:    98.2 F (36.8 C)  TempSrc:    Oral  Resp:    18  Height:      Weight:      SpO2:      PainSc:  5  Asleep 0-No pain    O: CBC this am = WBC 6.6, Hbg 10.1, Hct 30.1, Plts 163 Lungs: CTAB CV: RRR w/o murmur Abdomen: soft, appropriately tender Lochia: scant  A: 24 yo G3 now P2012, s/p repeat c-section due to severe achalasia; no intake for several days, POD #1  Low uop  P: Consulted Dr. Dion Body -- advised BMP -- will observe closely.   Sherre Scarlet, CNM 03/24/15, 6:51 AM

## 2015-03-24 NOTE — Progress Notes (Signed)
Patient asked for Procardia prior to eating. She stated that she will use "luke warm water" to assist in swallowing if she "feels like food is getting stuck". Pt stated she would call for assistance if needed.  Family is at bedside.

## 2015-03-24 NOTE — Progress Notes (Signed)
Subjective:  Patient reports feeling better, tolerating clears.  Ambulating without difficulty. Pain well controlled.     Objective: I have reviewed patient's  vital signs.  Filed Vitals:   03/24/15 0055 03/24/15 0059 03/24/15 0104 03/24/15 0515  BP: 100/57 101/60 89/55 87/47   Pulse: 60 74 92 63  Temp: 98.3 F (36.8 C)   98.2 F (36.8 C)  TempSrc: Oral   Oral  Resp: 18   18  Height:      Weight:      SpO2: 99%       General: alert, cooperative and no distress Resp: clear to auscultation bilaterally Cardio: regular rate and rhythm, S1, S2 normal, no murmur, click, rub or gallop GI: soft, non-tender; bowel sounds normal; no masses,  no organomegaly Extremities: extremities normal, atraumatic, no cyanosis or edema   Assessment/Plan:  Postop day 1 status post repeat C-section, history of severe achalasia  Consulted with surgery -they will perform will perform surgery tomorrow: re Botox  Patient to continue clears  Continue with IV fluids    LOS: 1 day    [facility] 03/24/2015, 3:05 PM

## 2015-03-24 NOTE — Consult Note (Signed)
Consultation  Referring Provider:  GYN- Dr. Cletis Media Primary Care Physician:  PROVIDER NOT IN SYSTEM Primary Gastroenterologist:  Former Dr Olevia Perches  Reason for Consultation:   Cant swallow  HPI: Theresa [name] is a 24 y.o. female  Known to Dr Olevia Perches ,and last seen in our office in August 2016 when she presented with recurrent dysphagia. Patient was in her third trimester of pregnancy at that time and decision was made to hold on any further GI intervention until she delivered. Patient has a diagnosis of achalasia due to findings on prior endoscopies and barium swallow. Patient had a barium swallow in January 2015 which showed a markedly dilated esophagus with a beaked appearance at the GE junction. 13 mm tablet lodged at the GE junction. She has had 2 prior endoscopies per Dr. Olevia Perches the first in January 2015 at which time she was noted to have a hypertensive LES consistent with achalasia. At that time she was several redilated and then started on sublingual Procardia. She had repeat EGD in July 2015 due to recurrence of severe dysphagia. Again found to have a hypertensive LES and was several a dilated followed by Botox injections. She did not follow up until last month. Patient is now hospitalized and delivered her child yesterday by C-section. We are asked to see as she is complaining of dysphagia. She has been using sublingual Procardia 10 mg before meals and at bedtime.. Pt feels the procardia helps with spasm discomfort , but doesn't help much with dysphagia. She relates that the Botox injections done in 01/2014 helped a lot for several months afterward, then had gradual worsening of sxs. Over the past 3 weeks sxs have been terrible with solids and liquids sitting for long periods of time in esophagus, with frequent regurgitation, and spitting up through her nose at night. Weight down 5 pounds past month. She is ready to proceed with myotomy, had been told she would be referred to  [facility].   Past Medical History  Diagnosis Date  . Asthma   . Left fibular fracture 12/2011    referred by ED to outpt ortho. non-surgical mgt.   . Chlamydia age 70  . Obesity (BMI 30-39.9) 07/2013  . Achalasia     Past Surgical History  Procedure Laterality Date  . Cesarean section  12/2008  . Tonsillectomy  07/2007    Dr Radene Journey  . Esophagogastroduodenoscopy (egd) with esophageal dilation N/A 08/06/2013    Procedure: ESOPHAGOGASTRODUODENOSCOPY (EGD) WITH ESOPHAGEAL DILATION;  Surgeon: Lafayette Dragon, MD;  Location: [facility] ENDOSCOPY;  Service: Endoscopy;  Laterality: N/A;  . Esophageal manometry N/A 08/18/2013    Procedure: ESOPHAGEAL MANOMETRY (EM);  Surgeon: Lafayette Dragon, MD;  Location: [facility] ENDOSCOPY;  Service: Endoscopy;  Laterality: N/A;  . Esophagogastroduodenoscopy N/A 01/26/2014    Procedure: ESOPHAGOGASTRODUODENOSCOPY (EGD);  Surgeon: Lafayette Dragon, MD;  Location: [facility] ENDOSCOPY;  Service: Endoscopy;  Laterality: N/A;  . Botox injection N/A 01/26/2014    Procedure: BOTOX INJECTION;  Surgeon: Lafayette Dragon, MD;  Location: [facility] ENDOSCOPY;  Service: Endoscopy;  Laterality: N/A;    Prior to Admission medications   Medication Sig Start Date End Date Taking? Authorizing Provider  NIFEdipine (PROCARDIA) 10 MG capsule Take on sublingual before meals TID Patient taking differently: Take 10 mg by mouth every 4 (four) hours as needed (Achalasia Symptoms).  11/09/14  Yes Lafayette Dragon, MD  Prenatal Vit-Fe Fumarate-FA (PRENATAL MULTIVITAMIN) TABS tablet Take 1 tablet by mouth daily at 12 noon.   Yes  Historical Provider, MD    Current Facility-Administered Medications  Medication Dose Route Frequency Provider Last Rate Last Dose  . acetaminophen (TYLENOL) tablet 1,000 mg  1,000 mg Oral 4 times per day Effie Berkshire, MD      . acetaminophen (TYLENOL) tablet 650 mg  650 mg Oral Q4H PRN Delsa Bern, MD      . witch hazel-glycerin (TUCKS) pad 1 application  1 application Topical PRN Delsa Bern,  MD       And  . dibucaine (NUPERCAINAL) 1 % rectal ointment 1 application  1 application Rectal PRN Delsa Bern, MD      . diphenhydrAMINE (BENADRYL) injection 12.5 mg  12.5 mg Intravenous Q4H PRN Effie Berkshire, MD   12.5 mg at 03/24/15 0045   Or  . diphenhydrAMINE (BENADRYL) capsule 25 mg  25 mg Oral Q4H PRN Effie Berkshire, MD      . diphenhydrAMINE (BENADRYL) capsule 25 mg  25 mg Oral Q6H PRN Delsa Bern, MD      . ferrous sulfate tablet 325 mg  325 mg Oral BID WC Delsa Bern, MD      . ibuprofen (ADVIL,MOTRIN) tablet 600 mg  600 mg Oral 4 times per day Delsa Bern, MD      . ketorolac (TORADOL) 30 MG/ML injection 30 mg  30 mg Intravenous 4 times per day Effie Berkshire, MD   30 mg at 03/24/15 0707  . lactated ringers bolus 500 mL  500 mL Intravenous Once Delsa Bern, MD      . lactated ringers infusion   Intravenous Continuous Delsa Bern, MD      . lanolin ointment 1 application  1 application Topical PRN Delsa Bern, MD      . measles, mumps and rubella vaccine (MMR) injection 0.5 mL  0.5 mL Subcutaneous Once Delsa Bern, MD      . menthol-cetylpyridinium (CEPACOL) lozenge 3 mg  1 lozenge Oral Q2H PRN Delsa Bern, MD      . methylergonovine (METHERGINE) tablet 0.2 mg  0.2 mg Oral Q4H PRN Delsa Bern, MD       Or  . methylergonovine (METHERGINE) injection 0.2 mg  0.2 mg Intramuscular Q4H PRN Delsa Bern, MD      . nalbuphine (NUBAIN) injection 5 mg  5 mg Intravenous Q4H PRN Effie Berkshire, MD       Or  . nalbuphine (NUBAIN) injection 5 mg  5 mg Subcutaneous Q4H PRN Effie Berkshire, MD      . nalbuphine (NUBAIN) injection 5 mg  5 mg Intravenous Once PRN Effie Berkshire, MD       Or  . nalbuphine (NUBAIN) injection 5 mg  5 mg Subcutaneous Once PRN Effie Berkshire, MD      . naloxone [facility]) 2 mg in dextrose 5 % 250 mL infusion  1-4 mcg/kg/hr Intravenous Continuous PRN Effie Berkshire, MD      . naloxone [facility]) injection 0.4 mg  0.4 mg Intravenous PRN Effie Berkshire, MD        And  . sodium chloride 0.9 % injection 3 mL  3 mL Intravenous PRN Effie Berkshire, MD      . NIFEdipine (PROCARDIA) capsule 10 mg  10 mg Oral Q4H PRN Delsa Bern, MD   10 mg at 03/24/15 0944  . ondansetron (ZOFRAN) injection 4 mg  4 mg Intravenous Q8H PRN Effie Berkshire, MD      . oxyCODONE-acetaminophen (PERCOCET/ROXICET) 5-325 MG per tablet  1 tablet  1 tablet Oral Q4H PRN Delsa Bern, MD      . oxyCODONE-acetaminophen (PERCOCET/ROXICET) 5-325 MG per tablet 2 tablet  2 tablet Oral Q4H PRN Delsa Bern, MD      . prenatal multivitamin tablet 1 tablet  1 tablet Oral Q1200 Delsa Bern, MD      . senna-docusate (Senokot-S) tablet 2 tablet  2 tablet Oral Q24H Delsa Bern, MD   2 tablet at 03/24/15 0000  . simethicone (MYLICON) chewable tablet 80 mg  80 mg Oral TID PC Delsa Bern, MD      . simethicone [facility]) chewable tablet 80 mg  80 mg Oral Q24H Delsa Bern, MD   80 mg at 03/24/15 0000  . simethicone (MYLICON) chewable tablet 80 mg  80 mg Oral PRN Delsa Bern, MD      . Tdap (BOOSTRIX) injection 0.5 mL  0.5 mL Intramuscular Once Delsa Bern, MD      . zolpidem (AMBIEN) tablet 5 mg  5 mg Oral QHS PRN Delsa Bern, MD        Allergies as of 03/23/2015  . (No Known Allergies)    Family History  Problem Relation Age of Onset  . Hypertension Other   . Colon cancer Neg Hx   . Esophageal cancer Neg Hx   . Rectal cancer Neg Hx   . Stomach cancer Neg Hx     Social History   Social History  . Marital Status: Single    Spouse Name: N/A  . Number of Children: N/A  . Years of Education: N/A   Occupational History  . Not on file.   Social History Main Topics  . Smoking status: Former Smoker -- 0.25 packs/day    Types: Cigarettes    Quit date: 08/10/2014  . Smokeless tobacco: Never Used  . Alcohol Use: No     Comment: occassionally  . Drug Use: No  . Sexual Activity: Yes   Other Topics Concern  . Not on file   Social History Narrative    Review of  Systems: Pertinent positive and negative review of systems were noted in the above HPI section.  All other review of systems was otherwise negative.Marland Kitchen  Physical Exam: Vital signs in last 24 hours: Temp:  [98.1 F (36.7 C)-98.7 F (37.1 C)] 98.2 F (36.8 C) (09/07 0515) Pulse Rate:  [60-92] 63 (09/07 0515) Resp:  [15-23] 18 (09/07 0515) BP: (87-114)/(46-79) 87/47 mmHg (09/07 0515) SpO2:  [97 %-100 %] 99 % (09/07 0055)   General:   Alert,  Well-developed,  AA female well-nourished, pleasant and cooperative in NAD Head:  Normocephalic and atraumatic. Eyes:  Sclera clear, no icterus.   Conjunctiva pink. Ears:  Normal auditory acuity. Nose:  No deformity, discharge,  or lesions. Mouth:  No deformity or lesions.   Neck:  Supple; no masses or thyromegaly. Lungs:  Clear throughout to auscultation.   No wheezes, crackles, or rhonchi. Heart:  Regular rate and rhythm; no murmurs, clicks, rubs,  or gallops. Abdomen:  Soft,nontender, BS active,nonpalp mass or hsm.   Rectal:  Deferred  Msk:  Symmetrical without gross deformities. . Pulses:  Normal pulses noted. Extremities:  Without clubbing or edema. Neurologic:  Alert and  oriented x4;  grossly normal neurologically. Skin:  Intact without significant lesions or rashes.. Psych:  Alert and cooperative. Normal mood and affect.  Intake/Output from previous day: 09/06 0701 - 09/07 0700 In: 4825.6 [P.O.:710; I.V.:3115.6; IV Piggyback:1000] Out: 1490 [Urine:690; Blood:800] Intake/Output this shift: Total I/O  In: 500 [IV Piggyback:500] Out: 25 [Urine:25]  Lab Results:  Recent Labs  03/23/15 1020 03/24/15 0515  WBC 8.3 6.6  HGB 12.4 10.1*  HCT 36.5 30.1*  PLT 218 163   BMET  Recent Labs  03/24/15 0713  NA 134*  K 3.4*  CL 108  CO2 22  GLUCOSE 85  BUN 5*  CREATININE 0.43*  CALCIUM 8.0*   LFT No results for input(s): PROT, ALBUMIN, AST, ALT, ALKPHOS, BILITOT, BILIDIR, IBILI in the last 72 hours. PT/INR No results for  input(s): LABPROT, INR in the last 72 hours. Hepatitis Panel No results for input(s): HEPBSAG, HCVAB, HEPAIGM, HEPBIGM in the last 72 hours.   IMPRESSION:    #1 24 yo AA female with dx of achalasia made by barium swallow and EGD, and very good response to Botox in the past now with progressive sxs over the past few months while pregnant, and much worse x 3 weeks with associated weight loss. Pt is one day s/p  C-section with delivery of a healthy girl.   PLAN: Clear liquids today NPO after midnight  Have scheduled for EGD with Botox with Dr Ardis Hughs  At 9 am [facility] at [facility].Procedure discussed in detail with pt  And she is agreeable to proceed. Hopefully she will have quick response to Botox, as she is planning to nurse.  Will also initiate  referral to  [facility] for [facility] Myotomy for definitive treatment.   Amy Esterwood  03/24/2015, 10:46 AM   ________________________________________________________________________  Velora Heckler GI MD note:  I personally examined the patient, reviewed the data and agree with the assessment and plan described above.  Planning for EGD with botox injection tomorrow at [facility].     Owens Loffler, MD [facility] Pager [phone]

## 2015-03-24 NOTE — Lactation Note (Signed)
This note was copied from the chart of Theresa Briar Sword. Lactation Consultation Note  Patient Name: Theresa [name] FAOZH'Y Date: 03/24/2015 Reason for consult: Initial assessment   Initial consult at 28 hours old; Ga 38.1; BW 5 lbs, 12.4 oz. Mom is P2 with 5 months breastfeeding experience with first child.   Infant has breastfed x6 (15-45 min) + attempts x3 (0-5 min) + formula x3 (3-10 ml); voids-4 in past 24 hours; no stools since birth. Mom had just breastfed for 20 minutes and then bottle fed 13 ml prior to [facility] entering room. Mom denies any pain or concerns at this time.  Taught mom how to hand express colostrum with return demonstration and observation of tiny drops of colostrum appearing on nipple tip. Encouraged mom to breastfeed with each feeding first prior to offering formula and then giving formula based on day of life guideline sheet. Mom has hand pump in room; reports she has an [employer] DEBP at home; does not have WIC but plans to call to get appointment. Lactation brochure given and informed of hospital support group and outpatient services.  Encouraged to call for assistance as needed.     Maternal Data Formula Feeding for Exclusion: Yes Reason for exclusion: Mother's choice to formula and breast feed on admission Has patient been taught Hand Expression?: Yes Does the patient have breastfeeding experience prior to this delivery?: Yes  Feeding Feeding Type: Bottle Fed - Formula Length of feed: 20 min  LATCH Score/Interventions                      Lactation Tools Discussed/Used WIC Program: No   Consult Status Consult Status: Follow-up Date: 03/25/15 Follow-up type: In-patient    Lendon Ka 03/24/2015, 8:55 PM

## 2015-03-24 NOTE — Progress Notes (Signed)
Spoke to [employer], PA, and received orders for clear liquids and NPO after midnight. I spoke to [facility] and set up transportation for 03-24-15, to be at [facility] prior to 0800, per orders.

## 2015-03-24 NOTE — Progress Notes (Signed)
Pt questioned diet order.  Reiterated that the order is for clear liquids and that she will be NPO after midnight. She stated understanding.

## 2015-03-24 NOTE — Progress Notes (Signed)
Emptied 30cc of dark amber urine. Sherre Scarlet CNM notified. No new orders.

## 2015-03-25 ENCOUNTER — Inpatient Hospital Stay (HOSPITAL_COMMUNITY): Payer: Medicaid Other | Admitting: Anesthesiology

## 2015-03-25 ENCOUNTER — Encounter (HOSPITAL_COMMUNITY): Payer: Self-pay | Admitting: *Deleted

## 2015-03-25 ENCOUNTER — Encounter (HOSPITAL_COMMUNITY)
Admission: AD | Disposition: A | Discharge status: Home / Self Care | Payer: Self-pay | Source: Ambulatory Visit | Attending: Obstetrics and Gynecology

## 2015-03-25 HISTORY — PX: ESOPHAGOGASTRODUODENOSCOPY: SHX5428

## 2015-03-25 SURGERY — EGD (ESOPHAGOGASTRODUODENOSCOPY)
Anesthesia: Monitor Anesthesia Care

## 2015-03-25 MED ORDER — PROPOFOL 10 MG/ML IV BOLUS
INTRAVENOUS | Status: AC
  Filled 2015-03-25: qty 20

## 2015-03-25 MED ORDER — OXYCODONE HCL 5 MG/5ML PO SOLN
10.0000 mg | ORAL | Status: DC | PRN
  Administered 2015-03-25 – 2015-03-27 (×5): 10 mg via ORAL
  Filled 2015-03-25 (×6): qty 10

## 2015-03-25 MED ORDER — FENTANYL CITRATE (PF) 100 MCG/2ML IJ SOLN: 25.0000 ug | INTRAMUSCULAR | Status: DC | PRN

## 2015-03-25 MED ORDER — FLUCONAZOLE 10 MG/ML PO SUSR
100.0000 mg | Freq: Every day | ORAL | Status: DC
Start: 2015-03-26 — End: 2015-03-27
  Administered 2015-03-26 – 2015-03-27 (×2): 100 mg via ORAL
  Filled 2015-03-25 (×2): qty 10

## 2015-03-25 MED ORDER — ONABOTULINUMTOXINA 100 UNITS IJ SOLR
100.0000 [IU] | Freq: Once | INTRAMUSCULAR | Status: AC
  Administered 2015-03-25: 100 [IU] via SUBMUCOSAL
  Filled 2015-03-25: qty 100

## 2015-03-25 MED ORDER — FLUCONAZOLE 10 MG/ML PO SUSR
200.0000 mg | Freq: Once | ORAL | Status: AC
  Administered 2015-03-25: 200 mg via ORAL
  Filled 2015-03-25: qty 20

## 2015-03-25 MED ORDER — SODIUM CHLORIDE 0.9 % IJ SOLN
INTRAMUSCULAR | Status: AC
  Filled 2015-03-25: qty 10

## 2015-03-25 MED ORDER — SUCCINYLCHOLINE CHLORIDE 20 MG/ML IJ SOLN
INTRAMUSCULAR | Status: DC | PRN
  Administered 2015-03-25: 100 mg via INTRAVENOUS

## 2015-03-25 MED ORDER — LACTATED RINGERS IV SOLN: INTRAVENOUS | Status: DC

## 2015-03-25 MED ORDER — PROPOFOL INFUSION 10 MG/ML OPTIME
INTRAVENOUS | Status: DC | PRN
  Administered 2015-03-25: 150 ug/kg/min via INTRAVENOUS

## 2015-03-25 MED ORDER — PROPOFOL 10 MG/ML IV BOLUS
INTRAVENOUS | Status: DC | PRN
  Administered 2015-03-25 (×2): 20 mg via INTRAVENOUS
  Administered 2015-03-25: 80 mg via INTRAVENOUS
  Administered 2015-03-25: 20 mg via INTRAVENOUS

## 2015-03-25 MED ORDER — ACETAMINOPHEN 160 MG/5ML PO SOLN
650.0000 mg | ORAL | Status: DC | PRN
  Administered 2015-03-25 – 2015-03-26 (×2): 650 mg via ORAL
  Filled 2015-03-25: qty 20.3

## 2015-03-25 NOTE — Progress Notes (Signed)
PROGRESS NOTE  I have reviewed the patient's vital signs, labs, and notes. I have examined the patient. I agree with the previous note from the Certified Nurse Midwife.  The patient reports that she feels much better at this time. She is tolerating a regular diet. We will plan to discharge to home tomorrow.  Leonard Schwartz, M.D. 03/25/2015

## 2015-03-25 NOTE — Telephone Encounter (Signed)
Message left on the pt phone and letter sent via mychart

## 2015-03-25 NOTE — Progress Notes (Signed)
Addendum Pt return from [facility] for EGD surgery.  Pt c/o cough which makes her incision hurt.  She is still unable to swallow so she is requesting liquid pain meds for now and a total of 2 day, then convert to pills.    Assessment and recommendation from Dr Christella Hartigan, the esophageal mucosa was coated with whitish, cheesy exudate, was suspicious for yeast. Recommendation for diflucan  x1 today and  qd x9 days  Plan to manage incision pain: Roxicet and DC on Percocet.

## 2015-03-25 NOTE — Interval H&P Note (Signed)
History and Physical Interval Note:  03/25/2015 8:55 AM  Theresa [name]  has presented today for surgery, with the diagnosis of achalasia  The various methods of treatment have been discussed with the patient and family. After consideration of risks, benefits and other options for treatment, the patient has consented to  Procedure(s):  EGD with Botox (N/A) as a surgical intervention .  The patient's history has been reviewed, patient examined, no change in status, stable for surgery.  I have reviewed the patient's chart and labs.  Questions were answered to the patient's satisfaction.     [name]

## 2015-03-25 NOTE — H&P (View-Only) (Signed)
  Consultation  Referring Provider:  GYN- Dr. Rivard Primary Care Physician:  PROVIDER NOT IN SYSTEM Primary Gastroenterologist:  Former Dr Brodie  Reason for Consultation:   Cant swallow  HPI: Theresa [name] is a 24 y.o. female  Known to Dr Brodie ,and last seen in our office in August 2016 when she presented with recurrent dysphagia. Patient was in her third trimester of pregnancy at that time and decision was made to hold on any further GI intervention until she delivered. Patient has a diagnosis of achalasia due to findings on prior endoscopies and barium swallow. Patient had a barium swallow in January 2015 which showed a markedly dilated esophagus with a beaked appearance at the GE junction. 13 mm tablet lodged at the GE junction. She has had 2 prior endoscopies per Dr. Brodie the first in January 2015 at which time she was noted to have a hypertensive LES consistent with achalasia. At that time she was several redilated and then started on sublingual Procardia. She had repeat EGD in July 2015 due to recurrence of severe dysphagia. Again found to have a hypertensive LES and was several a dilated followed by Botox injections. She did not follow up until last month. Patient is now hospitalized and delivered her child yesterday by C-section. We are asked to see as she is complaining of dysphagia. She has been using sublingual Procardia 10 mg before meals and at bedtime.. Pt feels the procardia helps with spasm discomfort , but doesn't help much with dysphagia. She relates that the Botox injections done in 01/2014 helped a lot for several months afterward, then had gradual worsening of sxs. Over the past 3 weeks sxs have been terrible with solids and liquids sitting for long periods of time in esophagus, with frequent regurgitation, and spitting up through her nose at night. Weight down 5 pounds past month. She is ready to proceed with myotomy, had been told she would be referred to  [facility].   Past Medical History  Diagnosis Date  . Asthma   . Left fibular fracture 12/2011    referred by ED to outpt ortho. non-surgical mgt.   . Chlamydia age 18  . Obesity (BMI 30-39.9) 07/2013  . Achalasia     Past Surgical History  Procedure Laterality Date  . Cesarean section  12/2008  . Tonsillectomy  07/2007    Dr Chris Newman  . Esophagogastroduodenoscopy (egd) with esophageal dilation N/A 08/06/2013    Procedure: ESOPHAGOGASTRODUODENOSCOPY (EGD) WITH ESOPHAGEAL DILATION;  Surgeon: [name], MD;  Location: [facility] ENDOSCOPY;  Service: Endoscopy;  Laterality: N/A;  . Esophageal manometry N/A 08/18/2013    Procedure: ESOPHAGEAL MANOMETRY (EM);  Surgeon: [name], MD;  Location: [facility] ENDOSCOPY;  Service: Endoscopy;  Laterality: N/A;  . Esophagogastroduodenoscopy N/A 01/26/2014    Procedure: ESOPHAGOGASTRODUODENOSCOPY (EGD);  Surgeon: [name], MD;  Location: [facility] ENDOSCOPY;  Service: Endoscopy;  Laterality: N/A;  . Botox injection N/A 01/26/2014    Procedure: BOTOX INJECTION;  Surgeon: [name], MD;  Location: [facility] ENDOSCOPY;  Service: Endoscopy;  Laterality: N/A;    Prior to Admission medications   Medication Sig Start Date End Date Taking? Authorizing Provider  NIFEdipine (PROCARDIA) 10 MG capsule Take on sublingual before meals TID Patient taking differently: Take 10 mg by mouth every 4 (four) hours as needed (Achalasia Symptoms).  11/09/14  Yes [name], MD  Prenatal Vit-Fe Fumarate-FA (PRENATAL MULTIVITAMIN) TABS tablet Take 1 tablet by mouth daily at 12 noon.   Yes   Historical Provider, MD    Current Facility-Administered Medications  Medication Dose Route Frequency Provider Last Rate Last Dose  . acetaminophen (TYLENOL) tablet 1,000 mg  1,000 mg Oral 4 times per day Kevin D Hollis, MD      . acetaminophen (TYLENOL) tablet 650 mg  650 mg Oral Q4H PRN Sandra Rivard, MD      . witch hazel-glycerin (TUCKS) pad 1 application  1 application Topical PRN Sandra Rivard,  MD       And  . dibucaine (NUPERCAINAL) 1 % rectal ointment 1 application  1 application Rectal PRN Sandra Rivard, MD      . diphenhydrAMINE (BENADRYL) injection 12.5 mg  12.5 mg Intravenous Q4H PRN Kevin D Hollis, MD   12.5 mg at 03/24/15 0045   Or  . diphenhydrAMINE (BENADRYL) capsule 25 mg  25 mg Oral Q4H PRN Kevin D Hollis, MD      . diphenhydrAMINE (BENADRYL) capsule 25 mg  25 mg Oral Q6H PRN Sandra Rivard, MD      . ferrous sulfate tablet 325 mg  325 mg Oral BID WC Sandra Rivard, MD      . ibuprofen (ADVIL,MOTRIN) tablet 600 mg  600 mg Oral 4 times per day Sandra Rivard, MD      . ketorolac (TORADOL) 30 MG/ML injection 30 mg  30 mg Intravenous 4 times per day Kevin D Hollis, MD   30 mg at 03/24/15 0707  . lactated ringers bolus 500 mL  500 mL Intravenous Once Sandra Rivard, MD      . lactated ringers infusion   Intravenous Continuous Sandra Rivard, MD      . lanolin ointment 1 application  1 application Topical PRN Sandra Rivard, MD      . measles, mumps and rubella vaccine (MMR) injection 0.5 mL  0.5 mL Subcutaneous Once Sandra Rivard, MD      . menthol-cetylpyridinium (CEPACOL) lozenge 3 mg  1 lozenge Oral Q2H PRN Sandra Rivard, MD      . methylergonovine (METHERGINE) tablet 0.2 mg  0.2 mg Oral Q4H PRN Sandra Rivard, MD       Or  . methylergonovine (METHERGINE) injection 0.2 mg  0.2 mg Intramuscular Q4H PRN Sandra Rivard, MD      . nalbuphine (NUBAIN) injection 5 mg  5 mg Intravenous Q4H PRN Kevin D Hollis, MD       Or  . nalbuphine (NUBAIN) injection 5 mg  5 mg Subcutaneous Q4H PRN Kevin D Hollis, MD      . nalbuphine (NUBAIN) injection 5 mg  5 mg Intravenous Once PRN Kevin D Hollis, MD       Or  . nalbuphine (NUBAIN) injection 5 mg  5 mg Subcutaneous Once PRN Kevin D Hollis, MD      . naloxone (NARCAN) 2 mg in dextrose 5 % 250 mL infusion  1-4 mcg/kg/hr Intravenous Continuous PRN Kevin D Hollis, MD      . naloxone (NARCAN) injection 0.4 mg  0.4 mg Intravenous PRN Kevin D Hollis, MD        And  . sodium chloride 0.9 % injection 3 mL  3 mL Intravenous PRN Kevin D Hollis, MD      . NIFEdipine (PROCARDIA) capsule 10 mg  10 mg Oral Q4H PRN Sandra Rivard, MD   10 mg at 03/24/15 0944  . ondansetron (ZOFRAN) injection 4 mg  4 mg Intravenous Q8H PRN Kevin D Hollis, MD      . oxyCODONE-acetaminophen (PERCOCET/ROXICET) 5-325 MG per tablet   1 tablet  1 tablet Oral Q4H PRN Sandra Rivard, MD      . oxyCODONE-acetaminophen (PERCOCET/ROXICET) 5-325 MG per tablet 2 tablet  2 tablet Oral Q4H PRN Sandra Rivard, MD      . prenatal multivitamin tablet 1 tablet  1 tablet Oral Q1200 Sandra Rivard, MD      . senna-docusate (Senokot-S) tablet 2 tablet  2 tablet Oral Q24H Sandra Rivard, MD   2 tablet at 03/24/15 0000  . simethicone (MYLICON) chewable tablet 80 mg  80 mg Oral TID PC Sandra Rivard, MD      . simethicone (MYLICON) chewable tablet 80 mg  80 mg Oral Q24H Sandra Rivard, MD   80 mg at 03/24/15 0000  . simethicone (MYLICON) chewable tablet 80 mg  80 mg Oral PRN Sandra Rivard, MD      . Tdap (BOOSTRIX) injection 0.5 mL  0.5 mL Intramuscular Once Sandra Rivard, MD      . zolpidem (AMBIEN) tablet 5 mg  5 mg Oral QHS PRN Sandra Rivard, MD        Allergies as of 03/23/2015  . (No Known Allergies)    Family History  Problem Relation Age of Onset  . Hypertension Other   . Colon cancer Neg Hx   . Esophageal cancer Neg Hx   . Rectal cancer Neg Hx   . Stomach cancer Neg Hx     Social History   Social History  . Marital Status: Single    Spouse Name: N/A  . Number of Children: N/A  . Years of Education: N/A   Occupational History  . Not on file.   Social History Main Topics  . Smoking status: Former Smoker -- 0.25 packs/day    Types: Cigarettes    Quit date: 08/10/2014  . Smokeless tobacco: Never Used  . Alcohol Use: No     Comment: occassionally  . Drug Use: No  . Sexual Activity: Yes   Other Topics Concern  . Not on file   Social History Narrative    Review of  Systems: Pertinent positive and negative review of systems were noted in the above HPI section.  All other review of systems was otherwise negative..  Physical Exam: Vital signs in last 24 hours: Temp:  [98.1 F (36.7 C)-98.7 F (37.1 C)] 98.2 F (36.8 C) (09/07 0515) Pulse Rate:  [60-92] 63 (09/07 0515) Resp:  [15-23] 18 (09/07 0515) BP: (87-114)/(46-79) 87/47 mmHg (09/07 0515) SpO2:  [97 %-100 %] 99 % (09/07 0055)   General:   Alert,  Well-developed,  AA female well-nourished, pleasant and cooperative in NAD Head:  Normocephalic and atraumatic. Eyes:  Sclera clear, no icterus.   Conjunctiva pink. Ears:  Normal auditory acuity. Nose:  No deformity, discharge,  or lesions. Mouth:  No deformity or lesions.   Neck:  Supple; no masses or thyromegaly. Lungs:  Clear throughout to auscultation.   No wheezes, crackles, or rhonchi. Heart:  Regular rate and rhythm; no murmurs, clicks, rubs,  or gallops. Abdomen:  Soft,nontender, BS active,nonpalp mass or hsm.   Rectal:  Deferred  Msk:  Symmetrical without gross deformities. . Pulses:  Normal pulses noted. Extremities:  Without clubbing or edema. Neurologic:  Alert and  oriented x4;  grossly normal neurologically. Skin:  Intact without significant lesions or rashes.. Psych:  Alert and cooperative. Normal mood and affect.  Intake/Output from previous day: 09/06 0701 - 09/07 0700 In: 4825.6 [P.O.:710; I.V.:3115.6; IV Piggyback:1000] Out: 1490 [Urine:690; Blood:800] Intake/Output this shift: Total I/O   In: 500 [IV Piggyback:500] Out: 25 [Urine:25]  Lab Results:  Recent Labs  03/23/15 1020 03/24/15 0515  WBC 8.3 6.6  HGB 12.4 10.1*  HCT 36.5 30.1*  PLT 218 163   BMET  Recent Labs  03/24/15 0713  NA 134*  K 3.4*  CL 108  CO2 22  GLUCOSE 85  BUN 5*  CREATININE 0.43*  CALCIUM 8.0*   LFT No results for input(s): PROT, ALBUMIN, AST, ALT, ALKPHOS, BILITOT, BILIDIR, IBILI in the last 72 hours. PT/INR No results for  input(s): LABPROT, INR in the last 72 hours. Hepatitis Panel No results for input(s): HEPBSAG, HCVAB, HEPAIGM, HEPBIGM in the last 72 hours.   IMPRESSION:    #1 24 yo AA female with dx of achalasia made by barium swallow and EGD, and very good response to Botox in the past now with progressive sxs over the past few months while pregnant, and much worse x 3 weeks with associated weight loss. Pt is one day s/p  C-section with delivery of a healthy girl.   PLAN: Clear liquids today NPO after midnight  Have scheduled for EGD with Botox with Dr Stalin Gruenberg  At 9 am tomorrow at [facility].Procedure discussed in detail with pt  And she is agreeable to proceed. Hopefully she will have quick response to Botox, as she is planning to nurse.  Will also initiate  referral to  [facility] for [facility] Myotomy for definitive treatment.   Amy Esterwood  03/24/2015, 10:46 AM   ________________________________________________________________________  [facility] MD note:  I personally examined the patient, reviewed the data and agree with the assessment and plan described above.  Planning for EGD with botox injection tomorrow at [facility].     Xara Paulding, MD [facility] Pager [phone]  

## 2015-03-25 NOTE — Telephone Encounter (Signed)
We signed off her case- [facility]- please call her next week to give her appt info

## 2015-03-25 NOTE — Anesthesia Procedure Notes (Addendum)
Procedure Name: Intubation Date/Time: 03/25/2015 9:12 AM Performed by: Delphia Grates Pre-anesthesia Checklist: Emergency Drugs available, Suction available, Patient being monitored and Patient identified Patient Re-evaluated:Patient Re-evaluated prior to inductionOxygen Delivery Method: Circle system utilized Intubation Type: IV induction, Rapid sequence and Cricoid Pressure applied Laryngoscope Size: Mac and 4 Grade View: Grade I Tube type: Oral Tube size: 7.5 mm Number of attempts: 1 Airway Equipment and Method: Stylet Placement Confirmation: ETT inserted through vocal cords under direct vision,  breath sounds checked- equal and bilateral and positive ETCO2 Secured at: 21 cm Tube secured with: Tape Dental Injury: Teeth and Oropharynx as per pre-operative assessment  Comments: Emergent intubation d/t emesis. Clear EBBS, cords clear. No damage to teeth.

## 2015-03-25 NOTE — Op Note (Signed)
[facility] [address] [place], [address]   ENDOSCOPY PROCEDURE REPORT  PATIENT: Theresa, [name]  MR#: [id number] BIRTHDATE: 1990-10-04 , 24  yrs. old GENDER: female ENDOSCOPIST: [name], MD PROCEDURE DATE:  03/25/2015 PROCEDURE:  EGD w/ biopsy and EGD w/ directed submucosal injection(s), any substance ASA CLASS:     Class II INDICATIONS:  achalasia (with very good relief after Botox injection several months ago Dr.  Juanda Chance), gradually worsening, acutely worse in hospital after c-section. MEDICATIONS: Per Anesthesia TOPICAL ANESTHETIC: none  DESCRIPTION OF PROCEDURE: After the risks benefits and alternatives of the procedure were thoroughly explained, informed consent was obtained.  The PENTAX GASTOROSCOPE [id number] endoscope was introduced through the mouth and advanced to the second portion of the duodenum , Without limitations.  The instrument was slowly withdrawn as the mucosa was fully examined.  Prior to inserting the endoscope she vomited large amount of liquid and solids.  She coughed a bit but there was no frank aspiration and she did not desaturate.  CRNA elected to proceed with ET intubation which went smoothly and no food particles were noted around cords.  The esophagus was very large diameter with smooth stenosis at the GE junction.  The esophageal mucosa was coated with whitish, cheesy exudate.  This was suspicious for yeast and was sampled with biopsy.  The UGI tract was otherwis normal.  Botulinum Toxin was injected wiht standard sclero needle 1cm above the GE junction in 4 quadrants (25 units each quadrant).  Retroflexed views revealed no abnormalities.     The scope was then withdrawn from the patient and the procedure completed. COMPLICATIONS: There were no immediate complications. ENDOSCOPIC IMPRESSION: Prior to inserting the endoscope she vomited large amount of liquid and solids.  She coughed a bit but there was no frank  aspiration and she did not desaturate.  CRNA elected to proceed with ET intubation which went smoothly and no food particles were noted around cords.  The esophagus was very large diameter with smooth stenosis at the GE junction.  The esophageal mucosa was coated with whitish, cheesy exudate.  This was suspicious for yeast and was sampled with biopsy.  The UGI tract was otherwis normal.  Botulinum Toxin was injected wiht standard sclero needle 1cm above the GE junction in 4 quadrants (25 units each quadrant)  RECOMMENDATIONS: Will empicially start diflucan (  once daily, then  daily for 9 more days).  My office is arranging referral to [facility] Dr.  Abbey Chatters to consider [facility] Myotomy. Ok to advance diet as tolerated.   eSigned:  [name], MD 03/25/2015 9:39 AM

## 2015-03-25 NOTE — Anesthesia Preprocedure Evaluation (Addendum)
Anesthesia Evaluation  Patient identified by MRN, date of birth, ID band Patient awake    Reviewed: Allergy & Precautions, H&P , NPO status , Patient's Chart, lab work & pertinent test results  Airway Mallampati: II  TM Distance: >3 FB Neck ROM: full    Dental no notable dental hx. (+) Teeth Intact, Dental Advisory Given   Pulmonary asthma , Current Smoker, former smoker,    Pulmonary exam normal breath sounds clear to auscultation       Cardiovascular Exercise Tolerance: Good negative cardio ROS Normal cardiovascular exam Rhythm:regular Rate:Normal     Neuro/Psych negative neurological ROS  negative psych ROS   GI/Hepatic Neg liver ROS, GERD  Medicated and Controlled,achalasia   Endo/Other  negative endocrine ROS  Renal/GU negative Renal ROS  negative genitourinary   Musculoskeletal   Abdominal   Peds  Hematology negative hematology ROS (+)   Anesthesia Other Findings   Reproductive/Obstetrics negative OB ROS                           Anesthesia Physical Anesthesia Plan  ASA: II  Anesthesia Plan: MAC   Post-op Pain Management:    Induction:   Airway Management Planned:   Additional Equipment:   Intra-op Plan:   Post-operative Plan:   Informed Consent:   Plan Discussed with: Surgeon  Anesthesia Plan Comments:         Anesthesia Quick Evaluation

## 2015-03-25 NOTE — Transfer of Care (Signed)
Immediate Anesthesia Transfer of Care Note  Patient: Theresa [name]  Procedure(s) Performed: Procedure(s):  EGD with Botox (N/A)  Patient Location: PACU and Endoscopy Unit  Anesthesia Type:General  Level of Consciousness: awake, alert , oriented and patient cooperative  Airway & Oxygen Therapy: Patient Spontanous Breathing and Patient connected to face mask oxygen  Post-op Assessment: Report given to RN and Post -op Vital signs reviewed and stable  Post vital signs: Reviewed and stable  Last Vitals:  Filed Vitals:   03/25/15 0822  BP: 101/70  Pulse:   Temp: 36.8 C  Resp: 10    Complications: No apparent anesthesia complications

## 2015-03-25 NOTE — Telephone Encounter (Signed)
Pt has an appt with Dr Abbey Chatters on 04/12/15 at 1 pm.  Amy will you notify pt prior to discharge?

## 2015-03-25 NOTE — Anesthesia Postprocedure Evaluation (Signed)
  Anesthesia Post-op Note  Patient: Theresa [name]  Procedure(s) Performed: Procedure(s) (LRB):  EGD with Botox (N/A)  Patient Location: PACU  Anesthesia Type: General  Level of Consciousness: awake and alert   Airway and Oxygen Therapy: Patient Spontanous Breathing  Post-op Pain: mild  Post-op Assessment: Post-op Vital signs reviewed, Patient's Cardiovascular Status Stable, Respiratory Function Stable, Patent Airway and No signs of Nausea or vomiting  Last Vitals:  Filed Vitals:   03/25/15 1055  BP:   Pulse: 55  Temp:   Resp: 13    Post-op Vital Signs: stable   Complications: No apparent anesthesia complications

## 2015-03-25 NOTE — Progress Notes (Signed)
Subjective: Postpartum Day 2: Cesarean Delivery due to repeat Patient up ad lib, reports no syncope or dizziness. Feeding:  breast Contraceptive plan:  Note addressed+ Report being hungry  Objective: Vital signs in last 24 hours: Temp:  [98.2 F (36.8 C)] 98.2 F (36.8 C) (09/08 0508) Pulse Rate:  [56] 56 (09/08 0508) Resp:  [16] 16 (09/08 0508) BP: (91-93)/(50-52) 91/50 mmHg (09/08 0508)  Physical Exam:  General: alert and cooperative Lochia: appropriate Uterine Fundus: firm Abdomen:  + bowel sounds, non distended Incision: no significant drainage  Honeycomb dressing CDI DVT Evaluation: No evidence of DVT seen on physical exam. Homan's sign: Negative   Recent Labs  03/23/15 1020 03/24/15 0515  HGB 12.4 10.1*  HCT 36.5 30.1*  WBC 8.3 6.6    Assessment: Status post Cesarean section day 2. Doing well postoperatively.  Honeycomb dressing in place, no significant drainage Anemia - hemodynamicly stable.     Plan: Continue current care. Pt being transferred to [facility] for surgery for EGD with Botox with Dr Christella Hartigan At 9 am  Remove outer dressing     Delaynee Alred, CNM, MSN 03/25/2015. 8:26 AM

## 2015-03-26 MED ORDER — FLUCONAZOLE 10 MG/ML PO SUSR: 100.0000 mg | Freq: Every day | ORAL | Status: DC

## 2015-03-26 MED ORDER — ACETAMINOPHEN 160 MG/5ML PO SOLN: 650.0000 mg | ORAL | Status: DC | PRN

## 2015-03-26 MED ORDER — OXYCODONE HCL 5 MG/5ML PO SOLN: 10.0000 mg | ORAL | Status: DC | PRN

## 2015-03-26 MED ORDER — IBUPROFEN 100 MG/5ML PO SUSP: 600.0000 mg | Freq: Four times a day (QID) | ORAL | Status: DC | PRN

## 2015-03-26 NOTE — Discharge Summary (Addendum)
Theresa [name]  Theresa [name]  DOB:    [birth year] MRN:    [id number] CSN:    [id number]  Date of admission:                  03/23/15  Date of discharge:                   03/27/15  Procedures this admission:  Repeat LTCS, EGD with biopsy and EGD with directed injection of Botox for severe achalasia  Date of Delivery: 03/23/15  Newborn Data:  Live born female  Birth Weight: 5 lb 12.4 oz (2620 g) APGAR: 9, 9  Home with mother. Name: Theresa [name]   History of Present Illness:  Theresa [name] is a 24 y.o. female, [id number], who presents at [redacted]w[redacted]d weeks gestation. The patient has been followed at [facility] and Gynecology division of [facility]   Her pregnancy has been complicated by:  Patient Active Problem List   Diagnosis Date Noted  . Achalasia 03/24/2015  . 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 Status post repeat low transverse Theresa section 03/23/2015  . Theresa delivery delivered 03/23/2015  . H/O: depression 11/24/2014  . H/O: C-section - 2010 - NRFHRT 11/23/2014  . Breast lump 11/23/2014  . Infection due to enterococcus - at [facility] - treated 11/23/2014  . Vomiting 08/06/2013  . Tobacco abuse 08/06/2013  . Achalasia, esophageal 08/06/2013    Hospital Course--Repeat C/S:  Admitting Dx:  IUP at 38 3/7 weeks, previous Theresa, severe achalasia Rationale for C/S: Previous C/S, desires repeat Anesthesia:  Spinal Surgeon:  Dr. Estanislado Pandy for C/S, Dr. Christella Hartigan for EGD Complications: None  Seen in MAU 9/6 for severe achlasia, with inability to eat or drink for several days.  Hx previous C/S, with desire for repeat.  Decision made to proceed with C/S, then have GI consult for consideration of therapeutic alternatives for severe achalasia.  Repeat C/S was performed by Dr. Estanislado Pandy without complication on 03/23/15. Dr. Christella Hartigan, [facility], saw the patient on 9/7 and recommended proceeding with EGD, bx, and injection of botulinum toxin  for interval treatment of her achalasia.  This procedure was done on 9/8 at [facility], without complications.  By day 3 post-op from C/s, patient was tolerating a light/regular diet and was able to take in fluids without difficulty.  She did still have a mild sore throat, but this responded to fluids and throat lozegens.  She had good pain control from liquid ibuprophen and liquid roxicet.  She had been released by the GI MDs for d/c, with plan for f/u with Dr. Abbey Chatters on 04/12/15 at 1pm for evaluation for [facility] Myotomy.  She was placed on Diflucan 100 mg po x 9 days.  Per [facility] d/c plan, she was continued on Ibuprophen and Roxicet for post-op pain. Baby was kept an additional day due to low temp, so to ensure continuation of the patient's medication regimen, patient's d/c was delayed until 03/27/15.  On day 4, she was doing well, and baby was expected to be d/c'd so the patient was d/c'd home on 03/27/15 in good condition.  Intrapartum Procedures: Theresa: low cervical, transverse--repeat Postpartum Procedures: EGD for bx and injection of Botox Complications-Operative and Postpartum: none  Discharge Diagnoses: Term Pregnancy-delivered and previous Theresa section, desired repeat, severe achalasia treated with EGD and injection of botulinum toxin  Feeding:  breast  Contraception:  Considering Depo at 6 weeks--will call if desires Rx to be sent before 6  week visit.  Hemoglobin Results:  CBC CBC Latest Ref Rng 03/24/2015 03/23/2015 12/24/2014  WBC 4.0 - 10.5 K/uL 6.6 8.3 7.9  Hemoglobin 12.0 - 15.0 g/dL 10.1(L) 12.4 11.2(L)  Hematocrit 36.0 - 46.0 % 30.1(L) 36.5 32.0(L)  Platelets 150 - 400 K/uL 163 218 183      Discharge Physical Exam:   General: alert  Swallowing without difficulty, slight sore throat Lochia: appropriate Uterine Fundus: firm Abdomen:  + bowel sounds Incision: healing well, with Honeycomb dressing CDI DVT Evaluation: No evidence of DVT seen on physical  exam. Negative Homan's sign.  Discharge Information:  Activity:           pelvic rest Diet:                routine--advance slowly Medications: Ibuprofen and Roxicet liquid, Diflucan 100 mg po x 9 days. Condition:      stable, and improved Instructions:  PP instructions  Discharge to: home  Follow-up Information    Follow up with [facility] In 6 weeks.   Specialty:  Obstetrics and Gynecology   Why:  F/u with [facility] as needed for any questions or concerns.  F/u with surgeon as planned.   Contact information:   [address] [place] [address] [phone]      Follow up with Adolph Pollack, MD On 04/12/2015.   Specialty:  General Surgery   Why:  Appt is at [facility] information:   [address] [place] [address] [phone]        Nigel Bridgeman CNM 03/27/2015 10:15 AM

## 2015-03-26 NOTE — Lactation Note (Signed)
This note was copied from the chart of Theresa Audery Wassenaar. Lactation Consultation Note  Follow up with mom prior to d/c. Baby is nursing at the breast better today and is wanting to cluster feed at times. Will also go to sleep at breast. Encouraged mom to stimulate during feeding to get in a good feeding. Baby did not nurse when I was in the room. Baby with increased voids and stools this morning. Stools are meconium. Mom's breasts are much fuller today and breasts are shiny and firm to touch (right >left). Engorgement protocol handout and ice packs given with instructions to feed and /or pump at least  q 3 hours with baby's goal of 8-12 feeds in 24 hours. Told mom to supplement baby with EBM prior to formula, 6-8 times a day. Reviewed normal supplement amounts for day of age. Baby is down 7 % and has received some formula supplement, but none since last night per mom. Mom did pump 10 cc this am and was fed to baby via bottle. Normal NB BF behavior and output discussed. Mom to be sent home with [facility] loaner pump and has a [facility] appt on 9/19 @ 9:50 am. Pump referral faxed to [facility] office. BM Storage quidelines referred to in Taking Care of Baby and Me handout. Mom is aware of LC out patient services and support groups. Told to call with questions or concerns after d/c.  Patient Name: Theresa [name] ZOXWR'U Date: 03/26/2015     Maternal Data    Feeding Feeding Type: Breast Fed Length of feed: 10 min  LATCH Score/Interventions Latch: Grasps breast easily, tongue down, lips flanged, rhythmical sucking. Intervention(s): Adjust position;Assist with latch  Audible Swallowing: Spontaneous and intermittent Intervention(s): Skin to skin Intervention(s): Skin to skin  Type of Nipple: Everted at rest and after stimulation  Comfort (Breast/Nipple): Filling, red/small blisters or bruises, mild/mod discomfort  Problem noted: Mild/Moderate discomfort Interventions (Filling): Massage;Double  electric pump Interventions (Mild/moderate discomfort): Hand expression;Hand massage;Post-pump  Hold (Positioning): Assistance needed to correctly position infant at breast and maintain latch.  LATCH Score: 8  Lactation Tools Discussed/Used     Consult Status      Theresa [name] 03/26/2015, 10:08 AM

## 2015-03-26 NOTE — Discharge Instructions (Signed)

## 2015-03-26 NOTE — Lactation Note (Signed)
This note was copied from the chart of Theresa [name]. Lactation Consultation Note  To moms room to rent pump and mom was crying saying that baby is unable to be D/C due to decreased temp over night. Encouragement given to mom to continue BF and pumping to relieve engorgement and to place baby STS for temp regulation. Will follow up tomorrow or prn.  Patient Name: Theresa [name] ZOXWR'U Date: 03/26/2015     Maternal Data    Feeding Feeding Type: Breast Fed Length of feed: 10 min  LATCH Score/Interventions Latch: Grasps breast easily, tongue down, lips flanged, rhythmical sucking. Intervention(s): Adjust position;Assist with latch  Audible Swallowing: Spontaneous and intermittent Intervention(s): Skin to skin Intervention(s): Skin to skin  Type of Nipple: Everted at rest and after stimulation  Comfort (Breast/Nipple): Filling, red/small blisters or bruises, mild/mod discomfort  Problem noted: Mild/Moderate discomfort Interventions (Filling): Massage;Double electric pump Interventions (Mild/moderate discomfort): Hand expression;Hand massage;Post-pump  Hold (Positioning): Assistance needed to correctly position infant at breast and maintain latch.  LATCH Score: 8  Lactation Tools Discussed/Used     Consult Status      Ed Blalock 03/26/2015, 11:44 AM

## 2015-03-26 NOTE — Clinical Social Work Maternal (Signed)
CLINICAL SOCIAL WORK MATERNAL/CHILD NOTE  Patient Details  Name: Theresa [name] MRN: [id number] Date of Birth: 03/12/1991  Date:  October 26, 2014  Clinical Social Worker Initiating Note:  Loleta Books, LCSW Date/ Time Initiated:  03/26/15/0930     Child's Name:  Michelle Piper   Legal Guardian:  Theresa [name] (mother) and Genene Churn (father)  Need for Interpreter:  None   Date of Referral:  07/21/14     Reason for Referral:    History of postpartum depression  Referral Source:  [facility]   Address:  [address] [place], [place] [address]  Phone number:  [phone]   Household Members:  Minor Children, Significant Other, Relatives   Natural Supports (not living in the home):  Immediate Family, Extended Family   Professional Supports: Professors at [employer]. Per MOB, she feels supported by her professors.  Employment: Consulting civil engineer   Type of Work:     Education:  Attending college at [employer], pursuing a degree in family studies and child Stage manager Resources:  [employer]   Other Resources:  [facility]   Cultural/Religious Considerations Which May Impact Care:  None reported  Strengths:  Ability to meet basic needs , Home prepared for child , Pediatrician chosen    Risk Factors/Current Problems:   1) History of postpartum depression: MOB reported symptoms for 6 months in 2010 after her first child was born. MOB presents with awareness of need to monitor for symptoms postpartum. MOB denied perinatal mood and anxiety disorder symptoms during this pregnancy.  2) MOB reported that she will be balancing motherhood and college. She endorsed strong support, but identified her responsibilities as a source of stress.  Cognitive State:  Able to Concentrate , Alert , Goal Oriented , Linear Thinking , Insightful    Mood/Affect:  Animated, Happy , Interested , Calm , Comfortable    CSW Assessment:  CSW received request for consult due to MOB presenting with a history of  postpartum depression.  CSW attempted to meet with MOB on 9/8; however, MOB transferred to [facility] for surgery and then had numerous visitors in her room.  MOB presented as easily engaged and receptive to the visit. She displayed a full range in affect and was in a pleasant mood.  MOB openly discussed her mental health history, and provided consent for the FOB to remain in the room during the assessment.   Per MOB, she experienced postpartum depression for 6 months after her first child was born in 2010. She stated that she was overwhelmed since she was a young mother, had minimal support, and "had a lot going on".  MOB shared that she sought out help since she knew "something was wrong", and reported that she participated in therapy.  MOB reported that she feels "better" and more "prepared" as she transitions postpartum with this infant. She stated that since her first child has been born, she has moved to [facility]. MOB reported that she feels more supported by the FOB and her family.  MOB discussed perceptions that she is more prepared, and has noted no mental health concerns during the pregnancy.  She shared that her grandmother has also recently moved from [place] to [place] in order to provide her with additional support.  Per MOB, she has already discussed with the FOB about her concerns for developing postpartum depression since she wants him to help her monitor her mood. She expressed normative fear of developing symptoms again since, "I never want to go through that again".  MOB's comments highlight her motivation and intention to seek help if needs arise. MOB expressed self-awareness and shared that she has no difficulty expressing her feelings to others. She stated that she is aware of the importance of externalizing her feelings.  MOB acknowledged the need to engage in self-care, and was receptive to exploring how to incorporate these activities into her routine.  She shared that she is aware of the need to care for  herself, but is concerned since she will be balancing school work, caring for herself, and caring for her children.   Despite the numerous responsibilities, she expressed commitment to creating opportunities to relax on a regular basis.  MOB acknowledged importance of notifying her medical provider if she notes onset of symptoms as soon as possible.  CSW informed MOB of available counseling and mental health resources at [employer]. MOB expressed interest in reaching out to the counseling center on campus if needs arise. MOB also inquired about CSW contact information if needs arise, CSW provided. CSW also informed MOB of support group at [facility], and she indicated interest.  MOB denied additional questions, concerns, or needs at this time. She expressed appreciation for the information, visit, and support, and agreed to contact CSW as needs arise.  CSW Plan/Description:   1)Patient/Family Education: Perinatal mood and anxiety disorders 2)Information/Referral to [employer]: Feelings After Birth support group, Counseling services at [employer] (where MOB attends college) 3)No Further Intervention Required/No Barriers to Discharge    Kelby Fam June 02, 2015, 9:47 AM

## 2015-03-27 NOTE — Lactation Note (Signed)
This note was copied from the chart of Theresa [name]. Lactation Consultation Note  Follow up with mom. Baby is breastfeeding well and has has 10 + Bf in last 24 hours with 6 voids and 4 stools. Mom reports she hears swallows and is having let down from opposite breast while feeding. Mom did pump one bottle for dad to feed infant so she could rest last night, otherwise she has not pumped. Mom and dad were pleased that infants weight increased 15 grams and her voids and stools are increasing. Discussed normalcy of cluster feeds with mom. Engorgement reviewed, mom reports she is softer today and the milk is flowing better. Told her once engorgement resolved to d/c ice packs. Encouraged parents to feed 8-12 times in 24 hours while keeping record of intake and output. Pump rented until able to get to [facility] appointment on 9/19. Mom is currently in school and will need to pump for some feedings. Baby to have Pediatrician appt on Monday for follow up. Mom is aware of LC outpatient services and support groups and has LC phone #, advised her to call with any questions or concerns.  Patient Name: Theresa [name] ZOXWR'U Date: 03/27/2015 Reason for consult: Follow-up assessment;Infant < 6lbs;Pump rental   Maternal Data    Feeding Feeding Type: Breast Fed  LATCH Score/Interventions                      Lactation Tools Discussed/Used WIC Program:  (Has [facility] appt Sept 19) Pump Review: Setup, frequency, and cleaning;Milk Storage   Consult Status Consult Status: Complete    Silas Flood Hice 03/27/2015, 8:32 AM

## 2015-03-30 ENCOUNTER — Encounter (HOSPITAL_COMMUNITY): Payer: Self-pay | Admitting: Gastroenterology

## 2015-04-03 ENCOUNTER — Encounter (HOSPITAL_BASED_OUTPATIENT_CLINIC_OR_DEPARTMENT_OTHER): Payer: Self-pay | Admitting: Emergency Medicine

## 2015-04-03 ENCOUNTER — Emergency Department (HOSPITAL_BASED_OUTPATIENT_CLINIC_OR_DEPARTMENT_OTHER)
Admission: EM | Admit: 2015-04-03 | Discharge: 2015-04-04 | Disposition: A | Discharge status: Home / Self Care | Payer: Medicaid Other | Attending: Emergency Medicine | Admitting: Emergency Medicine

## 2015-04-03 DIAGNOSIS — Y9389 Activity, other specified: Secondary | ICD-10-CM | POA: Diagnosis not present

## 2015-04-03 DIAGNOSIS — E669 Obesity, unspecified: Secondary | ICD-10-CM | POA: Insufficient documentation

## 2015-04-03 DIAGNOSIS — Z8619 Personal history of other infectious and parasitic diseases: Secondary | ICD-10-CM | POA: Diagnosis not present

## 2015-04-03 DIAGNOSIS — S0990XA Unspecified injury of head, initial encounter: Secondary | ICD-10-CM | POA: Diagnosis not present

## 2015-04-03 DIAGNOSIS — Z8781 Personal history of (healed) traumatic fracture: Secondary | ICD-10-CM | POA: Insufficient documentation

## 2015-04-03 DIAGNOSIS — Z8719 Personal history of other diseases of the digestive system: Secondary | ICD-10-CM | POA: Insufficient documentation

## 2015-04-03 DIAGNOSIS — Z79899 Other long term (current) drug therapy: Secondary | ICD-10-CM | POA: Insufficient documentation

## 2015-04-03 DIAGNOSIS — Y92410 Unspecified street and highway as the place of occurrence of the external cause: Secondary | ICD-10-CM | POA: Diagnosis not present

## 2015-04-03 DIAGNOSIS — S301XXA Contusion of abdominal wall, initial encounter: Secondary | ICD-10-CM | POA: Diagnosis not present

## 2015-04-03 DIAGNOSIS — Y998 Other external cause status: Secondary | ICD-10-CM | POA: Insufficient documentation

## 2015-04-03 DIAGNOSIS — J45909 Unspecified asthma, uncomplicated: Secondary | ICD-10-CM | POA: Diagnosis not present

## 2015-04-03 DIAGNOSIS — Z87891 Personal history of nicotine dependence: Secondary | ICD-10-CM | POA: Diagnosis not present

## 2015-04-03 DIAGNOSIS — S3991XA Unspecified injury of abdomen, initial encounter: Secondary | ICD-10-CM | POA: Diagnosis present

## 2015-04-03 MED ORDER — ACETAMINOPHEN 160 MG/5ML PO SOLN
1000.0000 mg | Freq: Once | ORAL | Status: AC
  Administered 2015-04-03: 1000 mg via ORAL
  Filled 2015-04-03: qty 40.6

## 2015-04-03 MED ORDER — ACETAMINOPHEN 500 MG PO TABS
ORAL_TABLET | ORAL | Status: AC
  Filled 2015-04-03: qty 2

## 2015-04-03 MED ORDER — ACETAMINOPHEN 500 MG PO TABS: 1000.0000 mg | ORAL_TABLET | Freq: Once | ORAL | Status: DC
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
## 2015-08-09 ENCOUNTER — Inpatient Hospital Stay (HOSPITAL_COMMUNITY)
Admission: AD | Admit: 2015-08-09 | Discharge: 2015-08-09 | Disposition: A | Discharge status: Home / Self Care | Payer: Medicaid Other | Source: Ambulatory Visit | Attending: Obstetrics and Gynecology | Admitting: Obstetrics and Gynecology

## 2015-08-09 ENCOUNTER — Encounter (HOSPITAL_COMMUNITY): Payer: Self-pay | Admitting: *Deleted

## 2015-08-09 DIAGNOSIS — N898 Other specified noninflammatory disorders of vagina: Secondary | ICD-10-CM | POA: Insufficient documentation

## 2015-08-09 DIAGNOSIS — F17210 Nicotine dependence, cigarettes, uncomplicated: Secondary | ICD-10-CM | POA: Insufficient documentation

## 2015-08-09 DIAGNOSIS — B9689 Other specified bacterial agents as the cause of diseases classified elsewhere: Secondary | ICD-10-CM

## 2015-08-09 DIAGNOSIS — Z3042 Encounter for surveillance of injectable contraceptive: Secondary | ICD-10-CM

## 2015-08-09 DIAGNOSIS — N760 Acute vaginitis: Secondary | ICD-10-CM

## 2015-08-09 LAB — WET PREP, GENITAL
Sperm: NONE SEEN
TRICH WET PREP: NONE SEEN
Yeast Wet Prep HPF POC: NONE SEEN

## 2015-08-09 LAB — POCT PREGNANCY, URINE: Preg Test, Ur: NEGATIVE

## 2015-08-09 MED ORDER — MEDROXYPROGESTERONE ACETATE 150 MG/ML IM SUSP: 150.0000 mg | Freq: Once | INTRAMUSCULAR | Status: DC

## 2015-08-09 MED ORDER — MEDROXYPROGESTERONE ACETATE 150 MG/ML IM SUSP
150.0000 mg | Freq: Once | INTRAMUSCULAR | Status: AC
  Administered 2015-08-09: 150 mg via INTRAMUSCULAR
  Filled 2015-08-09: qty 1

## 2015-08-09 MED ORDER — METRONIDAZOLE 0.75 % VA GEL: 1.0000 | Freq: Every day | VAGINAL | Status: DC
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
## 2015-08-09 NOTE — MAU Note (Signed)
having a lot of vaginal discharge and it itches really bad.  Thinks it is yeast, BV or both

## 2015-08-09 NOTE — Discharge Instructions (Signed)
Medroxyprogesterone injection [Contraceptive] What is this medicine? MEDROXYPROGESTERONE (me DROX ee proe JES te rone) contraceptive injections prevent pregnancy. They provide effective birth control for 3 months. Depo-subQ Provera 104 is also used for treating pain related to endometriosis. This medicine may be used for other purposes; ask your health care provider or pharmacist if you have questions. What should I tell my health care provider before I take this medicine? They need to know if you have any of these conditions: -frequently drink alcohol -asthma -blood vessel disease or a history of a blood clot in the lungs or legs -bone disease such as osteoporosis -breast cancer -diabetes -eating disorder (anorexia nervosa or bulimia) -high blood pressure -HIV infection or AIDS -kidney disease -liver disease -mental depression -migraine -seizures (convulsions) -stroke -tobacco smoker -vaginal bleeding -an unusual or allergic reaction to medroxyprogesterone, other hormones, medicines, foods, dyes, or preservatives -pregnant or trying to get pregnant -breast-feeding How should I use this medicine? Depo-Provera Contraceptive injection is given into a muscle. Depo-subQ Provera 104 injection is given under the skin. These injections are given by a health care professional. You must not be pregnant before getting an injection. The injection is usually given during the first 5 days after the start of a menstrual period or 6 weeks after delivery of a baby. Talk to your pediatrician regarding the use of this medicine in children. Special care may be needed. These injections have been used in female children who have started having menstrual periods. Overdosage: If you think you have taken too much of this medicine contact a poison control center or emergency room at once. NOTE: This medicine is only for you. Do not share this medicine with others. What if I miss a dose? Try not to miss a  dose. You must get an injection once every 3 months to maintain birth control. If you cannot keep an appointment, call and reschedule it. If you wait longer than 13 weeks between Depo-Provera contraceptive injections or longer than 14 weeks between Depo-subQ Provera 104 injections, you could get pregnant. Use another method for birth control if you miss your appointment. You may also need a pregnancy test before receiving another injection. What may interact with this medicine? Do not take this medicine with any of the following medications: -bosentan This medicine may also interact with the following medications: -aminoglutethimide -antibiotics or medicines for infections, especially rifampin, rifabutin, rifapentine, and griseofulvin -aprepitant -barbiturate medicines such as phenobarbital or primidone -bexarotene -carbamazepine -medicines for seizures like ethotoin, felbamate, oxcarbazepine, phenytoin, topiramate -modafinil -St. John's wort This list may not describe all possible interactions. Give your health care provider a list of all the medicines, herbs, non-prescription drugs, or dietary supplements you use. Also tell them if you smoke, drink alcohol, or use illegal drugs. Some items may interact with your medicine. What should I watch for while using this medicine? This drug does not protect you against HIV infection (AIDS) or other sexually transmitted diseases. Use of this product may cause you to lose calcium from your bones. Loss of calcium may cause weak bones (osteoporosis). Only use this product for more than 2 years if other forms of birth control are not right for you. The longer you use this product for birth control the more likely you will be at risk for weak bones. Ask your health care professional how you can keep strong bones. You may have a change in bleeding pattern or irregular periods. Many females stop having periods while taking this drug. If you have  received your  injections on time, your chance of being pregnant is very low. If you think you may be pregnant, see your health care professional as soon as possible. Tell your health care professional if you want to get pregnant within the next year. The effect of this medicine may last a long time after you get your last injection. What side effects may I notice from receiving this medicine? Side effects that you should report to your doctor or health care professional as soon as possible: -allergic reactions like skin rash, itching or hives, swelling of the face, lips, or tongue -breast tenderness or discharge -breathing problems -changes in vision -depression -feeling faint or lightheaded, falls -fever -pain in the abdomen, chest, groin, or leg -problems with balance, talking, walking -unusually weak or tired -yellowing of the eyes or skin Side effects that usually do not require medical attention (report to your doctor or health care professional if they continue or are bothersome): -acne -fluid retention and swelling -headache -irregular periods, spotting, or absent periods -temporary pain, itching, or skin reaction at site where injected -weight gain This list may not describe all possible side effects. Call your doctor for medical advice about side effects. You may report side effects to FDA at 1-800-FDA-1088. Where should I keep my medicine? This does not apply. The injection will be given to you by a health care professional. NOTE: This sheet is a summary. It may not cover all possible information. If you have questions about this medicine, talk to your doctor, pharmacist, or health care provider.    2016, Elsevier/Gold Standard. (2008-07-24 18:37:56)  Bacterial Vaginosis Bacterial vaginosis is a vaginal infection that occurs when the normal balance of bacteria in the vagina is disrupted. It results from an overgrowth of certain bacteria. This is the most common vaginal infection in women  of childbearing age. Treatment is important to prevent complications, especially in pregnant women, as it can cause a premature delivery. CAUSES  Bacterial vaginosis is caused by an increase in harmful bacteria that are normally present in smaller amounts in the vagina. Several different kinds of bacteria can cause bacterial vaginosis. However, the reason that the condition develops is not fully understood. RISK FACTORS Certain activities or behaviors can put you at an increased risk of developing bacterial vaginosis, including:  Having a new sex partner or multiple sex partners.  Douching.  Using an intrauterine device (IUD) for contraception. Women do not get bacterial vaginosis from toilet seats, bedding, swimming pools, or contact with objects around them. SIGNS AND SYMPTOMS  Some women with bacterial vaginosis have no signs or symptoms. Common symptoms include:  Grey vaginal discharge.  A fishlike odor with discharge, especially after sexual intercourse.  Itching or burning of the vagina and vulva.  Burning or pain with urination. DIAGNOSIS  Your health care provider will take a medical history and examine the vagina for signs of bacterial vaginosis. A sample of vaginal fluid may be taken. Your health care provider will look at this sample under a microscope to check for bacteria and abnormal cells. A vaginal pH test may also be done.  TREATMENT  Bacterial vaginosis may be treated with antibiotic medicines. These may be given in the form of a pill or a vaginal cream. A second round of antibiotics may be prescribed if the condition comes back after treatment. Because bacterial vaginosis increases your risk for sexually transmitted diseases, getting treated can help reduce your risk for chlamydia, gonorrhea, HIV, and herpes. HOME CARE INSTRUCTIONS     Only take over-the-counter or prescription medicines as directed by your health care provider.  If antibiotic medicine was prescribed,  take it as directed. Make sure you finish it even if you start to feel better.  Tell all sexual partners that you have a vaginal infection. They should see their health care provider and be treated if they have problems, such as a mild rash or itching.  During treatment, it is important that you follow these instructions:  Avoid sexual activity or use condoms correctly.  Do not douche.  Avoid alcohol as directed by your health care provider.  Avoid breastfeeding as directed by your health care provider. SEEK MEDICAL CARE IF:   Your symptoms are not improving after 3 days of treatment.  You have increased discharge or pain.  You have a fever. MAKE SURE YOU:   Understand these instructions.  Will watch your condition.  Will get help right away if you are not doing well or get worse. FOR MORE INFORMATION  Centers for Disease Control and Prevention, Division of STD Prevention: www.cdc.gov/std American Sexual Health Association (ASHA): www.ashastd.org    This information is not intended to replace advice given to you by your health care provider. Make sure you discuss any questions you have with your health care provider.   Document Released: 07/03/2005 Document Revised: 07/24/2014 Document Reviewed: 02/12/2013 Elsevier Interactive Patient Education 2016 Elsevier Inc.  

## 2015-08-10 LAB — GC/CHLAMYDIA PROBE AMP (~~LOC~~) NOT AT ARMC
Chlamydia: NEGATIVE
Neisseria Gonorrhea: NEGATIVE

## 2015-08-20 ENCOUNTER — Telehealth: Payer: Self-pay | Admitting: Internal Medicine

## 2015-08-20 ENCOUNTER — Telehealth: Payer: Self-pay | Admitting: *Deleted

## 2015-08-20 NOTE — Telephone Encounter (Signed)
Spoke with [facility] and scheduled OV for patient on 09/07/15 at 9:45 AM with Dr. Abbey Chatters. Patient aware.

## 2015-08-20 NOTE — Telephone Encounter (Signed)
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 She was seen inpatient only by Dr. Christella Hartigan. She states she was not called back. Told patient her voice mail is full and I had tried to call her. She states that she lost her insurance and could not keep the [facility] appointment that was made. She states she is having problems again and would like to be referred again. Told patient the referral can be sent again. She also states she needs to paper for Medicaid stating she needs to have surgery. Patient became very agitated, talking loudly and crying when she was told MD will have to ok the letter and it will be Monday before it is ready. Patient called [facility] again and was yelling into phone to them also. Patient left office. Is it ok for a letter to be written for patient stating her condition?

## 2015-08-20 NOTE — Telephone Encounter (Signed)
Reached voice mail. Mail box is full unable to leave a message.

## 2015-08-23 ENCOUNTER — Encounter: Payer: Self-pay | Admitting: *Deleted

## 2015-08-23 NOTE — Telephone Encounter (Signed)
Referral has been made. Letter up front for pick up and patient aware.

## 2015-08-23 NOTE — Telephone Encounter (Signed)
Yes, she needs re-referral to [facility] to consider heller myotomy for Achalasia  Please write a letter to whomever she needs stating that she has Achalasia.  Thanks

## 2015-09-03 ENCOUNTER — Telehealth: Payer: Self-pay | Admitting: Internal Medicine

## 2015-09-03 NOTE — Telephone Encounter (Signed)
Pt wanted a letter stating that she has trouble swallowing, eating and talking due to achalasia and that she needed to be out of work until April after her surgery. Discussed with pt that she needs to letter to come from the surgeons office not GI as she has not been seen since Dr. Juanda Chance retired. Pt verbalized understanding.

## 2015-09-15 ENCOUNTER — Emergency Department (HOSPITAL_COMMUNITY): Payer: Medicaid Other

## 2015-09-15 ENCOUNTER — Emergency Department (HOSPITAL_COMMUNITY)
Admission: EM | Admit: 2015-09-15 | Discharge: 2015-09-15 | Disposition: A | Discharge status: Home / Self Care | Payer: Medicaid Other | Attending: Emergency Medicine | Admitting: Emergency Medicine

## 2015-09-15 ENCOUNTER — Encounter (HOSPITAL_COMMUNITY): Payer: Self-pay | Admitting: *Deleted

## 2015-09-15 ENCOUNTER — Telehealth: Payer: Self-pay | Admitting: Surgery

## 2015-09-15 DIAGNOSIS — S00511A Abrasion of lip, initial encounter: Secondary | ICD-10-CM | POA: Diagnosis not present

## 2015-09-15 DIAGNOSIS — T148XXA Other injury of unspecified body region, initial encounter: Secondary | ICD-10-CM

## 2015-09-15 DIAGNOSIS — J45909 Unspecified asthma, uncomplicated: Secondary | ICD-10-CM | POA: Diagnosis not present

## 2015-09-15 DIAGNOSIS — Y998 Other external cause status: Secondary | ICD-10-CM | POA: Insufficient documentation

## 2015-09-15 DIAGNOSIS — S51851A Open bite of right forearm, initial encounter: Secondary | ICD-10-CM | POA: Diagnosis not present

## 2015-09-15 DIAGNOSIS — Y9389 Activity, other specified: Secondary | ICD-10-CM | POA: Insufficient documentation

## 2015-09-15 DIAGNOSIS — S1195XA Open bite of unspecified part of neck, initial encounter: Secondary | ICD-10-CM | POA: Insufficient documentation

## 2015-09-15 DIAGNOSIS — S0990XA Unspecified injury of head, initial encounter: Secondary | ICD-10-CM

## 2015-09-15 DIAGNOSIS — Y9289 Other specified places as the place of occurrence of the external cause: Secondary | ICD-10-CM | POA: Diagnosis not present

## 2015-09-15 DIAGNOSIS — E669 Obesity, unspecified: Secondary | ICD-10-CM | POA: Diagnosis not present

## 2015-09-15 DIAGNOSIS — Z8619 Personal history of other infectious and parasitic diseases: Secondary | ICD-10-CM | POA: Diagnosis not present

## 2015-09-15 DIAGNOSIS — S0081XA Abrasion of other part of head, initial encounter: Secondary | ICD-10-CM | POA: Insufficient documentation

## 2015-09-15 DIAGNOSIS — F17210 Nicotine dependence, cigarettes, uncomplicated: Secondary | ICD-10-CM | POA: Diagnosis not present

## 2015-09-15 DIAGNOSIS — S0031XA Abrasion of nose, initial encounter: Secondary | ICD-10-CM | POA: Diagnosis not present

## 2015-09-15 DIAGNOSIS — Z23 Encounter for immunization: Secondary | ICD-10-CM | POA: Diagnosis not present

## 2015-09-15 MED ORDER — AMOXICILLIN-POT CLAVULANATE 400-57 MG/5ML PO SUSR
875.0000 mg | Freq: Two times a day (BID) | ORAL | Status: DC
Start: 2015-09-15 — End: 2015-11-05

## 2015-09-15 MED ORDER — OXYCODONE-ACETAMINOPHEN 5-325 MG PO TABS
1.0000 | ORAL_TABLET | Freq: Once | ORAL | Status: AC
Start: 2015-09-15 — End: 2015-09-15
  Administered 2015-09-15: 1 via ORAL

## 2015-09-15 MED ORDER — TETANUS-DIPHTH-ACELL PERTUSSIS 5-2.5-18.5 LF-MCG/0.5 IM SUSP
0.5000 mL | Freq: Once | INTRAMUSCULAR | Status: AC
  Administered 2015-09-15: 0.5 mL via INTRAMUSCULAR
  Filled 2015-09-15: qty 0.5

## 2015-09-15 MED ORDER — AMOXICILLIN-POT CLAVULANATE 400-57 MG/5ML PO SUSR: 875.0000 mg | Freq: Two times a day (BID) | ORAL | Status: DC

## 2015-09-15 MED ORDER — HYDROCODONE-ACETAMINOPHEN 7.5-325 MG/15ML PO SOLN: 15.0000 mL | Freq: Three times a day (TID) | ORAL | Status: DC | PRN

## 2015-09-15 MED ORDER — OXYCODONE-ACETAMINOPHEN 5-325 MG PO TABS
ORAL_TABLET | ORAL | Status: AC
  Filled 2015-09-15: qty 1

## 2015-09-15 MED ORDER — HYDROCODONE-ACETAMINOPHEN 7.5-325 MG/15ML PO SOLN
10.0000 mL | Freq: Once | ORAL | Status: AC
  Administered 2015-09-15: 10 mL via ORAL
  Filled 2015-09-15: qty 15

## 2015-09-15 MED ORDER — AMOXICILLIN-POT CLAVULANATE 400-57 MG/5ML PO SUSR
875.0000 mg | Freq: Once | ORAL | Status: DC
  Filled 2015-09-15: qty 10.9

## 2015-09-15 NOTE — Telephone Encounter (Signed)
ED CM received call from patient distressed over not being able to afford her prescription. Patient states, she does not have health insurance. Patient states that she has been approved for Medicaid but has not received the card as of yet. She said she dropped of the prescription at [facility] [address]. Permission was given by patient for this CM to contact [facility] on her behalf. Contacted [facility] with Medicaid information, prescription were accepted for coverage under Medicaid. Updated patient no further ED CM needs identified.

## 2015-09-15 NOTE — ED Notes (Signed)
Spoke with pharmacy again, they are to send the augmentin when the tube system opens up.
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
## 2015-09-15 NOTE — ED Notes (Signed)
Patient able to ambulate independently  
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
## 2015-09-15 NOTE — ED Notes (Signed)
Pt given percocet at triage, a few minutes later pt vomited the pill up. States she has problems with her esophagus and is scheduled for surgery to repair it and pt unable to swallow pills. States the vomiting is due to her esophagus and not related to her injury or pain. Is requesting liquid pain meds.

## 2015-09-15 NOTE — ED Notes (Signed)
Pt in a hurry to be discharged.  Refused additional vitals.  States she needs to pick her child up.  Patient did not want to wait for Amoxicillin.  Prescription provided and pt encouraged to pick up soon and begin this evening.  Pt verbalized understanding.
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
## 2015-09-15 NOTE — Discharge Instructions (Signed)
Take the prescribed medication as directed.  May wish to apply ice to face to help with swelling. Follow-up with your primary care physician. Return to the ED for new or worsening symptoms.

## 2015-09-20 ENCOUNTER — Ambulatory Visit: Payer: Self-pay | Admitting: General Surgery

## 2015-09-20 ENCOUNTER — Telehealth: Payer: Self-pay

## 2015-09-20 NOTE — Telephone Encounter (Signed)
-----   Message from Rachael Feeaniel P Jacobs, MD sent at 09/20/2015 11:50 AM EST ----- Tawanna Coolerodd, I'm sure I can get her in this week for another Botox. Thanks, we'll get in touch with her.  Pahola Dimmitt, [facility], botox for achalasia.  Thanks   ----- Message -----    From: Avel Peaceodd Rosenbower, MD    Sent: 09/20/2015  11:30 AM      To: Rachael Feeaniel P Jacobs, MD  Jesusita Okaan,  I saw her today for consideration of a Heller myotomy for her achalasia.  Unfortunately, given the complexity of the procedure, it usually takes at least 3-4 weeks to get this on the OR schedule.  She is severely symptomatic now and likely will not make it this far.  She is on Procardia which is not helping much.  Would you be able to do one more Botox injection on her to get her over this severe, acute phase and then I would schedule her surgery 2 months after that.  Last injection gave her 6 months of relief.  Not an ideal situation, but given the time needed to schedule this complex operation in order to have the best chance for optimal results, I think this is the best option.  Thx,  TR

## 2015-09-20 NOTE — H&P (Signed)
Theresa [name] 09/20/2015 10:24 AM Location: [facility] Patient #: [id number] DOB: Jun 03, 1991 Single / Language: [place] / Race: Black or African American Female  History of Present Illness Adolph Pollack MD; 09/20/2015 5:39 PM) The patient is a 25 year old female.   Note:She is referred by Dr. Wendall Papa because of achalasia. Records have been reviewed. Upper GI was reviewed. She was diagnosed with this back in 2014. Since that time, she is undergone dilatation with a savory dilator and 2 Botox injections. One Botox injection lasted 12 months, the other lasted 6 months. Recently, she is becoming progressively symptomatic where now she is having trouble swallowing thin liquids. When she was first diagnosed with achalasia, was recommended she have the Heller myotomy but she did not want to have this done. She states she's had an esophageal manometry in the past and describes it very vividly. However, I am unable to find the results of that. Upper GI findings demonstrated a dilated esophagus with bird beak narrowing. This was done in January 2015. Most recent upper endoscopy with Botox injection was done September 2016. She is lost about 10-15 pounds recently. She is now interested in having the esophageal myotomy.  Other Problems Fay Records, CMA; 09/20/2015 10:24 AM) Asthma Gastroesophageal Reflux Disease Hemorrhoids  Past Surgical History Fay Records, CMA; 09/20/2015 10:24 AM) Cesarean Section - Multiple Oral Surgery  Diagnostic Studies History Fay Records, CMA; 09/20/2015 10:24 AM) Colonoscopy never Mammogram never Pap Smear 1-5 years ago  Allergies Fay Records, CMA; 09/20/2015 10:24 AM) No Known Drug Allergies 09/20/2015  Medication History Fay Records, CMA; 09/20/2015 10:24 AM) No Current Medications Medications Reconciled  Social History Fay Records, CMA; 09/20/2015 10:24 AM) Alcohol use Occasional alcohol use. Caffeine use Coffee. No drug  use Tobacco use Current some day smoker.  Family History Fay Records, [place]; 09/20/2015 10:24 AM) First Degree Relatives No pertinent family history  Pregnancy / Birth History Fay Records, [place]; 09/20/2015 10:24 AM) Age at menarche 11 years. Contraceptive History Intrauterine device. Gravida 3 Irregular periods Maternal age 84-20 Para 2 Regular periods     Review of Systems Fay Records CMA; 09/20/2015 10:24 AM) General Present- Appetite Loss and Weight Loss. Not Present- Chills, Fatigue, Fever, Night Sweats and Weight Gain. Skin Not Present- Change in Wart/Mole, Dryness, Hives, Jaundice, New Lesions, Non-Healing Wounds, Rash and Ulcer. HEENT Not Present- Earache, Hearing Loss, Hoarseness, Nose Bleed, Oral Ulcers, Ringing in the Ears, Seasonal Allergies, Sinus Pain, Sore Throat, Visual Disturbances, Wears glasses/contact lenses and Yellow Eyes. Respiratory Present- Chronic Cough. Not Present- Bloody sputum, Difficulty Breathing, Snoring and Wheezing. Breast Not Present- Breast Mass, Breast Pain, Nipple Discharge and Skin Changes. Cardiovascular Present- Chest Pain. Not Present- Difficulty Breathing Lying Down, Leg Cramps, Palpitations, Rapid Heart Rate, Shortness of Breath and Swelling of Extremities. Gastrointestinal Present- Indigestion, Nausea and Vomiting. Not Present- Abdominal Pain, Bloating, Bloody Stool, Change in Bowel Habits, Chronic diarrhea, Constipation, Difficulty Swallowing, Excessive gas, Gets full quickly at meals, Hemorrhoids and Rectal Pain. Female Genitourinary Not Present- Frequency, Nocturia, Painful Urination, Pelvic Pain and Urgency. Musculoskeletal Not Present- Back Pain, Joint Pain, Joint Stiffness, Muscle Pain, Muscle Weakness and Swelling of Extremities. Neurological Not Present- Decreased Memory, Fainting, Headaches, Numbness, Seizures, Tingling, Tremor, Trouble walking and Weakness. Psychiatric Not Present- Anxiety, Bipolar, Change in Sleep Pattern,  Depression, Fearful and Frequent crying. Endocrine Not Present- Cold Intolerance, Excessive Hunger, Hair Changes, Heat Intolerance, Hot flashes and New Diabetes. Hematology Not Present- Easy Bruising, Excessive bleeding, Gland problems, HIV and Persistent Infections.  Vitals Fay Records(Ashley Beck CMA; 09/20/2015 10:24 AM) 09/20/2015 10:24 AM Weight: 174 lb Height: 64in Body Surface Area: 1.84 m Body Mass Index: 29.87 kg/m  Temp.: 98.28F(Temporal)  BP: 128/74 (Sitting, Left Arm, Standard)      Physical Exam Adolph Pollack(Helios Kohlmann J. Selmer Adduci MD; 09/20/2015 5:41 PM)  The physical exam findings are as follows: Note:General: Overweight female in NAD. Pleasant and cooperative.  HEENT: [place]/AT, no facial masses  EYES: no icterus  CV: RRR, no murmur, no JVD.  CHEST: Breath sounds equal and clear. Respirations nonlabored.  ABDOMEN: Soft, nontender, nondistended, no masses, no organomegaly, no hernias.  MUSCULOSKELETAL: no edema, no venous stasis changes  LYMPHATIC: No palpable cervical, supraclavicular, adenopathy.  SKIN: No suspicious rashes.  NEUROLOGIC: Alert and oriented, answers questions appropriately, normal gait and station.  PSYCHIATRIC: Normal mood, affect , and behavior.    Assessment & Plan Adolph Pollack(Elinore Shults J. Amarii Bordas MD; 09/20/2015 5:45 PM)  ACHALASIA (K22.0) Impression: Initial diagnosis in 2014. She said one dilatation and tube Botox treatments. She now has recurrent symptoms 6 months after the last Botox treatment. Her recurrent symptoms have progressed fairly rapidly. She's having trouble with thin liquids at this time. She is lost 10-15 pounds recently. [facility] We discussed that the overall goal of the operation is to make swallowing better, it will not definitively cure the achalasia and there is a chance of recurrence of her symptoms.  Plan: Laparoscopic Heller myotomy with Dor fundoplication. It will take at least 3-4 weeks to schedule the surgery,  given my current schedule. Thus, she will probably need a Botox injection to tide her over symptomatically. We will wait 6 or 8 weeks after that is done before doing the surgery. I have contacted Dr. Christella HartiganJacobs and he says he should be able to get to her hopefully sometime this week. Given that she's had multiple treatments before, it does make the risk of mucosal perforation slightly higher and we discussed this. I also want to get a hold of her manometry results and we will work on this.  The procedure and risks of the operation were explained. Risks include but are not limited to bleeding, infection, wound healing problems, anesthesia, esophageal perforation, injury to the stomach/colon/spleen/liver, need for reoperation failure of operation to improve the symptoms, reflux disease, dysphagia to some foods. We talked about importance of eating certain types of foods and certain behavioral changes with respect to diet.  Avel Peaceodd Merville Hijazi, MD

## 2015-09-21 ENCOUNTER — Other Ambulatory Visit: Payer: Self-pay

## 2015-09-21 DIAGNOSIS — K220 Achalasia of cardia: Secondary | ICD-10-CM

## 2015-09-21 NOTE — Telephone Encounter (Signed)
Left message on machine to call back  

## 2015-09-22 NOTE — Telephone Encounter (Signed)
Left message on machine to call back on home number and emergency contact number

## 2015-09-22 NOTE — Telephone Encounter (Signed)
[facility] with [name] and he found a mano from 2014, he will fax a copy to my attention.

## 2015-09-22 NOTE — Telephone Encounter (Signed)
Bernie with [facility] notified and a copy faxed to her office as well

## 2015-09-22 NOTE — Telephone Encounter (Signed)
Cyndra NumbersBernie wanted to see if we had a Esophageal mano from 2014.  I have searched and do not see that report.

## 2015-09-22 NOTE — Telephone Encounter (Signed)
Bernie from Dr. Maris Bergerosenbower's office called regarding this patient. [phone].

## 2015-09-23 ENCOUNTER — Ambulatory Visit (HOSPITAL_COMMUNITY): Admission: RE | Admit: 2015-09-23 | Payer: Medicaid Other | Source: Ambulatory Visit | Admitting: Gastroenterology

## 2015-09-23 ENCOUNTER — Encounter (HOSPITAL_COMMUNITY): Admission: RE | Payer: Self-pay | Source: Ambulatory Visit

## 2015-09-23 ENCOUNTER — Telehealth: Payer: Self-pay | Admitting: Gastroenterology

## 2015-09-23 ENCOUNTER — Ambulatory Visit: Payer: Self-pay | Admitting: General Surgery

## 2015-09-23 SURGERY — ESOPHAGOGASTRODUODENOSCOPY (EGD) WITH PROPOFOL
Anesthesia: Monitor Anesthesia Care

## 2015-09-23 NOTE — Telephone Encounter (Signed)
The pt called and states she no longer wishes to have the EGD with Botox, she is going to have the heller myotomy and skip the EGD.  She is concerned that she will not have insurance if she has the botox injection and waits to have the surgery.  She has called [facility] and made them aware and they are setting up the surgery.  I have [facility] and cx today's EGD.

## 2015-09-23 NOTE — Telephone Encounter (Signed)
Todd, She is not interested in EGD with botox, see below.  Let me know if you hear that she has changed her mind and I'm happy to reschedule. Thanks

## 2015-10-14 ENCOUNTER — Other Ambulatory Visit: Payer: Self-pay | Admitting: Obstetrics and Gynecology

## 2015-10-14 DIAGNOSIS — N6315 Unspecified lump in the right breast, overlapping quadrants: Secondary | ICD-10-CM

## 2015-10-14 DIAGNOSIS — N6310 Unspecified lump in the right breast, unspecified quadrant: Principal | ICD-10-CM

## 2015-10-15 ENCOUNTER — Other Ambulatory Visit: Payer: BLUE CROSS/BLUE SHIELD

## 2015-10-15 ENCOUNTER — Ambulatory Visit
Admission: RE | Admit: 2015-10-15 | Discharge: 2015-10-15 | Disposition: A | Discharge status: Home / Self Care | Payer: Medicaid Other | Source: Ambulatory Visit | Attending: Obstetrics and Gynecology | Admitting: Obstetrics and Gynecology

## 2015-10-15 ENCOUNTER — Telehealth: Payer: Self-pay | Admitting: Gastroenterology

## 2015-10-15 ENCOUNTER — Telehealth: Payer: Self-pay

## 2015-10-15 DIAGNOSIS — N6310 Unspecified lump in the right breast, unspecified quadrant: Principal | ICD-10-CM

## 2015-10-15 DIAGNOSIS — N6315 Unspecified lump in the right breast, overlapping quadrants: Secondary | ICD-10-CM

## 2015-10-15 MED ORDER — NIFEDIPINE 10 MG PO CAPS: 10.0000 mg | ORAL_CAPSULE | Freq: Three times a day (TID) | ORAL | Status: DC

## 2015-10-15 NOTE — Telephone Encounter (Signed)
First, I don't see that she is even on procardia.  If she is, it is not a medicine I will prescribe for her. She should talk with her pcp about it.

## 2015-10-15 NOTE — Telephone Encounter (Signed)
The patient has been notified of this information and all questions answered.

## 2015-10-15 NOTE — Telephone Encounter (Signed)
Dr Christella HartiganJacobs the pt is asking for procardia refill, she was a pt of Dr Juanda ChanceBrodie and the pt states Dr Juanda ChanceBrodie filled it for her.  Please advise

## 2015-10-15 NOTE — Telephone Encounter (Signed)
OK, I see now back in her records that Dr. Juanda ChanceBrodie had given her procardia.  I'll call in refills for this now 10mg  SL before meals.  90 pills, with one refill.  Can you please let her know that this has been called in.  I hope she can also recall that we scheduled EGD with botox injection 3 weeks ago for her to help with her achalasia until her surgery, however she cancelled the appointment.

## 2015-10-15 NOTE — Telephone Encounter (Signed)
Dr Christella HartiganJacobs the pt called very angry that the procardia is not being refilled.  She states that Dr Juanda ChanceBrodie and Gunnar FusiPaula have prescribed this in the past.  She was screaming and yelling at me on the phone and [facility] had to hang up on her because she was screaming so loud.  She states she can not eat, drink or swallow anything and she is breast feeding but unable to do so because she is not eating or drinking.  She insisted that Dr Christella HartiganJacobs call her because he does not understand that she needs the medication until her upcoming procedure for achalasia.

## 2015-10-15 NOTE — Telephone Encounter (Signed)
Toniann FailWendy at [facility] was notified of Dr Christella HartiganJacobs recommendations.

## 2015-10-18 NOTE — Telephone Encounter (Signed)
Spoke with pt and she is aware.

## 2015-10-20 ENCOUNTER — Other Ambulatory Visit: Payer: BLUE CROSS/BLUE SHIELD

## 2015-10-25 NOTE — Patient Instructions (Signed)
Theresa [name]  10/25/2015   Your procedure is scheduled on: 11/01/2015   [facility]  Entrance [place]  elevators to 3rd floor to  Short Stay Center at   0530 AM.  Call this number if you have problems the morning of surgery [phone]   Remember: ONLY 1 PERSON MAY GO WITH YOU TO SHORT STAY TO GET  READY MORNING OF YOUR SURGERY.  Do not eat food or drink liquids :After Midnight.     Take these medicines the morning of surgery with A SIP OF WATER: Procardia                                 You may not have any metal on your body including hair pins and              piercings  Do not wear jewelry, make-up, lotions, powders or perfumes, deodorant             Do not wear nail polish.  Do not shave  48 hours prior to surgery.               Do not bring valuables to the hospital.  IS NOT             RESPONSIBLE   FOR VALUABLES.  Contacts, dentures or bridgework may not be worn into surgery.  Leave suitcase in the car. After surgery it may be brought to your room.     Marland Kitchen.    Special Instructions: coughing and deep breathing exercises, leg exercises               Please read over the following fact sheets you were given: _____________________________________________________________________             [facility] - Preparing for [facility] Before surgery, you can play an important role.  Because skin is not sterile, your skin needs to be as free of germs as possible.  You can reduce the number of germs on your skin by washing with CHG (chlorahexidine gluconate) soap before surgery.  CHG is an antiseptic cleaner which kills germs and bonds with the skin to continue killing germs even after washing. Please DO NOT use if you have an allergy to CHG or antibacterial soaps.  If your skin becomes reddened/irritated stop using the CHG and inform your nurse when you arrive at Short Stay. Do not shave (including legs and underarms) for at least 48  hours prior to the first CHG shower.  You may shave your face/neck. Please follow these instructions carefully:  1.  Shower with CHG Soap the night before surgery and the  morning of Surgery.  2.  If you choose to wash your hair, wash your hair first as usual with your  normal  shampoo.  3.  After you shampoo, rinse your hair and body thoroughly to remove the  shampoo.                           4.  Use CHG as you would any other liquid soap.  You can apply chg directly  to the skin and wash                       Gently with a scrungie  or clean washcloth.  5.  Apply the CHG Soap to your body ONLY FROM THE NECK DOWN.   Do not use on face/ open                           Wound or open sores. Avoid contact with eyes, ears mouth and genitals (private parts).                       Wash face,  Genitals (private parts) with your normal soap.             6.  Wash thoroughly, paying special attention to the area where your surgery  will be performed.  7.  Thoroughly rinse your body with warm water from the neck down.  8.  DO NOT shower/wash with your normal soap after using and rinsing off  the CHG Soap.                9.  Pat yourself dry with a clean towel.            10.  Wear clean pajamas.            11.  Place clean sheets on your bed the night of your first shower and do not  sleep with pets. Day of Surgery : Do not apply any lotions/deodorants the morning of surgery.  Please wear clean clothes to the hospital/surgery center.  FAILURE TO FOLLOW THESE INSTRUCTIONS MAY RESULT IN THE CANCELLATION OF YOUR SURGERY PATIENT SIGNATURE_________________________________  NURSE SIGNATURE__________________________________  ________________________________________________________________________  WHAT IS A BLOOD TRANSFUSION? Blood Transfusion Information  A transfusion is the replacement of blood or some of its parts. Blood is made up of multiple cells which provide different functions.  Red blood cells  carry oxygen and are used for blood loss replacement.  White blood cells fight against infection.  Platelets control bleeding.  Plasma helps clot blood.  Other blood products are available for specialized needs, such as hemophilia or other clotting disorders. BEFORE THE TRANSFUSION  Who gives blood for transfusions?   Healthy volunteers who are fully evaluated to make sure their blood is safe. This is blood bank blood. Transfusion therapy is the safest it has ever been in the practice of medicine. Before blood is taken from a donor, a complete history is taken to make sure that person has no history of diseases nor engages in risky social behavior (examples are intravenous drug use or sexual activity with multiple partners). The donor's travel history is screened to minimize risk of transmitting infections, such as malaria. The donated blood is tested for signs of infectious diseases, such as HIV and hepatitis. The blood is then tested to be sure it is compatible with you in order to minimize the chance of a transfusion reaction. If you or a relative donates blood, this is often done in anticipation of surgery and is not appropriate for emergency situations. It takes many days to process the donated blood. RISKS AND COMPLICATIONS Although transfusion therapy is very safe and saves many lives, the main dangers of transfusion include:   Getting an infectious disease.  Developing a transfusion reaction. This is an allergic reaction to something in the blood you were given. Every precaution is taken to prevent this. The decision to have a blood transfusion has been considered carefully by your caregiver before blood is given. Blood is not given unless the benefits outweigh the risks. AFTER THE  TRANSFUSION  Right after receiving a blood transfusion, you will usually feel much better and more energetic. This is especially true if your red blood cells have gotten low (anemic). The transfusion raises  the level of the red blood cells which carry oxygen, and this usually causes an energy increase.  The nurse administering the transfusion will monitor you carefully for complications. HOME CARE INSTRUCTIONS  No special instructions are needed after a transfusion. You may find your energy is better. Speak with your caregiver about any limitations on activity for underlying diseases you may have. SEEK MEDICAL CARE IF:   Your condition is not improving after your transfusion.  You develop redness or irritation at the intravenous (IV) site. SEEK IMMEDIATE MEDICAL CARE IF:  Any of the following symptoms occur over the next 12 hours:  Shaking chills.  You have a temperature by mouth above 102 F (38.9 C), not controlled by medicine.  Chest, back, or muscle pain.  People around you feel you are not acting correctly or are confused.  Shortness of breath or difficulty breathing.  Dizziness and fainting.  You get a rash or develop hives.  You have a decrease in urine output.  Your urine turns a dark color or changes to pink, red, or brown. Any of the following symptoms occur over the next 10 days:  You have a temperature by mouth above 102 F (38.9 C), not controlled by medicine.  Shortness of breath.  Weakness after normal activity.  The white part of the eye turns yellow (jaundice).  You have a decrease in the amount of urine or are urinating less often.  Your urine turns a dark color or changes to pink, red, or brown. Document Released: 06/30/2000 Document Revised: 09/25/2011 Document Reviewed: 02/17/2008 [facility] Patient Information 2014 [place], [place].  _______________________________________________________________________

## 2015-10-26 ENCOUNTER — Encounter (HOSPITAL_COMMUNITY)
Admission: RE | Admit: 2015-10-26 | Discharge: 2015-10-26 | Disposition: A | Discharge status: Home / Self Care | Payer: Medicaid Other | Source: Ambulatory Visit | Attending: General Surgery | Admitting: General Surgery

## 2015-10-26 ENCOUNTER — Ambulatory Visit (HOSPITAL_COMMUNITY)
Admission: RE | Admit: 2015-10-26 | Discharge: 2015-10-26 | Disposition: A | Discharge status: Home / Self Care | Payer: Medicaid Other | Source: Ambulatory Visit | Attending: General Surgery | Admitting: General Surgery

## 2015-10-26 ENCOUNTER — Encounter (HOSPITAL_COMMUNITY): Payer: Self-pay

## 2015-10-26 DIAGNOSIS — K220 Achalasia of cardia: Secondary | ICD-10-CM

## 2015-10-26 HISTORY — DX: Major depressive disorder, single episode, unspecified: F32.9

## 2015-10-26 HISTORY — DX: Depression, unspecified: F32.A

## 2015-10-26 HISTORY — DX: Gastro-esophageal reflux disease without esophagitis: K21.9

## 2015-10-26 HISTORY — DX: Anxiety disorder, unspecified: F41.9

## 2015-10-26 LAB — CBC WITH DIFFERENTIAL/PLATELET
Basophils Absolute: 0 10*3/uL (ref 0.0–0.1)
Basophils Relative: 1 %
EOS PCT: 7 %
Eosinophils Absolute: 0.3 10*3/uL (ref 0.0–0.7)
HCT: 37.8 % (ref 36.0–46.0)
Hemoglobin: 12.8 g/dL (ref 12.0–15.0)
LYMPHS ABS: 1.8 10*3/uL (ref 0.7–4.0)
LYMPHS PCT: 35 %
MCH: 28.6 pg (ref 26.0–34.0)
MCHC: 33.9 g/dL (ref 30.0–36.0)
MCV: 84.4 fL (ref 78.0–100.0)
MONO ABS: 0.4 10*3/uL (ref 0.1–1.0)
MONOS PCT: 7 %
Neutro Abs: 2.5 10*3/uL (ref 1.7–7.7)
Neutrophils Relative %: 50 %
PLATELETS: 274 10*3/uL (ref 150–400)
RBC: 4.48 MIL/uL (ref 3.87–5.11)
RDW: 13.1 % (ref 11.5–15.5)
WBC: 5 10*3/uL (ref 4.0–10.5)

## 2015-10-26 LAB — HCG, SERUM, QUALITATIVE: PREG SERUM: NEGATIVE

## 2015-10-26 LAB — COMPREHENSIVE METABOLIC PANEL
ALT: 12 U/L — AB (ref 14–54)
AST: 16 U/L (ref 15–41)
Albumin: 4.3 g/dL (ref 3.5–5.0)
Alkaline Phosphatase: 71 U/L (ref 38–126)
Anion gap: 7 (ref 5–15)
BUN: 9 mg/dL (ref 6–20)
CHLORIDE: 110 mmol/L (ref 101–111)
CO2: 24 mmol/L (ref 22–32)
CREATININE: 0.73 mg/dL (ref 0.44–1.00)
Calcium: 9.4 mg/dL (ref 8.9–10.3)
Glucose, Bld: 94 mg/dL (ref 65–99)
POTASSIUM: 3.7 mmol/L (ref 3.5–5.1)
Sodium: 141 mmol/L (ref 135–145)
Total Bilirubin: 0.5 mg/dL (ref 0.3–1.2)
Total Protein: 7.7 g/dL (ref 6.5–8.1)

## 2015-10-31 NOTE — Anesthesia Preprocedure Evaluation (Signed)
Anesthesia Evaluation  Patient identified by MRN, date of birth, ID band Patient awake    Reviewed: Allergy & Precautions, NPO status , Patient's Chart, lab work & pertinent test results  Airway Mallampati: I   Neck ROM: Full    Dental  (+) Teeth Intact   Pulmonary asthma , former smoker,    breath sounds clear to auscultation       Cardiovascular  Rhythm:Regular Rate:Normal     Neuro/Psych    GI/Hepatic Neg liver ROS, GERD  ,Esophageal achalasia   Endo/Other  negative endocrine ROS  Renal/GU negative Renal ROS     Musculoskeletal   Abdominal   Peds  Hematology negative hematology ROS (+)   Anesthesia Other Findings   Reproductive/Obstetrics                             Anesthesia Physical Anesthesia Plan  ASA: II  Anesthesia Plan: General   Post-op Pain Management:    Induction: Intravenous  Airway Management Planned: Oral ETT  Additional Equipment:   Intra-op Plan:   Post-operative Plan:   Informed Consent: I have reviewed the patients History and Physical, chart, labs and discussed the procedure including the risks, benefits and alternatives for the proposed anesthesia with the patient or authorized representative who has indicated his/her understanding and acceptance.   Dental advisory given  Plan Discussed with: CRNA and Surgeon  Anesthesia Plan Comments:         Anesthesia Quick Evaluation

## 2015-11-01 ENCOUNTER — Ambulatory Visit (HOSPITAL_COMMUNITY): Payer: Medicaid Other | Admitting: Anesthesiology

## 2015-11-01 ENCOUNTER — Encounter (HOSPITAL_COMMUNITY): Payer: Self-pay | Admitting: *Deleted

## 2015-11-01 ENCOUNTER — Encounter (HOSPITAL_COMMUNITY)
Admission: RE | Disposition: A | Discharge status: Home / Self Care | Payer: Self-pay | Source: Ambulatory Visit | Attending: General Surgery

## 2015-11-01 ENCOUNTER — Inpatient Hospital Stay (HOSPITAL_COMMUNITY)
Admission: RE | Admit: 2015-11-01 | Discharge: 2015-11-05 | DRG: 327 | Disposition: A | Discharge status: Home / Self Care | Payer: Medicaid Other | Source: Ambulatory Visit | Attending: General Surgery | Admitting: General Surgery

## 2015-11-01 DIAGNOSIS — Z87891 Personal history of nicotine dependence: Secondary | ICD-10-CM

## 2015-11-01 DIAGNOSIS — Z6830 Body mass index (BMI) 30.0-30.9, adult: Secondary | ICD-10-CM

## 2015-11-01 DIAGNOSIS — E669 Obesity, unspecified: Secondary | ICD-10-CM | POA: Diagnosis present

## 2015-11-01 DIAGNOSIS — Z79899 Other long term (current) drug therapy: Secondary | ICD-10-CM | POA: Diagnosis not present

## 2015-11-01 DIAGNOSIS — K220 Achalasia of cardia: Secondary | ICD-10-CM | POA: Diagnosis present

## 2015-11-01 DIAGNOSIS — K567 Ileus, unspecified: Secondary | ICD-10-CM | POA: Diagnosis not present

## 2015-11-01 HISTORY — PX: HELLER MYOTOMY: SHX5259

## 2015-11-01 HISTORY — PX: ESOPHAGOGASTRODUODENOSCOPY: SHX5428

## 2015-11-01 LAB — TYPE AND SCREEN
ABO/RH(D): AB POS
ANTIBODY SCREEN: NEGATIVE

## 2015-11-01 SURGERY — ESOPHAGOMYOTOMY, LAPAROSCOPIC, HELLER
Anesthesia: General

## 2015-11-01 MED ORDER — DEXAMETHASONE SODIUM PHOSPHATE 10 MG/ML IJ SOLN
INTRAMUSCULAR | Status: AC
  Filled 2015-11-01: qty 1

## 2015-11-01 MED ORDER — HYDROMORPHONE HCL 1 MG/ML IJ SOLN
INTRAMUSCULAR | Status: AC
  Filled 2015-11-01: qty 1

## 2015-11-01 MED ORDER — LACTATED RINGERS IV SOLN
INTRAVENOUS | Status: DC | PRN
  Administered 2015-11-01 (×4): via INTRAVENOUS

## 2015-11-01 MED ORDER — CLOTRIMAZOLE 1 % VA CREA
1.0000 | TOPICAL_CREAM | Freq: Every day | VAGINAL | Status: DC
  Administered 2015-11-01: 1 via VAGINAL
  Filled 2015-11-01: qty 45

## 2015-11-01 MED ORDER — ONDANSETRON 4 MG PO TBDP: 4.0000 mg | ORAL_TABLET | Freq: Four times a day (QID) | ORAL | Status: DC | PRN

## 2015-11-01 MED ORDER — ONDANSETRON HCL 4 MG/2ML IJ SOLN: 4.0000 mg | INTRAMUSCULAR | Status: DC | PRN

## 2015-11-01 MED ORDER — HYDROMORPHONE HCL 2 MG/ML IJ SOLN
INTRAMUSCULAR | Status: AC
  Filled 2015-11-01: qty 1

## 2015-11-01 MED ORDER — KCL IN DEXTROSE-NACL 20-5-0.9 MEQ/L-%-% IV SOLN
INTRAVENOUS | Status: DC
  Administered 2015-11-01 – 2015-11-04 (×5): via INTRAVENOUS
  Filled 2015-11-01 (×7): qty 1000

## 2015-11-01 MED ORDER — TISSEEL VH 10 ML EX KIT
PACK | CUTANEOUS | Status: AC
  Filled 2015-11-01: qty 1

## 2015-11-01 MED ORDER — PROPOFOL 10 MG/ML IV BOLUS
INTRAVENOUS | Status: AC
  Filled 2015-11-01: qty 20

## 2015-11-01 MED ORDER — SUCCINYLCHOLINE CHLORIDE 20 MG/ML IJ SOLN
INTRAMUSCULAR | Status: DC | PRN
  Administered 2015-11-01: 100 mg via INTRAVENOUS

## 2015-11-01 MED ORDER — SUGAMMADEX SODIUM 200 MG/2ML IV SOLN
INTRAVENOUS | Status: DC | PRN
  Administered 2015-11-01: 175 mg via INTRAVENOUS

## 2015-11-01 MED ORDER — ONDANSETRON HCL 4 MG/2ML IJ SOLN
INTRAMUSCULAR | Status: DC | PRN
  Administered 2015-11-01: 4 mg via INTRAVENOUS

## 2015-11-01 MED ORDER — MIDAZOLAM HCL 2 MG/2ML IJ SOLN
INTRAMUSCULAR | Status: AC
  Filled 2015-11-01: qty 2

## 2015-11-01 MED ORDER — DEXAMETHASONE SODIUM PHOSPHATE 10 MG/ML IJ SOLN
INTRAMUSCULAR | Status: DC | PRN
  Administered 2015-11-01: 10 mg via INTRAVENOUS

## 2015-11-01 MED ORDER — BUPIVACAINE-EPINEPHRINE 0.5% -1:200000 IJ SOLN
INTRAMUSCULAR | Status: DC | PRN
  Administered 2015-11-01: 9 mL

## 2015-11-01 MED ORDER — DEXTROSE 5 % IV SOLN
2.0000 g | INTRAVENOUS | Status: AC
  Administered 2015-11-01: 2 g via INTRAVENOUS
  Filled 2015-11-01: qty 20

## 2015-11-01 MED ORDER — MORPHINE SULFATE (PF) 2 MG/ML IV SOLN
2.0000 mg | INTRAVENOUS | Status: DC | PRN
  Administered 2015-11-01 (×2): 6 mg via INTRAVENOUS
  Administered 2015-11-01: 4 mg via INTRAVENOUS
  Administered 2015-11-02 – 2015-11-03 (×11): 6 mg via INTRAVENOUS
  Administered 2015-11-03: 2 mg via INTRAVENOUS
  Filled 2015-11-01 (×10): qty 3
  Filled 2015-11-01: qty 2
  Filled 2015-11-01 (×4): qty 3

## 2015-11-01 MED ORDER — NICOTINE 7 MG/24HR TD PT24
7.0000 mg | MEDICATED_PATCH | Freq: Every day | TRANSDERMAL | Status: DC
  Administered 2015-11-01: 7 mg via TRANSDERMAL
  Filled 2015-11-01 (×5): qty 1

## 2015-11-01 MED ORDER — ONDANSETRON HCL 4 MG/2ML IJ SOLN
4.0000 mg | INTRAMUSCULAR | Status: AC
  Administered 2015-11-01 – 2015-11-02 (×4): 4 mg via INTRAVENOUS
  Filled 2015-11-01 (×4): qty 2

## 2015-11-01 MED ORDER — ROCURONIUM BROMIDE 100 MG/10ML IV SOLN
INTRAVENOUS | Status: AC
  Filled 2015-11-01: qty 1

## 2015-11-01 MED ORDER — LACTATED RINGERS IV SOLN: INTRAVENOUS | Status: DC

## 2015-11-01 MED ORDER — CEFAZOLIN SODIUM-DEXTROSE 2-4 GM/100ML-% IV SOLN
2.0000 g | Freq: Three times a day (TID) | INTRAVENOUS | Status: AC
  Administered 2015-11-01: 2 g via INTRAVENOUS
  Filled 2015-11-01: qty 100

## 2015-11-01 MED ORDER — HYDROMORPHONE HCL 1 MG/ML IJ SOLN
0.2500 mg | INTRAMUSCULAR | Status: DC | PRN
  Administered 2015-11-01 (×4): 0.25 mg via INTRAVENOUS

## 2015-11-01 MED ORDER — PROCHLORPERAZINE EDISYLATE 5 MG/ML IJ SOLN: 10.0000 mg | INTRAMUSCULAR | Status: DC | PRN

## 2015-11-01 MED ORDER — CEFAZOLIN SODIUM-DEXTROSE 2-4 GM/100ML-% IV SOLN
INTRAVENOUS | Status: AC
  Filled 2015-11-01: qty 100

## 2015-11-01 MED ORDER — BUPIVACAINE-EPINEPHRINE (PF) 0.5% -1:200000 IJ SOLN
INTRAMUSCULAR | Status: AC
  Filled 2015-11-01: qty 30

## 2015-11-01 MED ORDER — LIDOCAINE HCL (CARDIAC) 20 MG/ML IV SOLN
INTRAVENOUS | Status: DC | PRN
  Administered 2015-11-01: 50 mg via INTRAVENOUS

## 2015-11-01 MED ORDER — LACTATED RINGERS IR SOLN
Status: DC | PRN
  Administered 2015-11-01: 1

## 2015-11-01 MED ORDER — FENTANYL CITRATE (PF) 250 MCG/5ML IJ SOLN
INTRAMUSCULAR | Status: AC
  Filled 2015-11-01: qty 5

## 2015-11-01 MED ORDER — HEMOSTATIC AGENTS (NO CHARGE) OPTIME
TOPICAL | Status: DC | PRN
  Administered 2015-11-01: 1 via TOPICAL

## 2015-11-01 MED ORDER — FENTANYL CITRATE (PF) 100 MCG/2ML IJ SOLN
INTRAMUSCULAR | Status: DC | PRN
  Administered 2015-11-01 (×2): 50 ug via INTRAVENOUS
  Administered 2015-11-01: 100 ug via INTRAVENOUS
  Administered 2015-11-01 (×6): 50 ug via INTRAVENOUS

## 2015-11-01 MED ORDER — 0.9 % SODIUM CHLORIDE (POUR BTL) OPTIME
TOPICAL | Status: DC | PRN
  Administered 2015-11-01: 1000 mL

## 2015-11-01 MED ORDER — PROPOFOL 10 MG/ML IV BOLUS
INTRAVENOUS | Status: DC | PRN
  Administered 2015-11-01: 200 mg via INTRAVENOUS

## 2015-11-01 MED ORDER — ONDANSETRON HCL 4 MG/2ML IJ SOLN: 4.0000 mg | Freq: Once | INTRAMUSCULAR | Status: DC

## 2015-11-01 MED ORDER — PANTOPRAZOLE SODIUM 40 MG IV SOLR
40.0000 mg | Freq: Every day | INTRAVENOUS | Status: DC
  Administered 2015-11-01: 40 mg via INTRAVENOUS
  Filled 2015-11-01 (×2): qty 40

## 2015-11-01 MED ORDER — MIDAZOLAM HCL 5 MG/5ML IJ SOLN
INTRAMUSCULAR | Status: DC | PRN
  Administered 2015-11-01: 2 mg via INTRAVENOUS

## 2015-11-01 MED ORDER — ROCURONIUM BROMIDE 100 MG/10ML IV SOLN
INTRAVENOUS | Status: DC | PRN
  Administered 2015-11-01: 5 mg via INTRAVENOUS
  Administered 2015-11-01: 20 mg via INTRAVENOUS
  Administered 2015-11-01: 10 mg via INTRAVENOUS
  Administered 2015-11-01: 45 mg via INTRAVENOUS
  Administered 2015-11-01: 10 mg via INTRAVENOUS

## 2015-11-01 MED ORDER — LIDOCAINE HCL (CARDIAC) 20 MG/ML IV SOLN
INTRAVENOUS | Status: AC
  Filled 2015-11-01: qty 5

## 2015-11-01 MED ORDER — ONDANSETRON HCL 4 MG/2ML IJ SOLN
INTRAMUSCULAR | Status: AC
  Filled 2015-11-01: qty 2

## 2015-11-01 MED ORDER — PHENOL 1.4 % MT LIQD
1.0000 | OROMUCOSAL | Status: DC | PRN
  Filled 2015-11-01: qty 177

## 2015-11-01 MED ORDER — SUGAMMADEX SODIUM 200 MG/2ML IV SOLN
INTRAVENOUS | Status: AC
  Filled 2015-11-01: qty 2

## 2015-11-01 MED ORDER — HYDROMORPHONE HCL 1 MG/ML IJ SOLN
INTRAMUSCULAR | Status: DC | PRN
  Administered 2015-11-01: 1 mg via INTRAVENOUS
  Administered 2015-11-01 (×2): 0.5 mg via INTRAVENOUS

## 2015-11-01 SURGICAL SUPPLY — 54 items
APL SKNCLS STERI-STRIP NONHPOA (GAUZE/BANDAGES/DRESSINGS) ×1
APL SRG 32X5 SNPLK LF DISP (MISCELLANEOUS) ×1
APPLIER CLIP 5 13 M/L LIGAMAX5 (MISCELLANEOUS)
APR CLP MED LRG 5 ANG JAW (MISCELLANEOUS)
BENZOIN TINCTURE PRP APPL 2/3 (GAUZE/BANDAGES/DRESSINGS) ×2 IMPLANT
CLIP APPLIE 5 13 M/L LIGAMAX5 (MISCELLANEOUS) IMPLANT
COVER SURGICAL LIGHT HANDLE (MISCELLANEOUS) ×2 IMPLANT
DECANTER SPIKE VIAL GLASS SM (MISCELLANEOUS) ×2 IMPLANT
DEVICE SUT QUICK LOAD TK 5 (STAPLE) ×10 IMPLANT
DEVICE SUT TI-KNOT TK 5X26 (MISCELLANEOUS) ×2 IMPLANT
DEVICE SUTURE ENDOST 10MM (ENDOMECHANICALS) ×2 IMPLANT
DISSECTOR BLUNT TIP ENDO 5MM (MISCELLANEOUS) ×2 IMPLANT
DRAIN CHANNEL 19F RND (DRAIN) IMPLANT
DRAIN PENROSE 18X1/2 LTX STRL (DRAIN) ×2 IMPLANT
DRAPE LAPAROSCOPIC ABDOMINAL (DRAPES) ×2 IMPLANT
DRSG TEGADERM 2-3/8X2-3/4 SM (GAUZE/BANDAGES/DRESSINGS) ×6 IMPLANT
DRSG TEGADERM 6X8 (GAUZE/BANDAGES/DRESSINGS) ×1 IMPLANT
ELECT REM PT RETURN 9FT ADLT (ELECTROSURGICAL) ×2
ELECTRODE REM PT RTRN 9FT ADLT (ELECTROSURGICAL) ×1 IMPLANT
EVACUATOR SILICONE 100CC (DRAIN) IMPLANT
FILTER SMOKE EVAC LAPAROSHD (FILTER) ×2 IMPLANT
GAUZE SPONGE 2X2 8PLY STRL LF (GAUZE/BANDAGES/DRESSINGS) IMPLANT
GLOVE ECLIPSE 8.0 STRL XLNG CF (GLOVE) ×2 IMPLANT
GLOVE INDICATOR 8.0 STRL GRN (GLOVE) ×2 IMPLANT
GOWN STRL REUS W/TWL XL LVL3 (GOWN DISPOSABLE) ×6 IMPLANT
HEMOSTAT SNOW SURGICEL 2X4 (HEMOSTASIS) ×1 IMPLANT
KIT BASIN OR (CUSTOM PROCEDURE TRAY) ×2 IMPLANT
PAD POSITIONING PINK XL (MISCELLANEOUS) IMPLANT
POSITIONER SURGICAL ARM (MISCELLANEOUS) IMPLANT
SCISSORS LAP 5X35 DISP (ENDOMECHANICALS) ×2 IMPLANT
SEALANT SURGICAL APPL DUAL CAN (MISCELLANEOUS) ×2 IMPLANT
SET IRRIG TUBING LAPAROSCOPIC (IRRIGATION / IRRIGATOR) ×2 IMPLANT
SHEARS HARMONIC ACE PLUS 36CM (ENDOMECHANICALS) ×2 IMPLANT
SLEEVE SURGEON STRL (DRAPES) ×1 IMPLANT
SLEEVE XCEL OPT CAN 5 100 (ENDOMECHANICALS) ×7 IMPLANT
SPONGE GAUZE 2X2 STER 10/PKG (GAUZE/BANDAGES/DRESSINGS) ×1
SUT ETHILON 2 0 PS N (SUTURE) IMPLANT
SUT MNCRL AB 4-0 PS2 18 (SUTURE) ×3 IMPLANT
SUT PDS AB 4-0 SH 27 (SUTURE) IMPLANT
SUT SURGIDAC NAB ES-9 0 48 120 (SUTURE) ×11 IMPLANT
TAPE CLOTH 4X10 WHT NS (GAUZE/BANDAGES/DRESSINGS) IMPLANT
TAPE STRIPS DRAPE STRL (GAUZE/BANDAGES/DRESSINGS) ×1 IMPLANT
TIP INNERVISION DETACH 40FR (MISCELLANEOUS) IMPLANT
TIP INNERVISION DETACH 50FR (MISCELLANEOUS) IMPLANT
TIP INNERVISION DETACH 56FR (MISCELLANEOUS) IMPLANT
TIPS INNERVISION DETACH 40FR (MISCELLANEOUS)
TOWEL OR 17X26 10 PK STRL BLUE (TOWEL DISPOSABLE) ×2 IMPLANT
TRAY FOLEY W/METER SILVER 14FR (SET/KITS/TRAYS/PACK) ×2 IMPLANT
TRAY FOLEY W/METER SILVER 16FR (SET/KITS/TRAYS/PACK) ×1 IMPLANT
TRAY LAPAROSCOPIC (CUSTOM PROCEDURE TRAY) ×2 IMPLANT
TROCAR BLADELESS OPT 5 100 (ENDOMECHANICALS) ×2 IMPLANT
TROCAR XCEL BLUNT TIP 100MML (ENDOMECHANICALS) IMPLANT
TROCAR XCEL NON-BLD 11X100MML (ENDOMECHANICALS) ×2 IMPLANT
TROCAR XCEL UNIV SLVE 11M 100M (ENDOMECHANICALS) ×2 IMPLANT

## 2015-11-01 NOTE — Anesthesia Procedure Notes (Signed)
Procedure Name: Intubation Date/Time: 11/01/2015 7:35 AM Performed by: Enriqueta ShutterWILLIFORD, Damien Batty D Pre-anesthesia Checklist: Patient identified, Emergency Drugs available, Suction available and Patient being monitored Patient Re-evaluated:Patient Re-evaluated prior to inductionOxygen Delivery Method: Circle System Utilized Preoxygenation: Pre-oxygenation with 100% oxygen Intubation Type: IV induction and Rapid sequence Laryngoscope Size: Mac and 3 Grade View: Grade II Tube type: Oral Tube size: 7.5 mm Number of attempts: 1 Airway Equipment and Method: Stylet and Oral airway Placement Confirmation: ETT inserted through vocal cords under direct vision,  positive ETCO2 and breath sounds checked- equal and bilateral Tube secured with: Tape Dental Injury: Teeth and Oropharynx as per pre-operative assessment

## 2015-11-01 NOTE — Op Note (Signed)
Operative Note  Theresa [name] female 25 y.o. 11/01/2015  PREOPERATIVE DX:  Achalasia  POSTOPERATIVE DX:  Same  PROCEDURE:   Laparoscopic Heller myotomy and Dor fundoplication. Upper endoscopy (Dr. Ezzard Standing)         Surgeon: Adolph Pollack   Assistants: Dr. Ovidio Kin  Anesthesia: General endotracheal anesthesia  Indications:   This is a 25 year old female diagnosed with achalasia in 2014. She's had multiple Botox injections and dilatations. It was recommended that she have the above procedure initially but she did not want to undergo the surgery. Her symptoms continued to progress, despite the other treatments, and now she presents for [facility] myotomy.    Procedure Detail:  She was brought to the operating room placed supine on the operating table and a general anesthetic was given. A Foley catheter was inserted. An oral gastric tube was inserted and some food particles evacuated. The abdominal wall was widely sterilely prepped and draped. A timeout was performed.  She was placed in reverse Trendelenburg position. A 5 mm incision was made in the left subcostal area. Using a 5 mm Optiview trocar and laparoscope, access was gained into the peritoneal cavity and pneumoperitoneum was created by insufflation of CO2 gas.  The area under the trocar was inspected and there is no evidence of organ injury or bleeding.  A 5 mm trocar was placed just to the left of the umbilicus. A 5 mm trocar was placed in the right upper quadrant. An 11 mm trocar was placed through an epigastric incision. A 5 mm trocar was placed in the lateral left upper quadrant.  A 5 mm incision was made in the subxiphoid area. A self retaining liver retractor was placed into the peritoneal cavity. The left lobe of the liver was retracted anteriorly exposing the gastroesophageal junction.  Thin gastrohepatic ligament was divided with the Harmonic scalpel up to the right crus. I then divided the anterior phrenoesophageal  ligament which was somewhat thickened and demonstrates chronic scarring. Identified the left crus. I divided short gastric vessels, using harmonic scalpel, on the fundus and up to the cardia to allow for mobilization of the stomach for the Dor fundoplication.  There is a small amount of bleeding from a small vessel close to the spleen. Surgicel was placed here and the bleeding stopped.  Identified the external anatomy of the gastroesophageal junction. Again a proximal to this, began dividing the longitudinal fibers using select electrocautery and a bulge in. There is a fairly dense scar at this level so I went 1 cm proximal were the myotomy was easier. I then identified underlying circular fibers and began dividing them with select electrocautery and a avulsion technique. I continued to divide both the longitudinal and circular fibers proximally for about 4-5 cm proximal to the area I started which was 1 cm proximal to the GE junction. I separated the muscle fibers of the knee myotomy was 180. I do not see any evidence of mucosal perforation.  I then began at the point of the dense scar 1 cm proximal to the GE junction and was not able to mobilize the muscle fibers well. I subsequently started below this on the GE junction began dividing the longitudinal and circular fibers on the stomach for a distance of at least 2 cm below the GE junction. I distracted the fibers to make sure I had 180 myotomy. During this allowed me to visualize the area where there was the most dense scar at the GE junction and I was able  to divide the longitudinal and circular fibers using the avulsion technique only. At this point the myotomy measured approximately 5-6 cm proximal to the GE junction and 2 cm distal to it.  Upper endoscopy was performed by Dr. Ezzard StandingNewman. Through particles were noted in the esophagus which were evacuated. The gastroesophageal junction was wide open and allowed for easy passage of the endoscope into the  stomach.  There was no evidence of mucosal perforation. The air and the stomach was evacuated and the endoscope was removed.  I then performed a Dor anterior fundoplication using size 0 nonabsorbable sutures. I used 3 sutures on each side of the fundoplication. I used one suture to anchor the stomach to the apex of the hiatus.  Then carefully inspected the area. There is no evidence of bleeding or organ injury. 4 quadrant inspection also demonstrated no evidence of bleeding or organ injury.  The liver retractor was removed. The pneumoperitoneum was released and all trocars were removed.  Skin incisions were closed with 4-0 Monocryl subcuticular stitches. Steri-Strips and sterile dressings were applied.  She tolerated the procedure well without any apparent complications. She was taken to the recovery room in satisfactory condition.         Complications:  * No complications entered in OR log *         Disposition: PACU - hemodynamically stable.         Condition: stable

## 2015-11-01 NOTE — Interval H&P Note (Signed)
History and Physical Interval Note:  11/01/2015 7:26 AM  Theresa [name]  has presented today for surgery, with the diagnosis of achalasia  The various methods of treatment have been discussed with the patient and family. After consideration of risks, benefits and other options for treatment, the patient has consented to  Procedure(s): LAPAROSCOPIC HELLER MYOTOMY AND DOR FUNDOPLICATION (N/A) ESOPHAGOGASTRODUODENOSCOPY (EGD) (N/A) as a surgical intervention .  The patient's history has been reviewed, patient examined, no change in status, stable for surgery.  I have reviewed the patient's chart and labs.  Questions were answered to the patient's satisfaction.     [name] JShela Commons

## 2015-11-01 NOTE — Transfer of Care (Signed)
Immediate Anesthesia Transfer of Care Note  Patient: Theresa [name]  Procedure(s) Performed: Procedure(s): LAPAROSCOPIC HELLER MYOTOMY AND DOR FUNDOPLICATION (N/A) ESOPHAGOGASTRODUODENOSCOPY (EGD) (N/A)  Patient Location: PACU  Anesthesia Type:General  Level of Consciousness: awake, alert  and oriented  Airway & Oxygen Therapy: Patient Spontanous Breathing and Patient connected to face mask oxygen  Post-op Assessment: Report given to RN and Post -op Vital signs reviewed and stable  Post vital signs: Reviewed and stable  Last Vitals:  Filed Vitals:   11/01/15 0558 11/01/15 1120  BP: 102/62   Pulse: 65   Temp: 36.8 C 36.7 C  Resp: 16     Complications: No apparent anesthesia complications

## 2015-11-01 NOTE — Anesthesia Postprocedure Evaluation (Signed)
Anesthesia Post Note  Patient: [name]  Procedure(s) Performed: Procedure(s) (LRB): LAPAROSCOPIC HELLER MYOTOMY AND DOR FUNDOPLICATION (N/A) ESOPHAGOGASTRODUODENOSCOPY (EGD) (N/A)  Patient location during evaluation: PACU Anesthesia Type: General Level of consciousness: sedated and awake Pain management: pain level controlled Vital Signs Assessment: post-procedure vital signs reviewed and stable Respiratory status: spontaneous breathing, nonlabored ventilation, respiratory function stable and patient connected to nasal cannula oxygen Cardiovascular status: blood pressure returned to baseline and stable Postop Assessment: no signs of nausea or vomiting Anesthetic complications: no    Last Vitals:  Filed Vitals:   11/01/15 1200 11/01/15 1215  BP: 120/77 129/71  Pulse: 71 64  Temp:  36.4 C  Resp: 9 17    Last Pain:  Filed Vitals:   11/01/15 1222  PainSc: Asleep                 Maykel Reitter,JAMES TERRILL

## 2015-11-01 NOTE — Op Note (Signed)
Name:  Theresa [name] MRN: [id number] Date of Surgery: 11/01/2015  Preop Diagnosis:  Achalasia  Postop Diagnosis:  Achalasia, Heller myotomy  Procedure:  Upper endoscopy  (Intraoperative)  Surgeon:  Ovidio Kinavid Ransome Helwig, M.D.  Anesthesia:  GET  Indications for procedure: Theresa [name] is a 25 y.o. female whose primary care physician is PROVIDER NOT IN SYSTEM and has completed a Heller myotomy today by Dr. Abbey Chattersosenbower.  I am doing an intraoperative upper endoscopy to evaluate the Heller myotomy.  Operative Note: The patient is under general anesthesia.  Dr. Abbey Chattersosenbower is laparoscoping the patient while I do an upper endoscopy to evaluate the Heller myotomy.  With the patient intubated, I passed the Pentax upper endoscope without difficulty down the esophagus. The patient did have retained food in the esophagus.  Some of this I pushed into the stomach.  The esophago-gastric junction was at 38 cm.    The mucosa of the esophagus and proximal stomach looked viable and there was no evidence of perforation.   The opening of the distal esophagus was open and easily permitted the endoscope to pass.  While I insufflated the stomach pouch with air, Dr. Abbey Chattersosenbower flooded the upper abdomen with saline to put the [facility] under saline.  There was no bubbling or evidence of a leak.    The scope was then withdrawn.  The esophagus was unremarkable and the patient tolerated the endoscopy without difficulty.  Ovidio Kinavid Briellah Baik, MD, [facility] Pager: [phone] Office phone:  [phone]

## 2015-11-01 NOTE — H&P (Signed)
Theresa [name] is an 25 y.o. female.   Chief Complaint:   Here for elective surgery HPI:  She was diagnosed achalasia back in 2014. Since that time, she is undergone dilatation with a savory dilator and 2 Botox injections. One Botox injection lasted 12 months, the other lasted 6 months. Recently, she is becoming progressively symptomatic where now she is having trouble swallowing thin liquids. When she was first diagnosed with achalasia, it was recommended she have the Heller myotomy but she did not want to have this done. She has had an esophageal manometry in the past which is consistent with achalasia.  Upper GI findings demonstrated a dilated esophagus with bird beak narrowing. This was done in January 2015. Most recent upper endoscopy with Botox injection was done September 2016. She is lost about 10-15 pounds recently. She is now interested in having the esophageal myotomy  Past Medical History  Diagnosis Date  . Asthma   . Left fibular fracture 12/2011    referred by ED to outpt ortho. non-surgical mgt.   . Chlamydia age 25  . Obesity (BMI 30-39.9) 07/2013  . Achalasia   . Anxiety   . Depression   . GERD (gastroesophageal reflux disease)     Past Surgical History  Procedure Laterality Date  . Cesarean section  12/2008  . Tonsillectomy  07/2007    Dr Narda Bondshris Newman  . Esophagogastroduodenoscopy (egd) with esophageal dilation N/A 08/06/2013    Procedure: ESOPHAGOGASTRODUODENOSCOPY (EGD) WITH ESOPHAGEAL DILATION;  Surgeon: Hart Carwinora M Brodie, MD;  [facility];  Service: Endoscopy;  Laterality: N/A;  . Esophageal manometry N/A 08/18/2013    Procedure: ESOPHAGEAL MANOMETRY (EM);  Surgeon: Hart Carwinora M Brodie, MD;  Location: [facility] ENDOSCOPY;  Service: Endoscopy;  Laterality: N/A;  . Esophagogastroduodenoscopy N/A 01/26/2014    Procedure: ESOPHAGOGASTRODUODENOSCOPY (EGD);  Surgeon: Hart Carwinora M Brodie, MD;  [facility];  Service: Endoscopy;  Laterality: N/A;  . Botox injection N/A  01/26/2014    Procedure: BOTOX INJECTION;  Surgeon: Hart Carwinora M Brodie, MD;  Location: [facility] ENDOSCOPY;  Service: Endoscopy;  Laterality: N/A;  . Cesarean section N/A 03/23/2015    Procedure: CESAREAN SECTION;  Surgeon: Silverio LaySandra Rivard, MD;  Location: [facility] ORS;  Service: Obstetrics;  Laterality: N/A;  . Esophagogastroduodenoscopy N/A 03/25/2015    Procedure:  EGD with Botox;  Surgeon: Rachael Feeaniel P Jacobs, MD;  [facility];  Service: Endoscopy;  Laterality: N/A;    Family History  Problem Relation Age of Onset  . Hypertension Other   . Colon cancer Neg Hx   . Esophageal cancer Neg Hx   . Rectal cancer Neg Hx   . Stomach cancer Neg Hx    Social History:  reports that she quit smoking about a year ago. Her smoking use included Cigarettes. She smoked 0.25 packs per day. She has never used smokeless tobacco. She reports that she drinks alcohol. She reports that she does not use illicit drugs.  Allergies: No Known Allergies  Medications Prior to Admission  Medication Sig Dispense Refill  . medroxyPROGESTERone (DEPO-PROVERA) 150 MG/ML injection Inject 1 mL (150 mg total) into the muscle once. 1 mL 2  . NIFEdipine (PROCARDIA) 10 MG capsule Take 1 capsule (10 mg total) by mouth 3 (three) times daily before meals. 90 capsule 1  . sertraline (ZOLOFT) 50 MG tablet Take 50 mg by mouth daily.    Marland Kitchen. amoxicillin-clavulanate (AUGMENTIN) 400-57 MG/5ML suspension Take 10.9 mLs (875 mg total) by mouth 2 (two) times daily. (Patient not taking: Reported on 10/20/2015)  115 mL 0  . HYDROcodone-acetaminophen (HYCET) 7.5-325 mg/15 ml solution Take 15 mLs by mouth every 8 (eight) hours as needed for moderate pain. (Patient not taking: Reported on 09/22/2015) 120 mL 0  . metroNIDAZOLE (METROGEL VAGINAL) 0.75 % vaginal gel Place 1 Applicatorful vaginally at bedtime. Insert one applicator, at bedtime, for 5 nights. (Patient not taking: Reported on 09/22/2015) 70 g 0  . terconazole (TERAZOL 3) 80 MG vaginal suppository Place 1  suppository vaginally daily. Reported on 10/25/2015  0    No results found for this or any previous visit (from the past 48 hour(s)). No results found.  Review of Systems  Constitutional: Negative for fever and chills.  Gastrointestinal:       Dysphagia    Blood pressure 102/62, pulse 65, temperature 98.3 F (36.8 C), temperature source Oral, resp. rate 16, height  (1.626 m), weight 75.751 kg (167 lb), last menstrual period 10/02/2015, SpO2 100 %, not currently breastfeeding. Physical Exam  Constitutional:  Overweight female in NAD  HENT:  Head: Normocephalic and atraumatic.  Eyes: No scleral icterus.  Cardiovascular: Normal rate and regular rhythm.   Respiratory: Effort normal and breath sounds normal.  GI: Soft. There is no tenderness.  Neurological: She is alert.  Skin: Skin is warm and dry.  Psychiatric: She has a normal mood and affect. Her behavior is normal.     Assessment/Plan Achalasia-has failed to respond to other treatments.  Plan:  Laparoscopic Heller myotomy with Dor fundoplication.  We have discussed the procedure, risks, success rate, and aftercare previously.  Adolph Pollack, MD 11/01/2015, 7:18 AM

## 2015-11-02 ENCOUNTER — Inpatient Hospital Stay (HOSPITAL_COMMUNITY): Payer: Medicaid Other

## 2015-11-02 MED ORDER — PANTOPRAZOLE SODIUM 40 MG IV SOLR: 40.0000 mg | INTRAVENOUS | Status: DC

## 2015-11-02 MED ORDER — PANTOPRAZOLE SODIUM 40 MG IV SOLR
40.0000 mg | Freq: Two times a day (BID) | INTRAVENOUS | Status: DC
  Administered 2015-11-02 (×2): 40 mg via INTRAVENOUS
  Filled 2015-11-02 (×4): qty 40

## 2015-11-02 NOTE — Progress Notes (Signed)
X-rays seen and reviewed with Dr. Corky Downslsen.  Results discussed with her and her family.  No evidence of leak.  Findings c/w postop changes.  She has some nausea and has been spitting up her saliva since swallowing the gastrografin, no vomiting.  She is hungry.  Will remove foley and hold off on diet for now except or ice chips.  The importance of going slowly at this time was emphasized to her.

## 2015-11-02 NOTE — Progress Notes (Signed)
1 Day Post-Op  Subjective: Having some pain in upper abdomen and lower chest.  Sore throat.  No nausea.  Objective: Vital signs in last 24 hours: Temp:  [97.5 F (36.4 C)-98.6 F (37 C)] 98.1 F (36.7 C) (04/18 0446) Pulse Rate:  [48-86] 51 (04/18 0446) Resp:  [9-18] 18 (04/18 0446) BP: (106-129)/(58-78) 106/75 mmHg (04/18 0446) SpO2:  [99 %-100 %] 99 % (04/18 0446)    Intake/Output from previous day: 04/17 0701 - 04/18 0700 In: 3225 [I.V.:3225] Out: 1100 [Urine:1050; Blood:50] Intake/Output this shift:    PE: General- In NAD Lungs-clear Abdomen-soft, dressings dry  Lab Results:  No results for input(s): WBC, HGB, HCT, PLT in the last 72 hours. BMET No results for input(s): NA, K, CL, CO2, GLUCOSE, BUN, CREATININE, CALCIUM in the last 72 hours. PT/INR No results for input(s): LABPROT, INR in the last 72 hours. Comprehensive Metabolic Panel:    Component Value Date/Time   NA 141 10/26/2015 1145   NA 134* 03/24/2015 0713   K 3.7 10/26/2015 1145   K 3.4* 03/24/2015 0713   CL 110 10/26/2015 1145   CL 108 03/24/2015 0713   CO2 24 10/26/2015 1145   CO2 22 03/24/2015 0713   BUN 9 10/26/2015 1145   BUN 5* 03/24/2015 0713   CREATININE 0.73 10/26/2015 1145   CREATININE 0.43* 03/24/2015 0713   GLUCOSE 94 10/26/2015 1145   GLUCOSE 85 03/24/2015 0713   CALCIUM 9.4 10/26/2015 1145   CALCIUM 8.0* 03/24/2015 0713   AST 16 10/26/2015 1145   ALT 12* 10/26/2015 1145   ALKPHOS 71 10/26/2015 1145   BILITOT 0.5 10/26/2015 1145   PROT 7.7 10/26/2015 1145   ALBUMIN 4.3 10/26/2015 1145     Studies/Results: No results found.  Anti-infectives: Anti-infectives    Start     Dose/Rate Route Frequency Ordered Stop   11/01/15 1400  ceFAZolin (ANCEF) IVPB 2g/100 mL premix     2 g 200 mL/hr over 30 Minutes Intravenous 3 times per day 11/01/15 1237 11/01/15 1549   11/01/15 0601  ceFAZolin (ANCEF) 2 g in dextrose 5 % 50 mL IVPB     2 g 140 mL/hr over 30 Minutes Intravenous On  call to O.R. 11/01/15 0601 11/01/15 0737      Assessment Principal Problem:   Achalasia, esophageal s/p laparoscopic Heller myotomy and Dor fundoplication 11/01/15-stable overnight     LOS: 1 day   Plan: UGI this AM.  If no leak, will start liquid diet and remove foley.   Theresa [name] J 11/02/2015

## 2015-11-02 NOTE — Progress Notes (Signed)
Patient is complaining of severe gas pains on right upper quadrant radiating to right shoulder. Experienced similar symptoms last night but not as nearly as severe. Had Protonix IV last night but does not have any ordered during the day. Called placed to Dr. Maris Bergerosenbower's office. Will await return call. In the meantime provided a rocking chair for patient ane encouraged her to use it as well as ambulating when she feels that she can.

## 2015-11-03 MED ORDER — HYDROMORPHONE HCL 1 MG/ML IJ SOLN
1.0000 mg | INTRAMUSCULAR | Status: DC | PRN
  Administered 2015-11-04 (×3): 1 mg via INTRAVENOUS
  Filled 2015-11-03 (×4): qty 1

## 2015-11-03 MED ORDER — ALUM & MAG HYDROXIDE-SIMETH 200-200-20 MG/5ML PO SUSP: 30.0000 mL | Freq: Four times a day (QID) | ORAL | Status: DC | PRN

## 2015-11-03 MED ORDER — ACETAMINOPHEN 650 MG RE SUPP: 650.0000 mg | Freq: Four times a day (QID) | RECTAL | Status: DC | PRN

## 2015-11-03 MED ORDER — DEXTROSE 5 % IV SOLN
500.0000 mg | Freq: Four times a day (QID) | INTRAVENOUS | Status: DC | PRN
  Filled 2015-11-03: qty 5

## 2015-11-03 MED ORDER — MENTHOL 3 MG MT LOZG: 1.0000 | LOZENGE | OROMUCOSAL | Status: DC | PRN

## 2015-11-03 MED ORDER — FAMOTIDINE IN NACL 20-0.9 MG/50ML-% IV SOLN
20.0000 mg | Freq: Two times a day (BID) | INTRAVENOUS | Status: DC
  Administered 2015-11-03 (×2): 20 mg via INTRAVENOUS
  Filled 2015-11-03 (×3): qty 50

## 2015-11-03 MED ORDER — BISACODYL 10 MG RE SUPP: 10.0000 mg | Freq: Every day | RECTAL | Status: DC | PRN

## 2015-11-03 MED ORDER — MORPHINE SULFATE (PF) 2 MG/ML IV SOLN
2.0000 mg | INTRAVENOUS | Status: DC | PRN
  Administered 2015-11-03 – 2015-11-04 (×3): 6 mg via INTRAVENOUS
  Administered 2015-11-05: 2 mg via INTRAVENOUS
  Filled 2015-11-03: qty 3
  Filled 2015-11-03: qty 1
  Filled 2015-11-03 (×2): qty 3

## 2015-11-03 NOTE — Progress Notes (Signed)
2 Days Post-Op  Subjective: Feeling a little better today.  Has gas pains that seen to worsen after being given IV Protonix.  Some nausea.  Objective: Vital signs in last 24 hours: Temp:  [97.6 F (36.4 C)-98.6 F (37 C)] 97.6 F (36.4 C) (04/19 0607) Pulse Rate:  [45-89] 89 (04/19 0607) Resp:  [18] 18 (04/19 0607) BP: (100-116)/(52-81) 100/52 mmHg (04/19 0607) SpO2:  [98 %-100 %] 100 % (04/19 0607)    Intake/Output from previous day: 04/18 0701 - 04/19 0700 In: 900 [I.V.:900] Out: 910 [Urine:910] Intake/Output this shift:    PE: General- In NAD Abdomen-soft, dressings dry, hypoactive bowel sounds  Lab Results:  No results for input(s): WBC, HGB, HCT, PLT in the last 72 hours. BMET No results for input(s): NA, K, CL, CO2, GLUCOSE, BUN, CREATININE, CALCIUM in the last 72 hours. PT/INR No results for input(s): LABPROT, INR in the last 72 hours. Comprehensive Metabolic Panel:    Component Value Date/Time   NA 141 10/26/2015 1145   NA 134* 03/24/2015 0713   K 3.7 10/26/2015 1145   K 3.4* 03/24/2015 0713   CL 110 10/26/2015 1145   CL 108 03/24/2015 0713   CO2 24 10/26/2015 1145   CO2 22 03/24/2015 0713   BUN 9 10/26/2015 1145   BUN 5* 03/24/2015 0713   CREATININE 0.73 10/26/2015 1145   CREATININE 0.43* 03/24/2015 0713   GLUCOSE 94 10/26/2015 1145   GLUCOSE 85 03/24/2015 0713   CALCIUM 9.4 10/26/2015 1145   CALCIUM 8.0* 03/24/2015 0713   AST 16 10/26/2015 1145   ALT 12* 10/26/2015 1145   ALKPHOS 71 10/26/2015 1145   BILITOT 0.5 10/26/2015 1145   PROT 7.7 10/26/2015 1145   ALBUMIN 4.3 10/26/2015 1145     Studies/Results: Ct Chest Wo Contrast  11/02/2015  CLINICAL DATA:  25 year old female with achalasia post myotomy complaining of discomfort. Subsequent encounter. EXAM: CT CHEST WITHOUT CONTRAST TECHNIQUE: Multidetector CT imaging of the chest was performed following the standard protocol without IV contrast. COMPARISON:  Water-soluble upper GI series 418 2017.  FINDINGS: After review of the patient's recent water-soluble upper GI series with Dr. Abbey Chatters, it was elected to perform CT through the operative site. Dilated esophagus contains recently ingested contrast. This insinuates within folds but without evidence of extra luminal contrast to suggest the presence of a leak. Significant narrowing gastroesophageal junction/upper stomach. Pneumomediastinum. Tiny bilateral pneumothoraces. Right chest wall subcutaneous emphysema. Small amount of free intraperitoneal air. Tiny subcutaneous gas anterior abdominal wall. Small amount of fluid/edema upper abdomen. All these findings were reviewed with Dr. Abbey Chatters and may represent expected postoperative changes and will be monitored clinically with follow-up imaging if there are progressive symptoms. Basilar subsegmental atelectasis greater on the left. Right lung base 4 mm noncalcified nodule. If the patient is at high risk for bronchogenic carcinoma, follow-up chest CT at 1year is recommended. If the patient is at low risk, no follow-up is needed. This recommendation follows the consensus statement: Guidelines for Management of Small Pulmonary Nodules Detected on CT Scans: A Statement from the Fleischner Society as published in Radiology 2005; 237:395-400. No other abnormality noted on this unenhanced exam. IMPRESSION: Dilated esophagus contains recently ingested contrast. This insinuates within folds but without evidence of extra luminal contrast to suggest the presence of a leak. Significant narrowing gastroesophageal junction/upper stomach. Pneumomediastinum. Tiny bilateral pneumothoraces. Right chest wall subcutaneous emphysema. Small amount of free intraperitoneal air. Tiny subcutaneous gas anterior abdominal wall. Small amount of fluid/edema upper abdomen. All these  findings were reviewed with Dr. Abbey Chattersosenbower and may represent expected postoperative changes and will be monitored clinically with follow-up imaging if there  are progressive symptoms. Basilar subsegmental atelectasis greater on the left. Right lung base 4 mm noncalcified nodule. If the patient is at high risk for bronchogenic carcinoma, follow-up chest CT at 1year is recommended. If the patient is at low risk, no follow-up is needed. This recommendation follows the consensus statement: Guidelines for Management of Small Pulmonary Nodules Detected on CT Scans: A Statement from the Fleischner Society as published in Radiology 2005; 237:395-400. Electronically Signed   By: Lacy DuverneySteven  Olson M.D.   On: 11/02/2015 12:18   Dg Kayleen MemosUgi W/water Sol Cm  11/02/2015  CLINICAL DATA:  25 year old female post Heller myotomy and fundoplication. Initial encounter. EXAM: WATER SOLUBLE UPPER GI SERIES TECHNIQUE: Single-column upper GI series was performed using water soluble contrast. CONTRAST:  Gastroview. COMPARISON:  None. FLUOROSCOPY TIME:  Radiation Exposure Index (as provided by the fluoroscopic device): 44.7 micro Gray Fluoroscopy Time (in minutes and seconds):  1 minutes and 6 seconds. FINDINGS: Scout view with left base subsegmental atelectasis. Small amount free air below left hemidiaphragm consistent with patient's recent surgery. Dilated distal esophagus. Significant holdup of forward flow of ingested contrast. When contrast did traverse into the stomach, there is circumferential narrowing without leak identified. Study was terminated at this point. Patient was informed to continue with dry swallowing and stay in upright position. IMPRESSION: Dilated distal esophagus. Significant holdup of forward flow of ingested contrast at the distal esophagus level. When contrast did traverse into the stomach, there is circumferential narrowing without leak identified. Small amount of free intraperitoneal air consistent with patient's recent surgery. Left base atelectasis. Study was terminated at this point. Patient was informed to continue with dry swallowing and stay in upright position. These  results were called by telephone at the time of interpretation on 11/02/2015 at 10:57 am to Dr. Avel PeaceDD Marlowe Lawes , who verbally acknowledged these results. Electronically Signed   By: Lacy DuverneySteven  Olson M.D.   On: 11/02/2015 10:56    Anti-infectives: Anti-infectives    Start     Dose/Rate Route Frequency Ordered Stop   11/01/15 1400  ceFAZolin (ANCEF) IVPB 2g/100 mL premix     2 g 200 mL/hr over 30 Minutes Intravenous 3 times per day 11/01/15 1237 11/01/15 1549   11/01/15 0601  ceFAZolin (ANCEF) 2 g in dextrose 5 % 50 mL IVPB     2 g 140 mL/hr over 30 Minutes Intravenous On call to O.R. 11/01/15 0601 11/01/15 0737      Assessment Principal Problem:   Achalasia, esophageal s/p laparoscopic Heller myotomy and Dor fundoplication 11/01/15-feeling a little better today.     LOS: 2 days   Plan: Try liquid diet today.  Switch to Pepcid.  Arles Rumbold J 11/03/2015

## 2015-11-04 MED ORDER — ONDANSETRON HCL 4 MG/2ML IJ SOLN: 4.0000 mg | INTRAMUSCULAR | Status: DC | PRN

## 2015-11-04 MED ORDER — ONDANSETRON HCL 4 MG PO TABS
4.0000 mg | ORAL_TABLET | ORAL | Status: DC | PRN
Start: 2015-11-04 — End: 2015-11-17

## 2015-11-04 MED ORDER — METHOCARBAMOL 500 MG PO TABS
500.0000 mg | ORAL_TABLET | Freq: Four times a day (QID) | ORAL | Status: DC | PRN
  Administered 2015-11-04: 500 mg via ORAL
  Filled 2015-11-04: qty 1

## 2015-11-04 MED ORDER — MAGNESIUM HYDROXIDE 400 MG/5ML PO SUSP
30.0000 mL | Freq: Two times a day (BID) | ORAL | Status: DC
Start: 2015-11-04 — End: 2015-11-05
  Administered 2015-11-04 (×2): 30 mL via ORAL
  Filled 2015-11-04 (×3): qty 30

## 2015-11-04 MED ORDER — FAMOTIDINE 20 MG PO TABS
20.0000 mg | ORAL_TABLET | Freq: Two times a day (BID) | ORAL | Status: DC
  Administered 2015-11-04 – 2015-11-05 (×3): 20 mg via ORAL
  Filled 2015-11-04 (×4): qty 1

## 2015-11-04 MED ORDER — OXYCODONE HCL 5 MG PO TABS: 5.0000 mg | ORAL_TABLET | ORAL | Status: DC | PRN

## 2015-11-04 MED ORDER — OXYCODONE HCL 5 MG PO TABS
5.0000 mg | ORAL_TABLET | ORAL | Status: DC | PRN
  Administered 2015-11-04 – 2015-11-05 (×3): 10 mg via ORAL
  Filled 2015-11-04: qty 1
  Filled 2015-11-04 (×2): qty 2
  Filled 2015-11-04: qty 1

## 2015-11-04 MED ORDER — SERTRALINE HCL 50 MG PO TABS
50.0000 mg | ORAL_TABLET | Freq: Every day | ORAL | Status: DC
  Administered 2015-11-04 – 2015-11-05 (×2): 50 mg via ORAL
  Filled 2015-11-04 (×2): qty 1

## 2015-11-04 MED ORDER — ONDANSETRON 4 MG PO TBDP
4.0000 mg | ORAL_TABLET | ORAL | Status: DC | PRN
  Administered 2015-11-04: 4 mg via ORAL
  Filled 2015-11-04: qty 1

## 2015-11-04 NOTE — Progress Notes (Signed)
3 Days Post-Op  Subjective: Continues to feel a little better.  Having gas pains.  Has not been able to pass gas or have a BM.  Hears rumbling in her abdomen.  Tolerating clear liquid diet without nausea.  No heartburn.  Objective: Vital signs in last 24 hours: Temp:  [97.7 F (36.5 C)-98.5 F (36.9 C)] 98.5 F (36.9 C) (04/20 0556) Pulse Rate:  [50-53] 53 (04/20 0556) Resp:  [17-18] 18 (04/20 0556) BP: (102-122)/(63-81) 102/63 mmHg (04/20 0556) SpO2:  [100 %] 100 % (04/20 0556) Last BM Date: 10/31/15  Intake/Output from previous day: 04/19 0701 - 04/20 0700 In: 3223.8 [P.O.:1270; I.V.:1853.8; IV Piggyback:100] Out: 300 [Urine:300] Intake/Output this shift:    PE: General- In NAD Lungs-clear Abdomen-soft, incisions clean and intact, hypoactive bowel sounds  Lab Results:  No results for input(s): WBC, HGB, HCT, PLT in the last 72 hours. BMET No results for input(s): NA, K, CL, CO2, GLUCOSE, BUN, CREATININE, CALCIUM in the last 72 hours. PT/INR No results for input(s): LABPROT, INR in the last 72 hours. Comprehensive Metabolic Panel:    Component Value Date/Time   NA 141 10/26/2015 1145   NA 134* 03/24/2015 0713   K 3.7 10/26/2015 1145   K 3.4* 03/24/2015 0713   CL 110 10/26/2015 1145   CL 108 03/24/2015 0713   CO2 24 10/26/2015 1145   CO2 22 03/24/2015 0713   BUN 9 10/26/2015 1145   BUN 5* 03/24/2015 0713   CREATININE 0.73 10/26/2015 1145   CREATININE 0.43* 03/24/2015 0713   GLUCOSE 94 10/26/2015 1145   GLUCOSE 85 03/24/2015 0713   CALCIUM 9.4 10/26/2015 1145   CALCIUM 8.0* 03/24/2015 0713   AST 16 10/26/2015 1145   ALT 12* 10/26/2015 1145   ALKPHOS 71 10/26/2015 1145   BILITOT 0.5 10/26/2015 1145   PROT 7.7 10/26/2015 1145   ALBUMIN 4.3 10/26/2015 1145     Studies/Results: Ct Chest Wo Contrast  11/02/2015  CLINICAL DATA:  25 year old female with achalasia post myotomy complaining of discomfort. Subsequent encounter. EXAM: CT CHEST WITHOUT CONTRAST  TECHNIQUE: Multidetector CT imaging of the chest was performed following the standard protocol without IV contrast. COMPARISON:  Water-soluble upper GI series 418 2017. FINDINGS: After review of the patient's recent water-soluble upper GI series with Dr. Abbey Chatters, it was elected to perform CT through the operative site. Dilated esophagus contains recently ingested contrast. This insinuates within folds but without evidence of extra luminal contrast to suggest the presence of a leak. Significant narrowing gastroesophageal junction/upper stomach. Pneumomediastinum. Tiny bilateral pneumothoraces. Right chest wall subcutaneous emphysema. Small amount of free intraperitoneal air. Tiny subcutaneous gas anterior abdominal wall. Small amount of fluid/edema upper abdomen. All these findings were reviewed with Dr. Abbey Chatters and may represent expected postoperative changes and will be monitored clinically with follow-up imaging if there are progressive symptoms. Basilar subsegmental atelectasis greater on the left. Right lung base 4 mm noncalcified nodule. If the patient is at high risk for bronchogenic carcinoma, follow-up chest CT at 1year is recommended. If the patient is at low risk, no follow-up is needed. This recommendation follows the consensus statement: Guidelines for Management of Small Pulmonary Nodules Detected on CT Scans: A Statement from the Fleischner Society as published in Radiology 2005; 237:395-400. No other abnormality noted on this unenhanced exam. IMPRESSION: Dilated esophagus contains recently ingested contrast. This insinuates within folds but without evidence of extra luminal contrast to suggest the presence of a leak. Significant narrowing gastroesophageal junction/upper stomach. Pneumomediastinum. Tiny bilateral pneumothoraces. Right  chest wall subcutaneous emphysema. Small amount of free intraperitoneal air. Tiny subcutaneous gas anterior abdominal wall. Small amount of fluid/edema upper  abdomen. All these findings were reviewed with Dr. Abbey Chattersosenbower and may represent expected postoperative changes and will be monitored clinically with follow-up imaging if there are progressive symptoms. Basilar subsegmental atelectasis greater on the left. Right lung base 4 mm noncalcified nodule. If the patient is at high risk for bronchogenic carcinoma, follow-up chest CT at 1year is recommended. If the patient is at low risk, no follow-up is needed. This recommendation follows the consensus statement: Guidelines for Management of Small Pulmonary Nodules Detected on CT Scans: A Statement from the Fleischner Society as published in Radiology 2005; 237:395-400. Electronically Signed   By: Lacy DuverneySteven  Olson M.D.   On: 11/02/2015 12:18   Dg Kayleen MemosUgi W/water Sol Cm  11/02/2015  CLINICAL DATA:  25 year old female post Heller myotomy and fundoplication. Initial encounter. EXAM: WATER SOLUBLE UPPER GI SERIES TECHNIQUE: Single-column upper GI series was performed using water soluble contrast. CONTRAST:  Gastroview. COMPARISON:  None. FLUOROSCOPY TIME:  Radiation Exposure Index (as provided by the fluoroscopic device): 44.7 micro Gray Fluoroscopy Time (in minutes and seconds):  1 minutes and 6 seconds. FINDINGS: Scout view with left base subsegmental atelectasis. Small amount free air below left hemidiaphragm consistent with patient's recent surgery. Dilated distal esophagus. Significant holdup of forward flow of ingested contrast. When contrast did traverse into the stomach, there is circumferential narrowing without leak identified. Study was terminated at this point. Patient was informed to continue with dry swallowing and stay in upright position. IMPRESSION: Dilated distal esophagus. Significant holdup of forward flow of ingested contrast at the distal esophagus level. When contrast did traverse into the stomach, there is circumferential narrowing without leak identified. Small amount of free intraperitoneal air consistent  with patient's recent surgery. Left base atelectasis. Study was terminated at this point. Patient was informed to continue with dry swallowing and stay in upright position. These results were called by telephone at the time of interpretation on 11/02/2015 at 10:57 am to Dr. Avel PeaceDD Alexavier Tsutsui , who verbally acknowledged these results. Electronically Signed   By: Lacy DuverneySteven  Olson M.D.   On: 11/02/2015 10:56    Anti-infectives: Anti-infectives    Start     Dose/Rate Route Frequency Ordered Stop   11/01/15 1400  ceFAZolin (ANCEF) IVPB 2g/100 mL premix     2 g 200 mL/hr over 30 Minutes Intravenous 3 times per day 11/01/15 1237 11/01/15 1549   11/01/15 0601  ceFAZolin (ANCEF) 2 g in dextrose 5 % 50 mL IVPB     2 g 140 mL/hr over 30 Minutes Intravenous On call to O.R. 11/01/15 0601 11/01/15 0737      Assessment Principal Problem:   Achalasia, esophageal s/p laparoscopic Heller myotomy and Dor fundoplication 11/01/15-continues to slowly feel better each day but has an ileus     LOS: 3 days   Plan: Advance to full liquid diet.  MOM.  Heplock IV.  Oral analgesic.  We went over discharge instructions in detail.  Hopefully home tomorrow or less likely, this afternoon.  Cathyrn Deas Shela CommonsJ 11/04/2015

## 2015-11-04 NOTE — Discharge Instructions (Addendum)
[facility] Post [facility] Home Care Instructions  Stay on a Level 1 diet-"blendarized foods."  Can also drink Boost or Ensure. No lifting push or pulling over 10 pounds until released to do so by MD. Do not drive until you are pain-free. Walk frequently. You may shower. Call for high fever (>101.5), persistent vomiting, wound problems.   EATING AFTER YOUR ESOPHAGEAL SURGERY  After your esophageal surgery, you can expect some difficulty swallowing.  If food sticks when you eat, it is called "dysphagia".  This is due to swelling around your surgery site and will most likely resolve within a few weeks.  To help you through this temporary phase, we start you out on a pureed diet.  Your first meal in the hospital was clear liquids.  You should have been given a pureed diet by the time you left the hospital.  We ask patients to stay on a pureed diet for the first two weeks to avoid anything getting "stuck" near your recent surgery.  Don't be alarmed if your ability to swallow doesn't progress according to this plan.  Everyone is different and some take longer or shorter.  Use common sense.  If you are having trouble swallowing a particular food, then avoid it.  If food is sticking when you advance your diet, go back to the previous day or two.  In general some simple rules to follow are:  Maintain an upright position (as near 90 degrees as possible) whenever eating or drinking.  Take small bites - only 1/2 to 1 teaspoon at a time.  Eat slowly.  It may also help to eat only one food at a time.  Stop eating when you first start feeling full.  Do not overeat.  Avoid talking while eating.  Do not mix solid foods and liquids in the same mouthful and do not "wash foods down" with liquids, unless you have been instructed to do so by your surgeon.  Eat in a relaxed atmosphere, with no distractions.  Following each meal, sit in an upright position (90 degree angle) for 60 to  90 minutes.  Avoid carbonated (bubbly) drinks.  If food does stick, don't panic.  Try to relax and let the food pass on its own.  Sipping strong hot black tea can also help.  If you have any questions please call our office at [phone].   LEVEL 1 PUREED FOODS:  1ST 2 WEEKS AFTER SURGERY Foods in this group are pureed or blenderized to a smooth, mashed potato-like consistency.  If necessary, the pureed foods can keep their shape with the addition of a thickening agent.  Meat should be pureed to a smooth pasty consistency.  Hot broth or gravy may be added to the pureed meat, approximately 1 oz. of liquid per 3 oz. serving of meat. CAUTION:  If any foods do not puree into a smooth consistency, it may make eating for swallowing more difficult.  For example, zucchini seeds sometimes do not blend well. Hot Foods Cold Foods  Pureed scrambled eggs and cheese Pureed cottage cheese  Baby cereals Thickened juices and nectars  Thinned cooked cereals (no lumps) Thickened milk or eggnog  Pureed [place] toast or pancakes Ensure  Mashed potatoes Ice cream  Pureed parsley, au gratin, scalloped potatoes, candied sweet potatoes Fruit or [place] ice, sherbet  Pureed buttered or alfredo noodles Plain yogurt  Pureed vegetables (no corn or peas) Instant breakfast  Pureed soups and creamed soups Smooth pudding, mousse, custard  Pureed scalloped apples Whipped gelatin  Gravies Sugar, syrup, honey, jelly  Sauces, cheese, tomato, barbecue, white, creamed Cream  Any baby food Creamer  Alcohol in moderation (not beer or champagne) Margarine  Coffee or tea Mayonnaise   Ketchup, mustard   Apple sauce   SAMPLE MENU:  PUREED DIET Breakfast Lunch Dinner   Orange juice, 1/2 cup  Cream of wheat, 1/2 cup  Pineapple juice, 1/2 cup  Pureed [place], barley soup, 3/4 cup  Pureed Hawaiian chicken, 3 oz   Scrambled eggs, mashed or blended with cheese, 1/2 cup  Tea or coffee, 1 cup   Whole milk, 1 cup    Non-dairy creamer, 2 Tbsp.  Mashed potatoes, 1/2 cup  Pureed cooled broccoli, 1/2 cup  Apple sauce, 1/2 cup  Coffee or tea  Mashed potatoes, 1/2 cup  Pureed spinach, 1/2 cup  Frozen yogurt, 1/2 cup  Tea or coffee    LEVEL 2 After your first 2 weeks, you can advance to a soft diet.  Keep on this diet until everything goes down easily. Hot Foods Cold Foods  White fish Cottage cheese  Stuffed fish Junior baby fruit  Baby food meals Semi thickened juices  Minced soft cooked, scrambled, poached eggs nectars  Souffle & omelets Ripe mashed bananas  Cooked cereals Canned fruit, pineapple sauce, milk  potatoes Milkshake  Buttered or Alfredo noodles Custard  Cooked cooled vegetable Puddings, including tapioca  Sherbet Yogurt  Vegetable soup or alphabet soup Fruit ice, [place] ice  Gravies Whipped gelatin  Sugar, syrup, honey, jelly Junior baby desserts  Sauces:  Cheese, creamed, barbecue, tomato, white Cream  Coffee or tea Margarine   SAMPLE MENU:  LEVEL 2 Breakfast Lunch Dinner   Orange juice, 1/2 cup  Oatmeal, 1/2 cup  Scrambled eggs with cheese, 1/2 cup  Decaffeinated tea, 1 cup  Whole milk, 1 cup  Non-dairy creamer, 2 Tbsp  Pineapple juice, 1/2 cup  Minced beef, 3 oz  Gravy, 2 Tbsp  Mashed potatoes, 1/2 cup  Minced fresh broccoli, 1/2 cup  Applesauce, 1/2 cup  Coffee, 1 cup  [place], barley soup, 3/4 cup  Minced Hawaiian chicken, 3 oz  Mashed potatoes, 1/2 cup  Cooked spinach, 1/2 cup  Frozen yogurt, 1/2 cup  Non-dairy creamer, 2 Tbsp    LEVEL 3 After all the foods in level 2 (soft diet) are passing through well you should advance up to the next level.  It is still important to cut these foods into small pieces and eat slowly. Hot Foods Cold Foods  Poultry Cottage cheese  Chopped Swedish meatballs Yogurt  Meat salads (ground or flaked meat) Milk  Flaked fish (tuna) Milkshakes  Poached or scrambled eggs Soft, cold, dry cereal  Souffles  and omelets Fruit juices or nectars  Cooked cereals Chopped canned fruit  Chopped [place] toast or pancakes Canned fruit cocktail  Noodles or pasta (no rice) Pudding, mousse, custard  Cooked vegetables (no frozen peas, corn, or mixed vegetables) Green salad  Canned small sweet peas Ice cream  Creamed soup or vegetable soup Fruit ice, [place] ice  Pureed vegetable soup or alphabet soup Non-dairy creamer  Ground scalloped apples Margarine  Gravies Mayonnaise  Sauces:  Cheese, creamed, barbecue, tomato, white Ketchup  Coffee or tea Mustard   SAMPLE MENU:  LEVEL 3 Breakfast Lunch Dinner   Orange juice, 1/2 cup  Oatmeal, 1/2 cup  Scrambled eggs with cheese, 1/2 cup  Decaffeinated tea, 1 cup  Whole milk, 1 cup  Non-dairy creamer, 2 Tbsp  Ketchup, 1 Tbsp  Margarine, 1 tsp  Salt, 1/4 tsp  Sugar, 2 tsp  Pineapple juice, 1/2 cup  Ground beef, 3 oz  Gravy, 2 Tbsp  Mashed potatoes, 1/2 cup  Cooked spinach, 1/2 cup  Applesauce, 1/2 cup  Decaffeinated coffee  Whole milk  Non-dairy creamer, 2 Tbsp  Margarine, 1 tsp  Salt, 1/4 tsp  Pureed [place], barley soup, 3/4 cup  Barbecue chicken, 3 oz  Mashed potatoes, 1/2 cup  Ground fresh broccoli, 1/2 cup  Frozen yogurt, 1/2 cup  Decaffeinated tea, 1 cup  Non-dairy creamer, 2 Tbsp  Margarine, 1 tsp  Salt, 1/4 tsp  Sugar, 1 tsp    LEVEL 4:  REGULAR FOODS Foods in this group are soft, moist, regularly textured foods.  This level includes red meat and breads, which tend to be the hardest things to swallow.  Eat very slow, chew well and continue to avoid carbonated drinks. Hot Foods Cold Foods  Baked fish or skinned Soft cheeses - cottage cheese  Souffles and omelets Cream cheese  Eggs Yogurt  Stuffed shells Milk  Spaghetti with meat sauce Milkshakes  Cooked cereal Cold dry cereals (no nuts, dried fruit, coconut)  [place] toast or pancakes Crackers  Buttered toast Fruit juices or nectars  Noodles or pasta (no  rice) Canned fruit  Potatoes (all types) Ripe bananas  Soft, cooked vegetables (no corn, lima, or baked beans) Peeled, ripe, fresh fruit  Creamed soups or vegetable soup Cakes (no nuts, dried fruit, coconut)  Canned chicken noodle soup Plain doughnuts  Gravies Ice cream  Bacon dressing Pudding, mousse, custard  Sauces:  Cheese, creamed, barbecue, tomato, white Fruit ice, [place] ice, sherbet  Decaffeinated tea or coffee Whipped gelatin  Pork chops Regular gelatin   Canned fruited gelatin molds   Sugar, syrup, honey, jam, jelly   Cream   Non-dairy   Margarine   Oil   Mayonnaise   Ketchup   Mustard

## 2015-11-05 MED ORDER — OXYCODONE-ACETAMINOPHEN 5-325 MG/5ML PO SOLN: 5.0000 mL | ORAL | Status: DC | PRN

## 2015-11-05 MED ORDER — FAMOTIDINE 20 MG PO TABS: 20.0000 mg | ORAL_TABLET | Freq: Two times a day (BID) | ORAL | Status: DC

## 2015-11-05 MED ORDER — METHOCARBAMOL 500 MG PO TABS: 500.0000 mg | ORAL_TABLET | Freq: Four times a day (QID) | ORAL | Status: DC | PRN

## 2015-11-05 NOTE — Progress Notes (Signed)
4 Days Post-Op  Subjective: Bowels moving.  Tolerating clear liquids and some full liquids.  Has some discomfort in her lower right chest at times with swallowing.  No n/v.  Anxious/nervous about going home. Objective: Vital signs in last 24 hours: Temp:  [98.1 F (36.7 C)-98.6 F (37 C)] 98.1 F (36.7 C) (04/21 16100632) Pulse Rate:  [52-58] 58 (04/21 0632) Resp:  [16-18] 16 (04/21 [id number]) BP: (114-116)/(59-66) 114/66 mmHg (04/21 54090632) SpO2:  [100 %] 100 % (04/21 81190632) Last BM Date: 10/31/15  Intake/Output from previous day: 04/20 0701 - 04/21 0700 In: 650 [P.O.:650] Out: -  Intake/Output this shift:    PE: General- In NAD Lungs-clear Abdomen-soft, incisions clean and intact, active bowel sounds  Lab Results:  No results for input(s): WBC, HGB, HCT, PLT in the last 72 hours. BMET No results for input(s): NA, K, CL, CO2, GLUCOSE, BUN, CREATININE, CALCIUM in the last 72 hours. PT/INR No results for input(s): LABPROT, INR in the last 72 hours. Comprehensive Metabolic Panel:    Component Value Date/Time   NA 141 10/26/2015 1145   NA 134* 03/24/2015 0713   K 3.7 10/26/2015 1145   K 3.4* 03/24/2015 0713   CL 110 10/26/2015 1145   CL 108 03/24/2015 0713   CO2 24 10/26/2015 1145   CO2 22 03/24/2015 0713   BUN 9 10/26/2015 1145   BUN 5* 03/24/2015 0713   CREATININE 0.73 10/26/2015 1145   CREATININE 0.43* 03/24/2015 0713   GLUCOSE 94 10/26/2015 1145   GLUCOSE 85 03/24/2015 0713   CALCIUM 9.4 10/26/2015 1145   CALCIUM 8.0* 03/24/2015 0713   AST 16 10/26/2015 1145   ALT 12* 10/26/2015 1145   ALKPHOS 71 10/26/2015 1145   BILITOT 0.5 10/26/2015 1145   PROT 7.7 10/26/2015 1145   ALBUMIN 4.3 10/26/2015 1145     Studies/Results: No results found.  Anti-infectives: Anti-infectives    Start     Dose/Rate Route Frequency Ordered Stop   11/01/15 1400  ceFAZolin (ANCEF) IVPB 2g/100 mL premix     2 g 200 mL/hr over 30 Minutes Intravenous 3 times per day 11/01/15 1237 11/01/15  1549   11/01/15 0601  ceFAZolin (ANCEF) 2 g in dextrose 5 % 50 mL IVPB     2 g 140 mL/hr over 30 Minutes Intravenous On call to O.R. 11/01/15 0601 11/01/15 0737      Assessment Principal Problem:   Achalasia, esophageal s/p laparoscopic Heller myotomy and Dor fundoplication 11/01/15-ileus has resolved; having some anxiety about going home but I feel she meets the criteria.    LOS: 4 days   Plan: Discharge today.  Follow up in 2-3 weeks.  Paddy Walthall Shela CommonsJ 11/05/2015

## 2015-11-05 NOTE — Progress Notes (Signed)
Discharge instructions discussed with patient including, medication regimen, diet, and activity restrictions. Patient verbalized agreement and understanding of discharge instructions.  Prescriptions given to patient.  Discharged with family via private auto

## 2015-11-05 NOTE — Discharge Summary (Signed)
Physician Discharge Summary  Patient ID: Theresa [name] MRN: [id number] DOB/AGE: [birth year] 27 y.o.  Admit date: 11/01/2015 Discharge date: 11/05/2015  Admission Diagnoses:  Achalasia  Discharge Diagnoses:  Principal Problem:   Achalasia, esophageal s/p laparoscopic Heller myotomy and Dor fundoplication 6/80/88    Discharged Condition: good  Hospital Course: she was admitted and underwent the above procedure on April 17. On postop day 1 an upper GI was done. There is a slight area of irregularity and a CT was done following that which did not demonstrate any leak. CT also demonstrated typical postoperative changes for the operation. She had some nausea so we held the diet that day. Following this, we began her on some clear liquids and slowly advanced her diet to full liquids. She was taking a fair amount of narcotic pain medicine and got an ileus. This resolved with the help of some milk of magnesia.She was ambulatory. She is afebrile and had normal vital signs throughout her hospital course. By her fourth postoperative day she was nervous about going home but she met all the criteria. She was tolerating a clear and full liquid diet. She is given very detailed diet and activity restrictions. She was discharged. She will follow up in the office in 2-3 weeks. All questions were answered.  Discharge Exam: Blood pressure 114/66, pulse 58, temperature 98.1 F (36.7 C), temperature source Oral, resp. rate 16, height _0  (1.626 m), weight 75.751 kg (167 lb), last menstrual period 10/02/2015, SpO2 100 %, not currently breastfeeding.   Disposition: 01-Home or Self Care     Medication List    STOP taking these medications        amoxicillin-clavulanate 400-57 MG/5ML suspension  Commonly known as:  AUGMENTIN     HYDROcodone-acetaminophen 7.5-325 mg/15 ml solution  Commonly known as:  HYCET     NIFEdipine 10 MG capsule  Commonly known as:  PROCARDIA      TAKE these medications        famotidine 20 MG tablet  Commonly known as:  PEPCID  Take 1 tablet (20 mg total) by mouth 2 (two) times daily.     medroxyPROGESTERone 150 MG/ML injection  Commonly known as:  DEPO-PROVERA  Inject 1 mL (150 mg total) into the muscle once.     methocarbamol 500 MG tablet  Commonly known as:  ROBAXIN  Take 1 tablet (500 mg total) by mouth every 6 (six) hours as needed for muscle spasms.     metroNIDAZOLE 0.75 % vaginal gel  Commonly known as:  METROGEL VAGINAL  Place 1 Applicatorful vaginally at bedtime. Insert one applicator, at bedtime, for 5 nights.     ondansetron 4 MG tablet  Commonly known as:  ZOFRAN  Take 1 tablet (4 mg total) by mouth every 4 (four) hours as needed for nausea.     oxyCODONE 5 MG immediate release tablet  Commonly known as:  Oxy IR/ROXICODONE  Take 1-2 tablets (5-10 mg total) by mouth every 4 (four) hours as needed for moderate pain.     sertraline 50 MG tablet  Commonly known as:  ZOLOFT  Take 50 mg by mouth daily.     terconazole 80 MG vaginal suppository  Commonly known as:  TERAZOL 3  Place 1 suppository vaginally daily. Reported on 10/25/2015         Signed: Liviya Santini J 11/05/2015, 8:07 AM

## 2015-11-05 NOTE — Progress Notes (Signed)
Patient stated  she feels that when she drinks liquid the fluid is not going straight down esophagus but feels like its going to the left side.  Denies any coughing and no wheezing heard on left lobe  but more adventitious sound noted.

## 2015-11-16 ENCOUNTER — Inpatient Hospital Stay (HOSPITAL_COMMUNITY)
Admission: AD | Admit: 2015-11-16 | Discharge: 2015-11-16 | Discharge status: Against Medical Advice | Payer: Medicaid Other | Source: Ambulatory Visit | Attending: Obstetrics & Gynecology | Admitting: Obstetrics & Gynecology

## 2015-11-16 DIAGNOSIS — Z5321 Procedure and treatment not carried out due to patient leaving prior to being seen by health care provider: Secondary | ICD-10-CM | POA: Insufficient documentation

## 2015-11-16 DIAGNOSIS — N898 Other specified noninflammatory disorders of vagina: Secondary | ICD-10-CM | POA: Diagnosis present

## 2015-11-16 LAB — POCT PREGNANCY, URINE: PREG TEST UR: NEGATIVE

## 2015-11-16 NOTE — MAU Note (Addendum)
Just had a GI procedure. Was on a lot of antibiotics.  Was given medication for yeast infection, but doesn't feel it was enough.  Has a d/c and itching.  Unable to get an appt at [facility]< was told to come herre

## 2015-11-16 NOTE — MAU Note (Signed)
Not in lobby #3 

## 2015-11-16 NOTE — MAU Note (Signed)
#  2 not in lobby 

## 2015-11-16 NOTE — MAU Note (Signed)
Urine in lab 

## 2015-11-16 NOTE — MAU Note (Addendum)
Not in lobby #1  (saw her get up and walk out when I was talking to another pt)

## 2015-11-17 ENCOUNTER — Inpatient Hospital Stay (HOSPITAL_COMMUNITY)
Admission: AD | Admit: 2015-11-17 | Discharge: 2015-11-17 | Disposition: A | Discharge status: Home / Self Care | Payer: Medicaid Other | Source: Ambulatory Visit | Attending: Obstetrics & Gynecology | Admitting: Obstetrics & Gynecology

## 2015-11-17 DIAGNOSIS — K219 Gastro-esophageal reflux disease without esophagitis: Secondary | ICD-10-CM | POA: Diagnosis not present

## 2015-11-17 DIAGNOSIS — Z87891 Personal history of nicotine dependence: Secondary | ICD-10-CM | POA: Insufficient documentation

## 2015-11-17 DIAGNOSIS — F329 Major depressive disorder, single episode, unspecified: Secondary | ICD-10-CM | POA: Diagnosis not present

## 2015-11-17 DIAGNOSIS — Z79899 Other long term (current) drug therapy: Secondary | ICD-10-CM | POA: Diagnosis not present

## 2015-11-17 DIAGNOSIS — N898 Other specified noninflammatory disorders of vagina: Secondary | ICD-10-CM | POA: Diagnosis not present

## 2015-11-17 DIAGNOSIS — L298 Other pruritus: Secondary | ICD-10-CM | POA: Diagnosis not present

## 2015-11-17 LAB — WET PREP, GENITAL
Clue Cells Wet Prep HPF POC: NONE SEEN
SPERM: NONE SEEN
Trich, Wet Prep: NONE SEEN
YEAST WET PREP: NONE SEEN

## 2015-11-17 MED ORDER — NYSTATIN 100000 UNIT/GM EX CREA: 1.0000 "application " | TOPICAL_CREAM | Freq: Two times a day (BID) | CUTANEOUS | Status: DC

## 2015-11-17 MED ORDER — TRIAMCINOLONE ACETONIDE 0.5 % EX OINT: 1.0000 "application " | TOPICAL_OINTMENT | Freq: Two times a day (BID) | CUTANEOUS | Status: DC

## 2015-11-17 NOTE — Discharge Instructions (Signed)
Lichen Sclerosus Lichen sclerosus is a skin problem. It can happen on any part of the body, but it commonly involves the anal or genital areas. It can cause itching and discomfort in these areas. Treatment can help to control symptoms. When the genital area is affected, getting treatment is important because the condition can cause scarring that may lead to other problems. CAUSES The cause of this condition is not known. It could be the result of an overactive immune system or a lack of certain hormones. Lichen sclerosus is not an infection or a fungus. It is not passed from one person to another (not contagious). RISK FACTORS This condition is more likely to develop in women, usually after menopause. SYMPTOMS Symptoms of this condition include:  Thin, wrinkled, white areas on the skin.  Thickened white areas on the skin.  Red and swollen patches (lesions) on the skin.  Tears or cracks in the skin.  Bruising.  Blood blisters.  Severe itching. You may also have pain, itching, or burning with urination. Constipation is also common in people with lichen sclerosus. DIAGNOSIS This condition may be diagnosed with a physical exam. In some cases, a tissue sample (biopsy sample) may be removed to be looked at under a microscope. TREATMENT This condition is usually treated with medicated creams or ointments (topical steroids) that are applied over the affected areas. HOME CARE INSTRUCTIONS  Take over-the-counter and prescription medicines only as told by your health care provider.  Use creams or ointments as told by your health care provider.  Do not scratch the affected areas of skin.  Women should keep the vaginal area as clean and dry as possible.  Keep all follow-up visits as told by your health care provider. This is important. SEEK MEDICAL CARE IF:  You have increasing redness, swelling, or pain in the affected area.  You have fluid, blood, or pus coming from the affected  area.  You have new lesions on your skin.  You have pain or burning with urination.  You have pain during sex.   This information is not intended to replace advice given to you by your health care provider. Make sure you discuss any questions you have with your health care provider.   Document Released: 11/23/2010 Document Revised: 03/24/2015 Document Reviewed: 09/28/2014 Elsevier Interactive Patient Education 2016 [employer]  

## 2015-11-17 NOTE — MAU Note (Signed)
PT  SAYS  SHE CAME  THIS AFTERNOON-  WAS BUSY  SO SHE LEFT.     [facility] DEL BABY BY C/S ON 03-23-2015      .  LAST DEPO- WAS 08-09-2015.-  SAYS SHE BLED  TOO MUCH         NO BIRTH CONTROL NOW.        LAST SEX-   1 WEEK AGO    HAS APPOINTMENT    ON  WED  IN OFFICE-  TO GET NEXPLANON.       CAME NOW  FOR VAG  D/C AND ITCHING.          WAS ON ANTX FOR 5 DAYS  BC OF SURGERY   ( 11-01-2015  )                                        ON ESOPHAGUS.       SHE  USED OTC  CREAMS - NO RELIEF.

## 2015-11-17 NOTE — MAU Provider Note (Signed)
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  Patient states she had recent surgery and was in the hospital for one week.  During this time she had IV antibiotics and started having symptoms of itching and "a little bit" of discharge a week ago.  Patient reports she took a prescription cream, that was given to her prior to discharge, and states that "it wasn't enough."  Patient states she continues to have itching and slight discharge.  Patient denies pain, problems with urination, constipation, or diarrhea.    Patient Active Problem List   Diagnosis Date Noted  . Achalasia 03/24/2015  . 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 Status post repeat low transverse cesarean section 03/23/2015  . Cesarean delivery delivered 03/23/2015  . H/O: depression 11/24/2014  . H/O: C-section - 2010 - NRFHRT 11/23/2014  . Breast lump 11/23/2014  . Infection due to enterococcus - at [facility] - treated 11/23/2014  . Vomiting 08/06/2013  . Tobacco abuse 08/06/2013  . 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  HPI  OB History    Gravida Para Term Preterm AB TAB SAB Ectopic Multiple Living   3 2 2  1 1    0 2      Past Medical History  Diagnosis Date  . Asthma   . Left fibular fracture 12/2011    referred by ED to outpt ortho. non-surgical mgt.   . Chlamydia age 25  . Obesity (BMI 30-39.9) 07/2013  . Achalasia   . Anxiety   . Depression   . GERD (gastroesophageal reflux disease)     Past Surgical History  Procedure Laterality Date  . Cesarean section  12/2008  . Tonsillectomy  07/2007    Dr Narda Bondshris Newman  . Esophagogastroduodenoscopy (egd) with esophageal dilation N/A 08/06/2013    Procedure: ESOPHAGOGASTRODUODENOSCOPY (EGD) WITH ESOPHAGEAL DILATION;  Surgeon: Hart Carwinora M Brodie, MD;  [facility];  Service: Endoscopy;  Laterality: N/A;  . Esophageal manometry N/A 08/18/2013   Procedure: ESOPHAGEAL MANOMETRY (EM);  Surgeon: Hart Carwinora M Brodie, MD;  Location: [facility] ENDOSCOPY;  Service: Endoscopy;  Laterality: N/A;  . Esophagogastroduodenoscopy N/A 01/26/2014    Procedure: ESOPHAGOGASTRODUODENOSCOPY (EGD);  Surgeon: Hart Carwinora M Brodie, MD;  [facility];  Service: Endoscopy;  Laterality: N/A;  . Botox injection N/A 01/26/2014    Procedure: BOTOX INJECTION;  Surgeon: Hart Carwinora M Brodie, MD;  Location: [facility] ENDOSCOPY;  Service: Endoscopy;  Laterality: N/A;  . Cesarean section N/A 03/23/2015    Procedure: CESAREAN SECTION;  Surgeon: Silverio LaySandra Rivard, MD;  Location: [facility] ORS;  Service: Obstetrics;  Laterality: N/A;  . Esophagogastroduodenoscopy N/A 03/25/2015    Procedure:  EGD with Botox;  Surgeon: Rachael Feeaniel P Jacobs, MD;  [facility];  Service: Endoscopy;  Laterality: N/A;  Idell Pickles. Heller myotomy N/A 11/01/2015    Procedure: LAPAROSCOPIC HELLER MYOTOMY AND DOR FUNDOPLICATION;  Surgeon: Avel Peaceodd Rosenbower, MD;  Location: [facility] ORS;  Service: General;  Laterality: N/A;  . Esophagogastroduodenoscopy N/A 11/01/2015    Procedure: ESOPHAGOGASTRODUODENOSCOPY (EGD);  Surgeon: Avel Peaceodd Rosenbower, MD;  Location: [facility] ORS;  Service: General;  Laterality: N/A;    Family History  Problem Relation Age of Onset  . Hypertension Other   . Colon cancer Neg Hx   . Esophageal cancer Neg Hx   . Rectal cancer Neg Hx   . Stomach cancer Neg Hx     Social History  Substance Use Topics  .  Smoking status: Former Smoker -- 0.25 packs/day    Types: Cigarettes    Quit date: 10/24/2014  . Smokeless tobacco: Never Used  . Alcohol Use: Yes     Comment: occassionally    Allergies: No Known Allergies  Prescriptions prior to admission  Medication Sig Dispense Refill Last Dose  . famotidine (PEPCID) 20 MG tablet Take 1 tablet (20 mg total) by mouth 2 (two) times daily. 60 tablet 1 Past Week at Unknown time  . methocarbamol (ROBAXIN) 500 MG tablet Take 1 tablet (500 mg total) by mouth every 6 (six) hours as needed for  muscle spasms. 30 tablet 1 11/16/2015 at Unknown time  . oxyCODONE-acetaminophen (ROXICET) 5-325 MG/5ML solution Take 5 mLs by mouth every 4 (four) hours as needed for severe pain. 500 mL 0 11/16/2015 at Unknown time  . sertraline (ZOLOFT) 50 MG tablet Take 50 mg by mouth daily.   Past Month at Unknown time  . medroxyPROGESTERone (DEPO-PROVERA) 150 MG/ML injection Inject 1 mL (150 mg total) into the muscle once. 1 mL 2   . metroNIDAZOLE (METROGEL VAGINAL) 0.75 % vaginal gel Place 1 Applicatorful vaginally at bedtime. Insert one applicator, at bedtime, for 5 nights. (Patient not taking: Reported on 09/22/2015) 70 g 0   . ondansetron (ZOFRAN) 4 MG tablet Take 1 tablet (4 mg total) by mouth every 4 (four) hours as needed for nausea. 30 tablet 5 More than a month at Unknown time  . terconazole (TERAZOL 3) 80 MG vaginal suppository Place 1 suppository vaginally daily. Reported on 10/25/2015  0 Completed Course at Unknown time    ROS  See HPI Above Physical Exam   Blood pressure 105/78, pulse 61, temperature 98.3 F (36.8 C), temperature source Oral, resp. rate 20, weight 66.679 kg (147 lb), last menstrual period 10/26/2015, not currently breastfeeding.   Results for orders placed or performed during the hospital encounter of 11/17/15 (from the past 24 hour(s))  Wet prep, genital     Status: Abnormal   Collection Time: 11/17/15  2:54 AM  Result Value Ref Range   Yeast Wet Prep HPF POC NONE SEEN NONE SEEN   Trich, Wet Prep NONE SEEN NONE SEEN   Clue Cells Wet Prep HPF POC NONE SEEN NONE SEEN   WBC, Wet Prep HPF POC FEW (A) NONE SEEN   Sperm NONE SEEN     Physical Exam  Constitutional: She is oriented to person, place, and time. She appears well-developed and well-nourished.  HENT:  Head: Normocephalic and atraumatic.  Eyes: Conjunctivae are normal.  Neck: Normal range of motion.  Cardiovascular: Normal rate.   Respiratory: Effort normal.  GI: Soft.  Genitourinary: There is no rash, tenderness  or lesion on the right labia. There is no rash, tenderness or lesion on the left labia. Cervix exhibits no discharge. There is bleeding in the vagina. No vaginal discharge found.  Sterile Speculum Exam: -Vaginal Vault: Scant amt blood.  No discharge-wet prep collected prior to speculum exam -Cervix:Appears normal and closed from os-GC/CT collected -Bimanual Exam: Deferred  Musculoskeletal: Normal range of motion.  Neurological: She is alert and oriented to person, place, and time.  Skin: Skin is warm and dry.    ED Course  Assessment: 24y.o. Female Vaginal Pruritis Vaginal Discharge  Plan: -Wet prep Negative -GC/CT Pending -Discussed possibility of lichen sclerosus, but informed unlikely but possible -Rx for triamcinolone and nystatin sent to pharmacy -Instructed on usage -Encouraged to use as needed for next 4 days and contact office if persists  or worsens -Encouraged to call if any questions or concerns arise prior to next scheduled office visit.  -Discharged to home in stable condition  Cherre Robins CNM, MSN 11/17/2015 3:39 AM

## 2015-11-18 LAB — GC/CHLAMYDIA PROBE AMP (~~LOC~~) NOT AT ARMC
Chlamydia: NEGATIVE
NEISSERIA GONORRHEA: NEGATIVE

## 2016-10-01 ENCOUNTER — Encounter (HOSPITAL_COMMUNITY): Payer: Self-pay | Admitting: Emergency Medicine

## 2016-10-01 ENCOUNTER — Emergency Department (HOSPITAL_COMMUNITY)
Admission: EM | Admit: 2016-10-01 | Discharge: 2016-10-01 | Disposition: A | Discharge status: Home / Self Care | Payer: Medicaid Other | Attending: Emergency Medicine | Admitting: Emergency Medicine

## 2016-10-01 DIAGNOSIS — J45909 Unspecified asthma, uncomplicated: Secondary | ICD-10-CM | POA: Diagnosis not present

## 2016-10-01 DIAGNOSIS — R21 Rash and other nonspecific skin eruption: Secondary | ICD-10-CM | POA: Diagnosis present

## 2016-10-01 DIAGNOSIS — Z79899 Other long term (current) drug therapy: Secondary | ICD-10-CM | POA: Diagnosis not present

## 2016-10-01 DIAGNOSIS — Z87891 Personal history of nicotine dependence: Secondary | ICD-10-CM | POA: Insufficient documentation

## 2016-10-01 MED ORDER — TRIAMCINOLONE ACETONIDE 0.1 % EX CREA: 1.0000 "application " | TOPICAL_CREAM | Freq: Two times a day (BID) | CUTANEOUS | 0 refills | Status: DC

## 2016-10-01 NOTE — ED Triage Notes (Signed)
Pt. Stated, I started having this rash underneath my breast, tried a new lotion and that's probably what it is.  It started 2 days ago.

## 2016-10-01 NOTE — ED Provider Notes (Signed)
[facility]Y DEPT Provider Note   CSN: [id number] Arrival date & time: 10/01/16  1113  By signing my name below, I, Linna Darner, attest that this documentation has been prepared under the direction and in the presence of [employer], PA-C. Electronically Signed: Linna Darner, Scribe. 10/01/2016. 11:50 AM.  History   Chief Complaint Chief Complaint  Patient presents with  . Allergic Reaction  . Rash    The history is provided by the patient. No language interpreter was used.     HPI Comments: Theresa [name] is a 26 y.o. female who presents to the Emergency Department complaining of a constant, gradually improving, pruritic rash underneath her left breast beginning 2-3 days ago. She states she has been scratching the area frequently. Patient has tried Benadryl with no improvement of her rash. She notes she recently used a new body lotion, but has stopped using it since onset of her rash. No new detergents or soaps. NKDA. Pt denies abdominal pain, SOB, trouble swallowing, fever, chills, or any other associated symptoms.  Past Medical History:  Diagnosis Date  . Achalasia   . Anxiety   . Asthma   . Chlamydia age 35  . Depression   . GERD (gastroesophageal reflux disease)   . Left fibular fracture 12/2011   referred by ED to outpt ortho. non-surgical mgt.   . Obesity (BMI 30-39.9) 07/2013    Patient Active Problem List   Diagnosis Date Noted  . Achalasia 03/24/2015  . 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 Status post repeat low transverse cesarean section 03/23/2015  . Cesarean delivery delivered 03/23/2015  . H/O: depression 11/24/2014  . H/O: C-section - 2010 - NRFHRT 11/23/2014  . Breast lump 11/23/2014  . Infection due to enterococcus - at [facility] - treated 11/23/2014  . Vomiting 08/06/2013  . Tobacco abuse 08/06/2013  . 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  BOTOX INJECTION N/A 01/26/2014   Procedure: BOTOX INJECTION;  Surgeon: Hart Carwin, MD;  Location: [facility];  Service: Endoscopy;  Laterality: N/A;  . CESAREAN SECTION  12/2008  . CESAREAN SECTION N/A 03/23/2015   Procedure: CESAREAN SECTION;  Surgeon: Silverio Lay, MD;  Location: [facility];  Service: Obstetrics;  Laterality: N/A;  . ESOPHAGEAL MANOMETRY N/A 08/18/2013   Procedure: ESOPHAGEAL MANOMETRY (EM);  Surgeon: Hart Carwin, MD;  Location: [facility];  Service: Endoscopy;  Laterality: N/A;  . ESOPHAGOGASTRODUODENOSCOPY N/A 01/26/2014   Procedure: ESOPHAGOGASTRODUODENOSCOPY (EGD);  Surgeon: Hart Carwin, MD;  Location: [facility];  Service: Endoscopy;  Laterality: N/A;  . ESOPHAGOGASTRODUODENOSCOPY N/A 03/25/2015   Procedure:  EGD with Botox;  Surgeon: [name], MD;  Location: [facility];  Service: Endoscopy;  Laterality: N/A;  . ESOPHAGOGASTRODUODENOSCOPY N/A 11/01/2015   Procedure: ESOPHAGOGASTRODUODENOSCOPY (EGD);  Surgeon: Avel Peace, MD;  Location: [facility];  Service: General;  Laterality: N/A;  . ESOPHAGOGASTRODUODENOSCOPY (EGD) WITH ESOPHAGEAL DILATION N/A 08/06/2013   Procedure: ESOPHAGOGASTRODUODENOSCOPY (EGD) WITH ESOPHAGEAL DILATION;  Surgeon: Hart Carwin, MD;  Location: [facility] ENDOSCOPY;  Service: Endoscopy;  Laterality: N/A;  . HELLER MYOTOMY N/A 11/01/2015   Procedure: LAPAROSCOPIC HELLER MYOTOMY AND DOR FUNDOPLICATION;  Surgeon: Avel Peace, MD;  Location: [facility];  Service: General;  Laterality: N/A;  . TONSILLECTOMY  07/2007   Dr Narda Bonds    OB History    Rhett B[place] Para Term Preterm AB Living   3 2 2   1  2  SAB TAB Ectopic Multiple Live Births     1   0 2       Home Medications    Prior to Admission medications   Medication Sig Start Date End Date Taking? Authorizing Provider  methocarbamol (ROBAXIN) 500 MG tablet Take 1 tablet (500 mg total) by mouth every 6 (six) hours as needed for muscle spasms. 11/05/15   Avel Peace, MD  nystatin cream  (MYCOSTATIN) Apply 1 application topically 2 (two) times daily. 11/17/15   Gerrit Heck, CNM  sertraline (ZOLOFT) 50 MG tablet Take 50 mg by mouth daily.    Historical Provider, MD  triamcinolone cream (KENALOG) 0.1 % Apply 1 application topically 2 (two) times daily. 10/01/16   Joycie Peek, PA-C    Family History Family History  Problem Relation Age of Onset  . Hypertension Other   . Colon cancer Neg Hx   . Esophageal cancer Neg Hx   . Rectal cancer Neg Hx   . Stomach cancer Neg Hx     Social History Social History  Substance Use Topics  . Smoking status: Former Smoker    Packs/day: 0.25    Types: Cigarettes    Quit date: 10/24/2014  . Smokeless tobacco: Never Used  . Alcohol use Yes     Comment: occassionally     Allergies   Patient has no known allergies.   Review of Systems Review of Systems  A complete review of systems was obtained and all systems are negative except as noted in the HPI and PMH.   Physical Exam Updated Vital Signs BP (!) 107/52 (BP Location: Left Arm)   Pulse 76   Temp 98.3 F (36.8 C) (Oral)   Resp 20   Ht 5\' 4"  (1.626 m)   LMP 09/22/2016   SpO2 100%   Physical Exam  Constitutional: She is oriented to person, place, and time. She appears well-developed and well-nourished. No distress.  HENT:  Head: Normocephalic and atraumatic.  Mouth/Throat: Oropharynx is clear and moist.  Eyes: Conjunctivae and EOM are normal.  Neck: Neck supple. No tracheal deviation present.  Cardiovascular: Normal rate, regular rhythm and normal heart sounds.   Pulmonary/Chest: Effort normal and breath sounds normal. No respiratory distress.  Chaperone present for exam. Mild hyperpigmentation under her left breast. Minimal excoriations.  Musculoskeletal: Normal range of motion.  Neurological: She is alert and oriented to person, place, and time.  Skin: Skin is warm and dry.  Psychiatric: She has a normal mood and affect. Her behavior is normal.  Nursing note  and vitals reviewed.   ED Treatments / Results  Labs (all labs ordered are listed, but only abnormal results are displayed) Labs Reviewed - No data to display  EKG  EKG Interpretation None       Radiology No results found.  Procedures Procedures (including critical care time)  DIAGNOSTIC STUDIES: Oxygen Saturation is 100% on RA, normal by my interpretation.    COORDINATION OF CARE: 11:53 AM Discussed treatment plan with pt at bedside and pt agreed to plan.  Medications Ordered in ED Medications - No data to display   Initial Impression / Assessment and Plan / ED Course  I have reviewed the triage vital signs and the nursing notes.  Pertinent labs & imaging results that were available during my care of the patient were reviewed by me and considered in my medical decision making (see chart for details).     Suspect some degree of contact dermatitis as symptoms are improving after  cessation of trigger. Discussed Benadryl, Zantac, combination as well as steroid cream. Continue supportive care at home. Follow-up with PCP or dermatologist if no improvement. Return precautions discussed.  Final Clinical Impressions(s) / ED Diagnoses   Final diagnoses:  Rash    New Prescriptions New Prescriptions   TRIAMCINOLONE CREAM (KENALOG) 0.1 %    Apply 1 application topically 2 (two) times daily.  I personally performed the services described in this documentation, which was scribed in my presence. The recorded information has been reviewed and is accurate.    Joycie PeekBenjamin Nedra Mcinnis, PA-C 10/01/16 1206    Mancel BaleElliott Wentz, MD 10/01/16 [phone]

## 2016-10-01 NOTE — Discharge Instructions (Signed)
Meals a day Benadryl and Zantac together as we discussed. Use your vacations as prescribed. Discontinue use of scented lotion/body wash. Follow up with your doctor or dermatology if symptoms do not improve. Return to ED for new or worsening symptoms.

## 2016-10-19 ENCOUNTER — Inpatient Hospital Stay (HOSPITAL_COMMUNITY)
Admission: AD | Admit: 2016-10-19 | Discharge: 2016-10-19 | Discharge status: Against Medical Advice | Payer: Medicaid Other | Source: Ambulatory Visit | Attending: Obstetrics & Gynecology | Admitting: Obstetrics & Gynecology

## 2016-10-19 DIAGNOSIS — N898 Other specified noninflammatory disorders of vagina: Secondary | ICD-10-CM | POA: Diagnosis present

## 2016-10-19 DIAGNOSIS — Z5321 Procedure and treatment not carried out due to patient leaving prior to being seen by health care provider: Secondary | ICD-10-CM | POA: Insufficient documentation

## 2016-10-19 LAB — URINALYSIS, ROUTINE W REFLEX MICROSCOPIC
Bilirubin Urine: NEGATIVE
GLUCOSE, UA: NEGATIVE mg/dL
HGB URINE DIPSTICK: NEGATIVE
Ketones, ur: NEGATIVE mg/dL
LEUKOCYTES UA: NEGATIVE
Nitrite: NEGATIVE
PROTEIN: NEGATIVE mg/dL
Specific Gravity, Urine: 1.019 (ref 1.005–1.030)
pH: 7 (ref 5.0–8.0)

## 2016-10-19 LAB — POCT PREGNANCY, URINE: Preg Test, Ur: NEGATIVE

## 2016-10-19 NOTE — Progress Notes (Signed)
Pt called to be put into room for eval.  Pt not in lobby @ this time. Called x1, will attempt again.

## 2016-10-19 NOTE — Progress Notes (Signed)
Pt called a 2nd time to be placed in room & evaluated.  Still not in waiting room.  Will attempt again shortly.

## 2016-10-19 NOTE — Progress Notes (Addendum)
Pt called for eval x3 without any response. Pt not sitting in lobby. Pt left facility prior to being seen.

## 2016-10-19 NOTE — MAU Note (Addendum)
Pt states she had BV 3 weeks ago, was given medication & diflucan which she finished.  Is having extreme vaginal itching & discharge, thinks she has a yeast infection, also concerned about STD's.

## 2016-10-20 ENCOUNTER — Inpatient Hospital Stay (HOSPITAL_COMMUNITY)
Admission: AD | Admit: 2016-10-20 | Discharge: 2016-10-20 | Disposition: A | Discharge status: Home / Self Care | Payer: Medicaid Other | Source: Ambulatory Visit | Attending: Obstetrics & Gynecology | Admitting: Obstetrics & Gynecology

## 2016-10-20 ENCOUNTER — Encounter (HOSPITAL_COMMUNITY): Payer: Self-pay

## 2016-10-20 DIAGNOSIS — B9689 Other specified bacterial agents as the cause of diseases classified elsewhere: Secondary | ICD-10-CM | POA: Diagnosis not present

## 2016-10-20 DIAGNOSIS — N760 Acute vaginitis: Secondary | ICD-10-CM

## 2016-10-20 DIAGNOSIS — F419 Anxiety disorder, unspecified: Secondary | ICD-10-CM | POA: Diagnosis not present

## 2016-10-20 DIAGNOSIS — N898 Other specified noninflammatory disorders of vagina: Secondary | ICD-10-CM | POA: Diagnosis present

## 2016-10-20 DIAGNOSIS — K220 Achalasia of cardia: Secondary | ICD-10-CM | POA: Diagnosis not present

## 2016-10-20 DIAGNOSIS — F329 Major depressive disorder, single episode, unspecified: Secondary | ICD-10-CM | POA: Diagnosis not present

## 2016-10-20 DIAGNOSIS — Z87891 Personal history of nicotine dependence: Secondary | ICD-10-CM | POA: Diagnosis not present

## 2016-10-20 DIAGNOSIS — K219 Gastro-esophageal reflux disease without esophagitis: Secondary | ICD-10-CM | POA: Diagnosis not present

## 2016-10-20 DIAGNOSIS — E669 Obesity, unspecified: Secondary | ICD-10-CM | POA: Insufficient documentation

## 2016-10-20 DIAGNOSIS — J45909 Unspecified asthma, uncomplicated: Secondary | ICD-10-CM | POA: Diagnosis not present

## 2016-10-20 LAB — WET PREP, GENITAL
SPERM: NONE SEEN
TRICH WET PREP: NONE SEEN
Yeast Wet Prep HPF POC: NONE SEEN

## 2016-10-20 MED ORDER — METRONIDAZOLE 500 MG PO TABS: 500.0000 mg | ORAL_TABLET | Freq: Two times a day (BID) | ORAL | 0 refills | Status: DC

## 2016-10-20 MED ORDER — METRONIDAZOLE 0.75 % VA GEL: 1.0000 | Freq: Two times a day (BID) | VAGINAL | 1 refills | Status: DC

## 2016-10-20 NOTE — MAU Provider Note (Signed)
History     CSN: [id number]  Arrival date and time: 10/20/16 0736   None     Chief Complaint  Patient presents with  . Vaginal Discharge   Vaginal Discharge  The patient's primary symptoms include genital itching, genital lesions, a genital odor, vaginal bleeding and vaginal discharge. The patient's pertinent negatives include no pelvic pain. This is a recurrent problem. The current episode started in the past 7 days. The problem occurs constantly. The problem has been gradually worsening. The patient is experiencing no pain. She is not pregnant. Associated symptoms include rash. Pertinent negatives include no abdominal pain, back pain, chills, constipation, diarrhea, dysuria, fever, frequency, hematuria, nausea, painful intercourse, sore throat, urgency or vomiting. The vaginal discharge was white and thick. There has been no bleeding. Nothing aggravates the symptoms. She has tried nothing for the symptoms. She is sexually active. No, her partner does not have an STD.    Past Medical History:  Diagnosis Date  . Achalasia   . Anxiety   . Asthma   . Chlamydia age 38  . Depression   . GERD (gastroesophageal reflux disease)   . Left fibular fracture 12/2011   referred by ED to outpt ortho. non-surgical mgt.   . Obesity (BMI 30-39.9) 07/2013    Past Surgical History:  Procedure Laterality Date  . BOTOX INJECTION N/A 01/26/2014   Procedure: BOTOX INJECTION;  Surgeon: Hart Carwin, MD;  Location: [facility];  Service: Endoscopy;  Laterality: N/A;  . CESAREAN SECTION  12/2008  . CESAREAN SECTION N/A 03/23/2015   Procedure: CESAREAN SECTION;  Surgeon: Silverio Lay, MD;  Location: [facility];  Service: Obstetrics;  Laterality: N/A;  . ESOPHAGEAL MANOMETRY N/A 08/18/2013   Procedure: ESOPHAGEAL MANOMETRY (EM);  Surgeon: Hart Carwin, MD;  Location: [facility];  Service: Endoscopy;  Laterality: N/A;  . ESOPHAGOGASTRODUODENOSCOPY N/A 01/26/2014   Procedure: ESOPHAGOGASTRODUODENOSCOPY (EGD);   Surgeon: Hart Carwin, MD;  Location: [facility];  Service: Endoscopy;  Laterality: N/A;  . ESOPHAGOGASTRODUODENOSCOPY N/A 03/25/2015   Procedure:  EGD with Botox;  Surgeon: [name], MD;  Location: [facility];  Service: Endoscopy;  Laterality: N/A;  . ESOPHAGOGASTRODUODENOSCOPY N/A 11/01/2015   Procedure: ESOPHAGOGASTRODUODENOSCOPY (EGD);  Surgeon: Avel Peace, MD;  Location: [facility];  Service: General;  Laterality: N/A;  . ESOPHAGOGASTRODUODENOSCOPY (EGD) WITH ESOPHAGEAL DILATION N/A 08/06/2013   Procedure: ESOPHAGOGASTRODUODENOSCOPY (EGD) WITH ESOPHAGEAL DILATION;  Surgeon: Hart Carwin, MD;  Location: [facility];  Service: Endoscopy;  Laterality: N/A;  . HELLER MYOTOMY N/A 11/01/2015   Procedure: LAPAROSCOPIC HELLER MYOTOMY AND DOR FUNDOPLICATION;  Surgeon: Avel Peace, MD;  Location: [facility];  Service: General;  Laterality: N/A;  . TONSILLECTOMY  07/2007   Dr Narda Bonds    Family History  Problem Relation Age of Onset  . Hypertension Other   . Colon cancer Neg Hx   . Esophageal cancer Neg Hx   . Rectal cancer Neg Hx   . Stomach cancer Neg Hx     Social History  Substance Use Topics  . Smoking status: Former Smoker    Packs/day: 0.25    Types: Cigarettes    Quit date: 10/24/2014  . Smokeless tobacco: Never Used  . Alcohol use Yes     Comment: occassionally    Allergies: No Known Allergies  Prescriptions Prior to Admission  Medication Sig Dispense Refill Last Dose  . methocarbamol (ROBAXIN) 500 MG tablet Take 1 tablet (500 mg total) by mouth every 6 (six) hours as needed for  muscle spasms. 30 tablet 1 11/16/2015 at Unknown time  . nystatin cream (MYCOSTATIN) Apply 1 application topically 2 (two) times daily. 30 g 0   . sertraline (ZOLOFT) 50 MG tablet Take 50 mg by mouth daily.   Past Month at Unknown time  . triamcinolone cream (KENALOG) 0.1 % Apply 1 application topically 2 (two) times daily. 30 g 0     Review of Systems  Constitutional: Negative for  chills and fever.  HENT: Negative for sore throat.   Gastrointestinal: Negative for abdominal pain, constipation, diarrhea, nausea and vomiting.  Genitourinary: Positive for vaginal discharge. Negative for dysuria, frequency, hematuria, pelvic pain and urgency.  Musculoskeletal: Negative for back pain.  Skin: Positive for rash.   Physical Exam   Blood pressure 109/74, pulse 63, temperature 97.7 F (36.5 C), temperature source Oral, last menstrual period 09/22/2016, not currently breastfeeding.  Physical Exam  Constitutional: She is oriented to person, place, and time. She appears well-developed and well-nourished.  HENT:  Head: Normocephalic and atraumatic.  Cardiovascular: Normal rate and intact distal pulses.   Respiratory: Effort normal. No respiratory distress.  GI: Soft. She exhibits no distension. There is no tenderness. There is no rebound and no guarding.  Genitourinary:  Genitourinary Comments: Thick white/yellow discharge, no vaginal bleeding, no cervicitis. No Cervical motion tenderness.  Neurological: She is alert and oriented to person, place, and time.  Skin: Skin is warm and dry.  Multiple ingrown hairs in the inguinal flolds  Psychiatric: She has a normal mood and affect. Her behavior is normal.    MAU Course  Procedures  MDM Patient with reccurent BV on wet prep. Gc/CT pending. Plan for metrogel as patient cannot swallow pills. Will also recommend a probiotic.   Assessment and Plan  #1: BV recurrent - will treat with metrogel. Recommend probiotic.  Ernestina Penna 10/20/2016, 8:33 AM

## 2016-10-20 NOTE — MAU Note (Signed)
Vaginal discharge with itching for a couple of weeks, was diagnosed with BV took meds.

## 2016-10-20 NOTE — Discharge Instructions (Signed)

## 2016-10-23 LAB — GC/CHLAMYDIA PROBE AMP (~~LOC~~) NOT AT ARMC
CHLAMYDIA, DNA PROBE: NEGATIVE
Neisseria Gonorrhea: NEGATIVE

## 2016-11-05 ENCOUNTER — Encounter (HOSPITAL_COMMUNITY): Payer: Self-pay | Admitting: Emergency Medicine

## 2016-11-05 ENCOUNTER — Emergency Department (HOSPITAL_COMMUNITY)
Admission: EM | Admit: 2016-11-05 | Discharge: 2016-11-05 | Disposition: A | Discharge status: Home / Self Care | Payer: Medicaid Other | Attending: Emergency Medicine | Admitting: Emergency Medicine

## 2016-11-05 DIAGNOSIS — Y9389 Activity, other specified: Secondary | ICD-10-CM | POA: Diagnosis not present

## 2016-11-05 DIAGNOSIS — J45909 Unspecified asthma, uncomplicated: Secondary | ICD-10-CM | POA: Insufficient documentation

## 2016-11-05 DIAGNOSIS — Y92410 Unspecified street and highway as the place of occurrence of the external cause: Secondary | ICD-10-CM | POA: Diagnosis not present

## 2016-11-05 DIAGNOSIS — S299XXA Unspecified injury of thorax, initial encounter: Secondary | ICD-10-CM | POA: Diagnosis present

## 2016-11-05 DIAGNOSIS — Y999 Unspecified external cause status: Secondary | ICD-10-CM | POA: Insufficient documentation

## 2016-11-05 DIAGNOSIS — Z87891 Personal history of nicotine dependence: Secondary | ICD-10-CM | POA: Diagnosis not present

## 2016-11-05 DIAGNOSIS — M7918 Myalgia, other site: Secondary | ICD-10-CM

## 2016-11-05 MED ORDER — METHOCARBAMOL 500 MG PO TABS: 500.0000 mg | ORAL_TABLET | Freq: Four times a day (QID) | ORAL | 0 refills | Status: DC | PRN
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
## 2016-11-05 NOTE — Discharge Instructions (Signed)
Take ibuprofen as directed for inflammation and pain and robaxin for muscle relaxation. Ice to areas of soreness for the next few days and then may move to heat. Expect to be sore for the next few day and follow up with primary care physician for recheck of ongoing symptoms but return to ER for emergent changing or worsening of symptoms. Continue to move frequently to ensure you do not get stiff and to help you feel better!
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
## 2016-11-13 ENCOUNTER — Other Ambulatory Visit: Payer: Self-pay | Admitting: General Surgery

## 2016-11-13 DIAGNOSIS — R109 Unspecified abdominal pain: Secondary | ICD-10-CM

## 2016-11-14 ENCOUNTER — Other Ambulatory Visit (HOSPITAL_COMMUNITY): Payer: Self-pay | Admitting: General Surgery

## 2016-11-14 ENCOUNTER — Ambulatory Visit
Admission: RE | Admit: 2016-11-14 | Discharge: 2016-11-14 | Disposition: A | Discharge status: Home / Self Care | Payer: Medicaid Other | Source: Ambulatory Visit | Attending: General Surgery | Admitting: General Surgery

## 2016-11-14 ENCOUNTER — Other Ambulatory Visit: Payer: Self-pay | Admitting: General Surgery

## 2016-11-14 ENCOUNTER — Other Ambulatory Visit: Payer: Medicaid Other

## 2016-11-14 DIAGNOSIS — R079 Chest pain, unspecified: Secondary | ICD-10-CM

## 2016-11-14 DIAGNOSIS — K220 Achalasia of cardia: Secondary | ICD-10-CM

## 2016-11-14 DIAGNOSIS — R109 Unspecified abdominal pain: Secondary | ICD-10-CM

## 2016-11-14 MED ORDER — IOPAMIDOL (ISOVUE-300) INJECTION 61%
100.0000 mL | Freq: Once | INTRAVENOUS | Status: AC | PRN
  Administered 2016-11-14: 100 mL via INTRAVENOUS

## 2016-11-17 ENCOUNTER — Ambulatory Visit (HOSPITAL_COMMUNITY)
Admission: RE | Admit: 2016-11-17 | Discharge: 2016-11-17 | Disposition: A | Discharge status: Home / Self Care | Payer: Medicaid Other | Source: Ambulatory Visit | Attending: General Surgery | Admitting: General Surgery

## 2016-11-17 ENCOUNTER — Other Ambulatory Visit (HOSPITAL_COMMUNITY): Payer: Self-pay | Admitting: General Surgery

## 2016-11-17 DIAGNOSIS — K220 Achalasia of cardia: Secondary | ICD-10-CM | POA: Diagnosis not present

## 2017-05-07 DIAGNOSIS — Z5321 Procedure and treatment not carried out due to patient leaving prior to being seen by health care provider: Secondary | ICD-10-CM | POA: Insufficient documentation

## 2017-05-07 DIAGNOSIS — R9431 Abnormal electrocardiogram [ECG] [EKG]: Secondary | ICD-10-CM | POA: Diagnosis not present

## 2017-05-07 DIAGNOSIS — R079 Chest pain, unspecified: Secondary | ICD-10-CM | POA: Diagnosis present

## 2017-05-07 LAB — BASIC METABOLIC PANEL
Anion gap: 11 (ref 5–15)
BUN: 8 mg/dL (ref 6–20)
CALCIUM: 9.3 mg/dL (ref 8.9–10.3)
CO2: 20 mmol/L — ABNORMAL LOW (ref 22–32)
CREATININE: 0.65 mg/dL (ref 0.44–1.00)
Chloride: 103 mmol/L (ref 101–111)
GFR calc non Af Amer: >60 mL/min (ref >60)
Glucose, Bld: 86 mg/dL (ref 65–99)
Potassium: 3.4 mmol/L — ABNORMAL LOW (ref 3.5–5.1)
SODIUM: 134 mmol/L — AB (ref 135–145)

## 2017-05-07 LAB — CBC
HCT: 44.3 % (ref 36.0–46.0)
Hemoglobin: 15.1 g/dL — ABNORMAL HIGH (ref 12.0–15.0)
MCH: 30.6 pg (ref 26.0–34.0)
MCHC: 34.1 g/dL (ref 30.0–36.0)
MCV: 89.9 fL (ref 78.0–100.0)
PLATELETS: 210 10*3/uL (ref 150–400)
RBC: 4.93 MIL/uL (ref 3.87–5.11)
RDW: 12.9 % (ref 11.5–15.5)
WBC: 7 10*3/uL (ref 4.0–10.5)

## 2017-05-07 LAB — I-STAT TROPONIN, ED: TROPONIN I, POC: 0 ng/mL (ref 0.00–0.08)

## 2017-05-07 LAB — I-STAT BETA HCG BLOOD, ED (MC, WL, AP ONLY): I-stat hCG, quantitative: <5 m[IU]/mL (ref <5)

## 2017-05-07 NOTE — ED Triage Notes (Signed)
Pt states she has hx of achalasia and had her stomach sowed to esophagus; pt states she feels she ruptured stitches and unable to eat or drink; pt states she has not eaten in 2 days;  Pt stats she is unable to swallow at all; pt states burning and tightness in chest at 8/10; pt a&o 4 on arrival. Pt states she has vomited about 4 times today;-Monique,RN

## 2017-05-07 NOTE — ED Notes (Signed)
Pt informed of how many people are ahead of her. Pt walked outside and havent seen her come back

## 2017-05-08 ENCOUNTER — Ambulatory Visit (HOSPITAL_COMMUNITY)
Admission: EM | Admit: 2017-05-08 | Discharge: 2017-05-08 | Disposition: A | Discharge status: Home / Self Care | Payer: Medicaid Other | Attending: Internal Medicine | Admitting: Internal Medicine

## 2017-05-08 ENCOUNTER — Encounter (HOSPITAL_COMMUNITY): Payer: Self-pay | Admitting: Emergency Medicine

## 2017-05-08 ENCOUNTER — Emergency Department (HOSPITAL_COMMUNITY): Payer: Medicaid Other

## 2017-05-08 ENCOUNTER — Encounter (HOSPITAL_COMMUNITY)
Admission: EM | Disposition: A | Discharge status: Home / Self Care | Payer: Self-pay | Source: Home / Self Care | Attending: Emergency Medicine

## 2017-05-08 ENCOUNTER — Emergency Department (HOSPITAL_COMMUNITY)
Admission: EM | Admit: 2017-05-08 | Discharge: 2017-05-08 | Disposition: A | Discharge status: Home / Self Care | Payer: Medicaid Other | Attending: Emergency Medicine | Admitting: Emergency Medicine

## 2017-05-08 DIAGNOSIS — T18128A Food in esophagus causing other injury, initial encounter: Secondary | ICD-10-CM | POA: Diagnosis not present

## 2017-05-08 DIAGNOSIS — K222 Esophageal obstruction: Secondary | ICD-10-CM | POA: Diagnosis not present

## 2017-05-08 DIAGNOSIS — R1319 Other dysphagia: Secondary | ICD-10-CM

## 2017-05-08 DIAGNOSIS — F419 Anxiety disorder, unspecified: Secondary | ICD-10-CM | POA: Diagnosis not present

## 2017-05-08 DIAGNOSIS — X58XXXA Exposure to other specified factors, initial encounter: Secondary | ICD-10-CM | POA: Diagnosis not present

## 2017-05-08 DIAGNOSIS — Z9889 Other specified postprocedural states: Secondary | ICD-10-CM | POA: Insufficient documentation

## 2017-05-08 DIAGNOSIS — J45909 Unspecified asthma, uncomplicated: Secondary | ICD-10-CM | POA: Diagnosis not present

## 2017-05-08 DIAGNOSIS — F329 Major depressive disorder, single episode, unspecified: Secondary | ICD-10-CM | POA: Diagnosis not present

## 2017-05-08 DIAGNOSIS — W44F3XA Food entering into or through a natural orifice, initial encounter: Secondary | ICD-10-CM | POA: Insufficient documentation

## 2017-05-08 DIAGNOSIS — K219 Gastro-esophageal reflux disease without esophagitis: Secondary | ICD-10-CM | POA: Insufficient documentation

## 2017-05-08 DIAGNOSIS — R748 Abnormal levels of other serum enzymes: Secondary | ICD-10-CM | POA: Diagnosis not present

## 2017-05-08 DIAGNOSIS — Z79899 Other long term (current) drug therapy: Secondary | ICD-10-CM | POA: Diagnosis not present

## 2017-05-08 DIAGNOSIS — K220 Achalasia of cardia: Secondary | ICD-10-CM | POA: Diagnosis not present

## 2017-05-08 DIAGNOSIS — Z87891 Personal history of nicotine dependence: Secondary | ICD-10-CM | POA: Diagnosis not present

## 2017-05-08 DIAGNOSIS — R112 Nausea with vomiting, unspecified: Secondary | ICD-10-CM

## 2017-05-08 DIAGNOSIS — R1013 Epigastric pain: Secondary | ICD-10-CM

## 2017-05-08 HISTORY — PX: ESOPHAGOGASTRODUODENOSCOPY: SHX5428

## 2017-05-08 HISTORY — PX: FOREIGN BODY REMOVAL: SHX962

## 2017-05-08 LAB — CBC WITH DIFFERENTIAL/PLATELET
Basophils Absolute: 0 10*3/uL (ref 0.0–0.1)
Basophils Relative: 1 %
EOS PCT: 7 %
Eosinophils Absolute: 0.4 10*3/uL (ref 0.0–0.7)
HEMATOCRIT: 41.6 % (ref 36.0–46.0)
HEMOGLOBIN: 14.3 g/dL (ref 12.0–15.0)
LYMPHS ABS: 2 10*3/uL (ref 0.7–4.0)
LYMPHS PCT: 31 %
MCH: 30.7 pg (ref 26.0–34.0)
MCHC: 34.4 g/dL (ref 30.0–36.0)
MCV: 89.3 fL (ref 78.0–100.0)
Monocytes Absolute: 0.3 10*3/uL (ref 0.1–1.0)
Monocytes Relative: 5 %
NEUTROS PCT: 56 %
Neutro Abs: 3.7 10*3/uL (ref 1.7–7.7)
Platelets: 231 10*3/uL (ref 150–400)
RBC: 4.66 MIL/uL (ref 3.87–5.11)
RDW: 12.7 % (ref 11.5–15.5)
WBC: 6.5 10*3/uL (ref 4.0–10.5)

## 2017-05-08 LAB — COMPREHENSIVE METABOLIC PANEL
ALT: 13 U/L — AB (ref 14–54)
AST: 17 U/L (ref 15–41)
Albumin: 3.9 g/dL (ref 3.5–5.0)
Alkaline Phosphatase: 86 U/L (ref 38–126)
Anion gap: 9 (ref 5–15)
BUN: 8 mg/dL (ref 6–20)
CHLORIDE: 106 mmol/L (ref 101–111)
CO2: 21 mmol/L — AB (ref 22–32)
CREATININE: 0.62 mg/dL (ref 0.44–1.00)
Calcium: 9 mg/dL (ref 8.9–10.3)
GFR calc non Af Amer: >60 mL/min (ref >60)
Glucose, Bld: 90 mg/dL (ref 65–99)
Potassium: 3.6 mmol/L (ref 3.5–5.1)
Sodium: 136 mmol/L (ref 135–145)
Total Bilirubin: 0.7 mg/dL (ref 0.3–1.2)
Total Protein: 7.4 g/dL (ref 6.5–8.1)

## 2017-05-08 LAB — LIPASE, BLOOD: Lipase: 65 U/L — ABNORMAL HIGH (ref 11–51)

## 2017-05-08 SURGERY — EGD (ESOPHAGOGASTRODUODENOSCOPY)
Anesthesia: Moderate Sedation

## 2017-05-08 MED ORDER — MORPHINE SULFATE (PF) 4 MG/ML IV SOLN: 2.0000 mg | Freq: Once | INTRAVENOUS | Status: DC

## 2017-05-08 MED ORDER — MIDAZOLAM HCL 5 MG/ML IJ SOLN
INTRAMUSCULAR | Status: AC
  Filled 2017-05-08: qty 2

## 2017-05-08 MED ORDER — SODIUM CHLORIDE 0.9 % IV SOLN
Freq: Once | INTRAVENOUS | Status: AC
  Administered 2017-05-08: 17:00:00 via INTRAVENOUS

## 2017-05-08 MED ORDER — LORAZEPAM 2 MG/ML IJ SOLN
1.0000 mg | Freq: Once | INTRAMUSCULAR | Status: AC
  Administered 2017-05-08: 1 mg via INTRAVENOUS
  Filled 2017-05-08: qty 1

## 2017-05-08 MED ORDER — SODIUM CHLORIDE 0.9 % IV BOLUS (SEPSIS)
1000.0000 mL | Freq: Once | INTRAVENOUS | Status: AC
  Administered 2017-05-08: 1000 mL via INTRAVENOUS

## 2017-05-08 MED ORDER — FENTANYL CITRATE (PF) 100 MCG/2ML IJ SOLN
INTRAMUSCULAR | Status: DC | PRN
  Administered 2017-05-08 (×3): 25 ug via INTRAVENOUS

## 2017-05-08 MED ORDER — MORPHINE SULFATE (PF) 4 MG/ML IV SOLN: 4.0000 mg | Freq: Once | INTRAVENOUS | Status: DC

## 2017-05-08 MED ORDER — ONDANSETRON HCL 4 MG/2ML IJ SOLN
4.0000 mg | Freq: Once | INTRAMUSCULAR | Status: AC
  Administered 2017-05-08: 4 mg via INTRAVENOUS
  Filled 2017-05-08: qty 2

## 2017-05-08 MED ORDER — FAMOTIDINE IN NACL 20-0.9 MG/50ML-% IV SOLN
20.0000 mg | Freq: Once | INTRAVENOUS | Status: AC
  Administered 2017-05-08: 20 mg via INTRAVENOUS
  Filled 2017-05-08: qty 50

## 2017-05-08 MED ORDER — DIPHENHYDRAMINE HCL 50 MG/ML IJ SOLN
INTRAMUSCULAR | Status: DC | PRN
  Administered 2017-05-08: 50 mg via INTRAVENOUS

## 2017-05-08 MED ORDER — BUTAMBEN-TETRACAINE-BENZOCAINE 2-2-14 % EX AERO
INHALATION_SPRAY | CUTANEOUS | Status: DC | PRN
  Administered 2017-05-08: 1 via TOPICAL

## 2017-05-08 MED ORDER — NITROGLYCERIN 0.4 MG SL SUBL: 0.4000 mg | SUBLINGUAL_TABLET | Freq: Once | SUBLINGUAL | Status: DC

## 2017-05-08 MED ORDER — DIPHENHYDRAMINE HCL 50 MG/ML IJ SOLN
INTRAMUSCULAR | Status: AC
  Filled 2017-05-08: qty 1

## 2017-05-08 MED ORDER — FENTANYL CITRATE (PF) 100 MCG/2ML IJ SOLN
INTRAMUSCULAR | Status: AC
  Filled 2017-05-08: qty 2

## 2017-05-08 MED ORDER — GLUCAGON HCL RDNA (DIAGNOSTIC) 1 MG IJ SOLR
1.0000 mg | Freq: Once | INTRAMUSCULAR | Status: AC
  Administered 2017-05-08: 1 mg via INTRAVENOUS
  Filled 2017-05-08: qty 1

## 2017-05-08 MED ORDER — MIDAZOLAM HCL 10 MG/2ML IJ SOLN
INTRAMUSCULAR | Status: DC | PRN
  Administered 2017-05-08: 2 mg via INTRAVENOUS
  Administered 2017-05-08: 1 mg via INTRAVENOUS
  Administered 2017-05-08: 2 mg via INTRAVENOUS

## 2017-05-08 NOTE — Consult Note (Signed)
Consultation  Referring Provider:     ED Primary Care Physician:  System, Provider Not In Primary Gastroenterologist:        Christella HartiganJacobs Reason for Consultation:          Impression / Plan:   Food impaction in patient s/p Heller muyotomy and fundoplication for achalasia earlier this year.  Plan EGD, remove food impaction and possibly dilate - ? Post-op stricture from GERD          HPI:   Theresa [name] is a 26 y.o. female w/ above hx - she ate a [place] wrap 2-3 d a go - has not been able to swallow right since. Unable to swallow and keep water down. Came to ED yesterday and left due to wait. Some odynophagia but no pain at rest. First time she has had problems since before surgery.  Past Medical History:  Diagnosis Date  . Achalasia   . Anxiety   . Asthma   . Chlamydia age 26  . Depression   . GERD (gastroesophageal reflux disease)   . Left fibular fracture 12/2011   referred by ED to outpt ortho. non-surgical mgt.   . Obesity (BMI 30-39.9) 07/2013    Past Surgical History:  Procedure Laterality Date  . BOTOX INJECTION N/A 01/26/2014   Procedure: BOTOX INJECTION;  Surgeon: Hart Carwinora M Brodie, MD;  Location: [facility];  Service: Endoscopy;  Laterality: N/A;  . CESAREAN SECTION  12/2008  . CESAREAN SECTION N/A 03/23/2015   Procedure: CESAREAN SECTION;  Surgeon: Silverio LaySandra Rivard, MD;  Location: [facility];  Service: Obstetrics;  Laterality: N/A;  . ESOPHAGEAL MANOMETRY N/A 08/18/2013   Procedure: ESOPHAGEAL MANOMETRY (EM);  Surgeon: Hart Carwinora M Brodie, MD;  Location: [facility];  Service: Endoscopy;  Laterality: N/A;  . ESOPHAGOGASTRODUODENOSCOPY N/A 01/26/2014   Procedure: ESOPHAGOGASTRODUODENOSCOPY (EGD);  Surgeon: Hart Carwinora M Brodie, MD;  [facility];  Service: Endoscopy;  Laterality: N/A;  . ESOPHAGOGASTRODUODENOSCOPY N/A 03/25/2015   Procedure:  EGD with Botox;  Surgeon: Rachael Feeaniel P Jacobs, MD;  [facility];  Service: Endoscopy;  Laterality: N/A;  . ESOPHAGOGASTRODUODENOSCOPY  N/A 11/01/2015   Procedure: ESOPHAGOGASTRODUODENOSCOPY (EGD);  Surgeon: Avel Peaceodd Rosenbower, MD;  Location: [facility];  Service: General;  Laterality: N/A;  . ESOPHAGOGASTRODUODENOSCOPY (EGD) WITH ESOPHAGEAL DILATION N/A 08/06/2013   Procedure: ESOPHAGOGASTRODUODENOSCOPY (EGD) WITH ESOPHAGEAL DILATION;  Surgeon: Hart Carwinora M Brodie, MD;  [facility];  Service: Endoscopy;  Laterality: N/A;  . HELLER MYOTOMY N/A 11/01/2015   Procedure: LAPAROSCOPIC HELLER MYOTOMY AND DOR FUNDOPLICATION;  Surgeon: Avel Peaceodd Rosenbower, MD;  Location: [facility];  Service: General;  Laterality: N/A;  . TONSILLECTOMY  07/2007   Dr Narda Bondshris Newman    Family History  Problem Relation Age of Onset  . Hypertension Other   . Colon cancer Neg Hx   . Esophageal cancer Neg Hx   . Rectal cancer Neg Hx   . Stomach cancer Neg Hx      Social History  Substance Use Topics  . Smoking status: Former Smoker    Packs/day: 0.25    Types: Cigarettes    Quit date: 10/24/2014  . Smokeless tobacco: Never Used  . Alcohol use Yes     Comment: occassionally    Prior to Admission medications   Not on File    Current Facility-Administered Medications  Medication Dose Route Frequency Provider Last Rate Last Dose  . 0.9 %  sodium chloride infusion   Intravenous [address], [place]      . morphine  4 MG/ML injection 2 mg  2 mg Intravenous [address], [place], [place]      . nitroGLYCERIN (NITROSTAT) SL tablet 0.4 mg  0.4 mg Sublingual [address], [place], [place]       No current outpatient prescriptions on file.    Allergies as of 05/08/2017  . (No Known Allergies)     Review of Systems:    This is positive for those things mentioned in the HPI, All other review of systems are negative.       Physical Exam:  Vital signs in last 24 hours: Temp:  [98 F (36.7 C)-98.5 F (36.9 C)] 98 F (36.7 C) (10/23 0845) Pulse Rate:  [60-98] 89 (10/23 1700) Resp:  [16] 16 (10/23 1200) BP: (95-130)/(57-82) 103/63 (10/23 1700) SpO2:   [96 %-100 %] 97 % (10/23 1700)    General:  Well-developed, well-nourished and in no acute distress Eyes:  anicteric. ENT:   Mouth and posterior pharynx free of lesions.  Lungs: Clear to auscultation bilaterally. Heart:  S1S2, no rubs, murmurs, gallops. Abdomen:  soft, non-tender,  BS+.  Neuro:  A&O x 3.  Psych:  appropriate mood and  Affect.   Data Reviewed:   LAB RESULTS:  Recent Labs  05/07/17 2001 05/08/17 1340  WBC 7.0 6.5  HGB 15.1* 14.3  HCT 44.3 41.6  PLT 210 231   BMET  Recent Labs  05/07/17 2001 05/08/17 1340  NA 134* 136  K 3.4* 3.6  CL 103 106  CO2 20* 21*  GLUCOSE 86 90  BUN 8 8  CREATININE 0.65 0.62  CALCIUM 9.3 9.0   LFT  Recent Labs  05/08/17 1340  PROT 7.4  ALBUMIN 3.9  AST 17  ALT 13*  ALKPHOS 86  BILITOT 0.7   PT/INR No results for input(s): LABPROT, INR in the last 72 hours.  STUDIES: Dg Abd Acute W/chest  Result Date: 05/08/2017 CLINICAL DATA:  Acute nausea and vomiting. EXAM: DG ABDOMEN ACUTE W/ 1V CHEST COMPARISON:  Radiographs of Nov 14, 2016. FINDINGS: There is no evidence of dilated bowel loops or free intraperitoneal air. No radiopaque calculi or other significant radiographic abnormality is seen. Heart size and mediastinal contours are within normal limits. Both lungs are clear. IMPRESSION: No evidence of bowel obstruction or ileus. No acute cardiopulmonary disease. Electronically Signed   By: Lupita Raider, M.D.   On: 05/08/2017 12:35       Thanks   LOS: 0 days   @Dameshia Seybold  Sena Slate, MD, [facility] @  05/08/2017, 5:13 PM

## 2017-05-08 NOTE — ED Notes (Signed)
Patient transported to X-ray 

## 2017-05-08 NOTE — ED Notes (Signed)
Per PA, stager medications r/t risk for decreased BP.

## 2017-05-08 NOTE — ED Triage Notes (Signed)
Pt states she has hx of achalasia and states for the last 3 days feels unable to swallow and unable to eat. Pt was here last night unable to wait to be seen, had blood work done last night.

## 2017-05-08 NOTE — ED Notes (Signed)
Pt back from radiology 

## 2017-05-08 NOTE — ED Notes (Signed)
Pt given discharge instructions, pt ripping off leads and BP cuff, pt stable to leave and mother is driving her home

## 2017-05-08 NOTE — ED Notes (Signed)
PA advises to give Nitro after Pepcid finishes, then morphine is BP is stable.

## 2017-05-08 NOTE — Discharge Instructions (Addendum)
° °  There was food lodged in the esophagus and pushed into stomach.  Things look like they should after the surgery.  Please stay on liquid diet tonight.  Our office will call you to follow-up tomorrow.  Do not drive a car or operate machinery until tomorrow after 8 AM. You have been sedated  YOU HAD AN ENDOSCOPIC PROCEDURE TODAY: Refer to the procedure report and other information in the discharge instructions given to you for any specific questions about what was found during the examination. If this information does not answer your questions, please call Dr. Marvell FullerGessner's office at [phone] to clarify.   YOU SHOULD EXPECT: Some feelings of bloating in the abdomen. Passage of more gas than usual. Walking can help get rid of the air that was put into your GI tract during the procedure and reduce the bloating. If you had a lower endoscopy (such as a colonoscopy or flexible sigmoidoscopy) you may notice spotting of blood in your stool or on the toilet paper. Some abdominal soreness may be present for a day or two, also.  DIET: Liquids only  ACTIVITY: Your care partner should take you home directly after the procedure. You should plan to take it easy, moving slowly for the rest of the day. You can resume normal activity the day after the procedure however YOU SHOULD NOT DRIVE, use power tools, machinery or perform tasks that involve climbing or major physical exertion for 24 hours (because of the sedation medicines used during the test).   SYMPTOMS TO REPORT IMMEDIATELY: A gastroenterologist can be reached at any hour. Please call [phone]  for any of the following symptoms:   Following upper endoscopy (EGD, EUS, ERCP, esophageal dilation) Vomiting of blood or coffee ground material  New, significant abdominal pain  New, significant chest pain or pain under the shoulder blades  Painful or persistently difficult swallowing  New shortness of breath  Black, tarry-looking or red, bloody  stools  FOLLOW UP:  If any biopsies were taken you will be contacted by phone or by letter within the next 1-3 weeks. Call [phone]  if you have not heard about the biopsies in 3 weeks.  Please also call with any specific questions about appointments or follow up tests.

## 2017-05-08 NOTE — ED Notes (Signed)
ED Provider at bedside. 
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
## 2017-05-08 NOTE — ED Notes (Signed)
Pt given water, reports decrease in trouble swallowing stating that "it went down"

## 2017-05-08 NOTE — ED Notes (Signed)
Given report by previous RN, 1215 medications are not to be given at this time, PA informed and will wait until PA wants other meds given due to low BP

## 2017-05-08 NOTE — ED Notes (Signed)
Patient continuously stating she was not told about the procedure before she was given the sedation medications. This RN was in the room when the patient was given consent and signed, multiple times other RN's and PA have explained the endoscopy and follow up.

## 2017-05-08 NOTE — Brief Op Note (Signed)
05/08/2017  6:24 PM  PATIENT:  Theresa HerbertQuashay [name]  26 y.o. female  PRE-OPERATIVE DIAGNOSIS:  food impaction.  POST-OPERATIVE DIAGNOSIS:  * No post-op diagnosis entered *  PROCEDURE:  Procedure(s): ESOPHAGOGASTRODUODENOSCOPY (EGD) (N/A) FOREIGN BODY REMOVAL (N/A)  SURGEON:  Surgeon(s) and Role:    Iva Boop* Gessner, Carl E, MD - Primary   IV sedation - Benadryl 50 mg, Versed 5 mg Fentanyl 75 ug IV  Topical Cetacaine  Findings:  Moderate food impaction in mid and distal esophagus. S/P Heller myotomy and fundoplication iintact abnd appropriate appearance after pushing food into stomach.  Plan  Liquids only tonight Contact her in AM to f/u (our office) and then determine next steps - Ba swallow, ? Go ahead and do 18-20 mm dilation of GE junction?

## 2017-05-08 NOTE — Progress Notes (Signed)
Bedside report given to Leward QuanNikki Wagnor, RN.

## 2017-05-08 NOTE — ED Notes (Signed)
No answer x3

## 2017-05-09 ENCOUNTER — Encounter (HOSPITAL_COMMUNITY): Payer: Self-pay | Admitting: Internal Medicine

## 2017-05-10 NOTE — Op Note (Signed)
[facility] Patient Name: Theresa HerbertQuashay [name] Procedure Date : 05/08/2017 MRN: [id number] Attending MD: Iva Booparl E Maeci Kalbfleisch , MD Date of Birth: June 19, 1991 CSN: [id number] Age: 26 Admit Type: Emergency Department Procedure:                Upper GI endoscopy Indications:              Dysphagia, Foreign body in the esophagus - unable                            to keep food down after eating Mal[place]urkey wrap 2-3 d                            ago. Hx achalasia s/p myotomy and fundoplication Providers:                Iva Booparl E. Tailor Lucking, MD, Priscella MannAutumn Goldsmith RN, RN,                            Margo AyeValeria McKoy, Technician Referring MD:              Medicines:                Midazolam 5 mg IV, Fentanyl 50 micrograms IV,                            Diphenhydramine 50 mg IV, Cetacaine spray Complications:            No immediate complications. Estimated Blood Loss:     Estimated blood loss: none. Procedure:                Pre-Anesthesia Assessment:                           - Prior to the procedure, a History and Physical                            was performed, and patient medications and                            allergies were reviewed. The patient's tolerance of                            previous anesthesia was also reviewed. The risks                            and benefits of the procedure and the sedation                            options and risks were discussed with the patient.                            All questions were answered, and informed consent                            was obtained. Prior Anticoagulants: The patient has  taken no previous anticoagulant or antiplatelet                            agents. ASA Grade Assessment: II - A patient with                            mild systemic disease. After reviewing the risks                            and benefits, the patient was deemed in                            satisfactory condition to undergo the  procedure.                           After obtaining informed consent, the endoscope was                            passed under direct vision. Throughout the                            procedure, the patient's blood pressure, pulse, and                            oxygen saturations were monitored continuously. The                            EG-[id number] ([id number]) scope was introduced through the                            mouth, and advanced to the antrum of the stomach.                            The upper GI endoscopy was accomplished without                            difficulty. The patient tolerated the procedure                            fairly well. Scope In: Scope Out: Findings:      Food was found in the lower third of the esophagus. Removal of food was       accomplished. Estimated blood loss: none.      Evidence of a fundoplication was found in the cardia. The wrap appeared       intact.      The entire examined stomach was normal.      The cardia and gastric fundus were normal s/p fundoplication on       retroflexion. Impression:               - Food in the lower third of the esophagus. Removal                            was successful.                           -  A fundoplication was found. The wrap appears                            intact.                           - Normal stomach. Moderate Sedation:      Moderate (conscious) sedation was administered by the endoscopy nurse       and supervised by the endoscopist. The following parameters were       monitored: oxygen saturation, heart rate, blood pressure, respiratory       rate, EKG, adequacy of pulmonary ventilation, and response to care.       Total physician intraservice time was 15 minutes. Recommendation:           - Patient has a contact number available for                            emergencies. The signs and symptoms of potential                            delayed complications were discussed with the                             patient. Return to normal activities tomorrow.                            Written discharge instructions were provided to the                            patient.                           - Full liquid diet.                           - Continue present medications.                           - My office will contact her to arrange EGD and                            balloon dilation of GE junction up to 20 mm by Dr.                            Christella Hartigan.                           Maybe this will help with dysphagia she has had                            since lap Heller myotomy and fundoplication                           cc: Dr. Christella Hartigan and Dr. Abbey Chatters Procedure Code(s):        --- Professional ---  16109, Esophagoscopy, flexible, transoral; with                            removal of foreign body(s)                           99152, Moderate sedation services provided by the                            same physician or other qualified health care                            professional performing the diagnostic or                            therapeutic service that the sedation supports,                            requiring the presence of an independent trained                            observer to assist in the monitoring of the                            patient's level of consciousness and physiological                            status; initial 15 minutes of intraservice time,                            patient age 79 years or older Diagnosis Code(s):        --- Professional ---                           [phone], Food in esophagus causing other injury,                            initial encounter                           Z98.890, Other specified postprocedural states                           R13.10, Dysphagia, unspecified                           T18.108A, Unspecified foreign body in esophagus                            causing other  injury, initial encounter CPT copyright 2016 [employer]. All rights reserved. The codes documented in this report are preliminary and upon coder review may  be revised to meet current compliance requirements. Iva Boop, MD 05/10/2017 2:34:05 PM This report has been signed electronically. Number of Addenda: 0

## 2017-05-14 ENCOUNTER — Telehealth: Payer: Self-pay

## 2017-05-14 DIAGNOSIS — R1310 Dysphagia, unspecified: Secondary | ICD-10-CM

## 2017-05-14 NOTE — Telephone Encounter (Signed)
Left message on machine to call back  

## 2017-05-14 NOTE — Telephone Encounter (Signed)
-----   Message from Rachael Feeaniel P Jacobs, MD sent at 05/08/2017  9:32 PM EDT ----- Regarding: RE: needs f/u Lolita Cramarl, Thanks   Askari Kinley, She needs EGD with balloon dilation.  Within 1-2 weeks.  [facility] no [facility] available in that timeframe.  For dysphagia, recent food impaction.  Thanks  ----- Message ----- From: Iva BoopGessner, Carl E, MD Sent: 05/08/2017   6:29 PM To: Rachael Feeaniel P Jacobs, MD Subject: needs f/u                                      Food impaction removed  Things look ok  ? Needs a dilation of GE junction???  Tablet delayed at GE jnct on UGI in May   Told her we would call her to set up f/u  Full report not in yet  Baldo Asharl

## 2017-05-15 NOTE — Telephone Encounter (Signed)
EGD scheduled, pt instructed and medications reviewed.  Patient instructions mailed to home.  Patient to call with any questions or concerns.  

## 2017-05-16 ENCOUNTER — Encounter: Payer: Self-pay | Admitting: Gastroenterology

## 2017-05-16 ENCOUNTER — Ambulatory Visit (AMBULATORY_SURGERY_CENTER): Payer: Medicaid Other | Admitting: Gastroenterology

## 2017-05-16 VITALS — BP 117/75 | HR 71 | Temp 97.7°F | Resp 10 | Ht 65.0 in | Wt 180.0 lb

## 2017-05-16 DIAGNOSIS — R1319 Other dysphagia: Secondary | ICD-10-CM | POA: Diagnosis not present

## 2017-05-16 DIAGNOSIS — K222 Esophageal obstruction: Secondary | ICD-10-CM

## 2017-05-16 MED ORDER — SODIUM CHLORIDE 0.9 % IV SOLN: 500.0000 mL | INTRAVENOUS | Status: DC

## 2017-05-16 MED ORDER — OMEPRAZOLE 40 MG PO CPDR: DELAYED_RELEASE_CAPSULE | ORAL | 5 refills | Status: DC

## 2017-05-16 NOTE — Progress Notes (Signed)
Report given to PACU, vss 

## 2017-05-16 NOTE — Progress Notes (Signed)
Called to room to assist during endoscopic procedure.  Patient ID and intended procedure confirmed with present staff. Received instructions for my participation in the procedure from the performing physician.  

## 2017-05-16 NOTE — Op Note (Signed)
[facility] Patient Name: Bard HerbertQuashay Peavy Procedure Date: 05/16/2017 8:12 AM MRN: [id number] Endoscopist: Rachael Feeaniel P Jacobs , MD Age: [age] Referring MD:  Date of Birth: 29-Jan-1991 Gender: Female Account #: [id number] Procedure:                Upper GI endoscopy Indications:              Dysphagia; achalasia s/p heller myotomy and dor                            fundoplication 10/2015; esophageal food impaction 2                            weeks ago Medicines:                Monitored Anesthesia Care Procedure:                Pre-Anesthesia Assessment:                           - Prior to the procedure, a History and Physical                            was performed, and patient medications and                            allergies were reviewed. The patient's tolerance of                            previous anesthesia was also reviewed. The risks                            and benefits of the procedure and the sedation                            options and risks were discussed with the patient.                            All questions were answered, and informed consent                            was obtained. Prior Anticoagulants: The patient has                            taken no previous anticoagulant or antiplatelet                            agents. ASA Grade Assessment: II - A patient with                            mild systemic disease. After reviewing the risks                            and benefits, the patient was deemed in  satisfactory condition to undergo the procedure.                           After obtaining informed consent, the endoscope was                            passed under direct vision. Throughout the                            procedure, the patient's blood pressure, pulse, and                            oxygen saturations were monitored continuously. The                            Endoscope was introduced through the mouth,  and                            advanced to the second part of duodenum. The upper                            GI endoscopy was accomplished without difficulty.                            The patient tolerated the procedure well. Scope In: Scope Out: Findings:                 The body of the esophagus was dilated. The GE                            junction was a bit tortuous and only very slightly                            narrowed, the previous fundoplication was evident                            and intact. Dilation with an 18-19-20 mm balloon                            dilator was performed to 20 mm.                           The exam was otherwise without abnormality. Complications:            No immediate complications. Estimated blood loss:                            None. Estimated Blood Loss:     Estimated blood loss: none. Impression:               - Previous fundoplication was intact, the GE                            junction was very slightly narrowed and a bit  tortuous. Dilation of the GE junction was performed                            to 20mm with TTS balloon.                           - The examination was otherwise normal.                           - No specimens collected. Recommendation:           - Patient has a contact number available for                            emergencies. The signs and symptoms of potential                            delayed complications were discussed with the                            patient. Return to normal activities tomorrow.                            Written discharge instructions were provided to the                            patient.                           - Resume previous diet. Chew your food well, eat                            slowly and take small bites.                           - Continue present medications. Please start                            omeprazole 40mg  once daily, disp 30, 5  refills (one                            pill shortly before breakfast every morning).                           - Dr. Christella Hartigan' office to contact you about follow up                            office visit in 6-7 weeks. [name], MD 05/16/2017 9:05:43 AM This report has been signed electronically.

## 2017-05-16 NOTE — Patient Instructions (Signed)
YOU HAD AN ENDOSCOPIC PROCEDURE TODAY AT THE [facility]:   Refer to the procedure report that was given to you for any specific questions about what was found during the examination.  If the procedure report does not answer your questions, please call your gastroenterologist to clarify.  If you requested that your care partner not be given the details of your procedure findings, then the procedure report has been included in a sealed envelope for you to review at your convenience later.  YOU SHOULD EXPECT: Some feelings of bloating in the abdomen. Passage of more gas than usual.  Walking can help get rid of the air that was put into your GI tract during the procedure and reduce the bloating. If you had a lower endoscopy (such as a colonoscopy or flexible sigmoidoscopy) you may notice spotting of blood in your stool or on the toilet paper. If you underwent a bowel prep for your procedure, you may not have a normal bowel movement for a few days.  Please Note:  You might notice some irritation and congestion in your nose or some drainage.  This is from the oxygen used during your procedure.  There is no need for concern and it should clear up in a day or so.  SYMPTOMS TO REPORT IMMEDIATELY:     Following upper endoscopy (EGD)  Vomiting of blood or coffee ground material  New chest pain or pain under the shoulder blades  Painful or persistently difficult swallowing  New shortness of breath  Fever of 100F or higher  Black, tarry-looking stools  For urgent or emergent issues, a gastroenterologist can be reached at any hour by calling [phone].   DIET:  Follow Dilation Handout.   ACTIVITY:  You should plan to take it easy for the rest of today and you should NOT DRIVE or use heavy machinery until tomorrow (because of the sedation medicines used during the test).    FOLLOW UP: Our staff will call the number listed on your records the next business day following your procedure  to check on you and address any questions or concerns that you may have regarding the information given to you following your procedure. If we do not reach you, we will leave a message.  However, if you are feeling well and you are not experiencing any problems, there is no need to return our call.  We will assume that you have returned to your regular daily activities without incident.  If any biopsies were taken you will be contacted by phone or by letter within the next 1-3 weeks.  Please call us at [phone] if you have not heard about the biopsies in 3 weeks.    SIGNATURES/CONFIDENTIALITY: You and/or your care partner have signed paperwork which will be entered into your electronic medical record.  These signatures attest to the fact that that the information above on your After Visit Summary has been reviewed and is understood.  Full responsibility of the confidentiality of this discharge information lies with you and/or your care-partner.   Resume medication. Information given on Dilation Handout.

## 2017-05-17 ENCOUNTER — Telehealth: Payer: Self-pay

## 2017-05-17 NOTE — Telephone Encounter (Signed)
  Follow up Call-  Call back number 05/16/2017  Post procedure Call Back phone  # [phone]  Permission to leave phone message Yes  Some recent data might be hidden     Patient questions:  Do you have a fever, pain , or abdominal swelling? No. Pain Score  0 *  Have you tolerated food without any problems? Yes.    Have you been able to return to your normal activities? Yes.    Do you have any questions about your discharge instructions: Diet   No. Medications  No. Follow up visit  No.  Do you have questions or concerns about your Care? No.  Actions: * If pain score is 4 or above: No action needed, pain <4.

## 2017-07-11 ENCOUNTER — Ambulatory Visit: Payer: Medicaid Other | Admitting: Gastroenterology

## 2017-07-12 ENCOUNTER — Ambulatory Visit (INDEPENDENT_AMBULATORY_CARE_PROVIDER_SITE_OTHER): Payer: Medicaid Other

## 2017-07-12 ENCOUNTER — Encounter (HOSPITAL_COMMUNITY): Payer: Self-pay | Admitting: Family Medicine

## 2017-07-12 ENCOUNTER — Ambulatory Visit (HOSPITAL_COMMUNITY)
Admission: EM | Admit: 2017-07-12 | Discharge: 2017-07-12 | Disposition: A | Discharge status: Home / Self Care | Payer: Medicaid Other | Attending: Internal Medicine | Admitting: Internal Medicine

## 2017-07-12 DIAGNOSIS — M545 Low back pain: Secondary | ICD-10-CM

## 2017-07-12 DIAGNOSIS — S39012A Strain of muscle, fascia and tendon of lower back, initial encounter: Secondary | ICD-10-CM

## 2017-07-12 DIAGNOSIS — Z3202 Encounter for pregnancy test, result negative: Secondary | ICD-10-CM

## 2017-07-12 LAB — POCT PREGNANCY, URINE: Preg Test, Ur: NEGATIVE

## 2017-07-12 MED ORDER — NAPROXEN 500 MG PO TABS
500.0000 mg | ORAL_TABLET | Freq: Two times a day (BID) | ORAL | 0 refills | Status: DC
Start: 2017-07-12 — End: 2017-08-07

## 2017-07-12 NOTE — Discharge Instructions (Signed)
Negative xray today, therefore no broken bones or misalignment.  Likely muscle strain contributing to your pain. Light and regular activity. Heat/ice application. Naproxen, twice a day, take with food. If symptoms worsen or do not improve in the next 2-3 weeks to return to be seen or to follow up with PCP.  May benefit from physical therapy in the future.

## 2017-07-12 NOTE — ED Triage Notes (Signed)
Pt here for fall Christmas Eve. Slipped in mud. Pt having mid back pain.

## 2017-07-12 NOTE — ED Provider Notes (Signed)
[facility]    CSN: [id number] Arrival date & time: 07/12/17  1143     History   Chief Complaint Chief Complaint  Patient presents with  . Fall    HPI Theresa [name] is a 26 y.o. female.   Theresa [name] presents with complaints of midline low back pain after she slipped and fell on 12/24. She landed on her buttocks onto mud, causing her upper body to fall back, she heard her back pop. Required assistance to stand, has been ambulatory. Pain immediately after fall and has worsened. Worse with laying flat or with driving for long period of time. Rates pain 8/10. Took tylenol. Does not radiate down legs or into buttocks. Denies previous injury. Denies incontinence of urine or stool, denies numbness or tingling to extremities.    ROS per HPI.       Past Medical History:  Diagnosis Date  . Achalasia   . Anxiety   . Asthma   . Chlamydia age 26  . Depression   . GERD (gastroesophageal reflux disease)   . Left fibular fracture 12/2011   referred by ED to outpt ortho. non-surgical mgt.   . Obesity (BMI 30-39.9) 07/2013    Patient Active Problem List   Diagnosis Date Noted  . Esophageal obstruction due to food impaction   . Achalasia 03/24/2015  . 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 Status post repeat low transverse cesarean section 03/23/2015  . Cesarean delivery delivered 03/23/2015  . H/O: depression 11/24/2014  . H/O: C-section - 2010 - NRFHRT 11/23/2014  . Breast lump 11/23/2014  . Infection due to enterococcus - at [facility] - treated 11/23/2014  . Vomiting 08/06/2013  . Tobacco abuse 08/06/2013  . 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 BOTOX INJECTION N/A 01/26/2014   Procedure: BOTOX INJECTION;  Surgeon: Hart Carwinora M Brodie, MD;  Location: [facility];  Service: Endoscopy;  Laterality: N/A;  . CESAREAN SECTION  12/2008  . CESAREAN SECTION N/A 03/23/2015   Procedure:  CESAREAN SECTION;  Surgeon: Silverio LaySandra Rivard, MD;  Location: [facility];  Service: Obstetrics;  Laterality: N/A;  . ESOPHAGEAL MANOMETRY N/A 08/18/2013   Procedure: ESOPHAGEAL MANOMETRY (EM);  Surgeon: Hart Carwinora M Brodie, MD;  Location: [facility];  Service: Endoscopy;  Laterality: N/A;  . ESOPHAGOGASTRODUODENOSCOPY N/A 01/26/2014   Procedure: ESOPHAGOGASTRODUODENOSCOPY (EGD);  Surgeon: Hart Carwinora M Brodie, MD;  [facility];  Service: Endoscopy;  Laterality: N/A;  . ESOPHAGOGASTRODUODENOSCOPY N/A 03/25/2015   Procedure:  EGD with Botox;  Surgeon: Rachael Feeaniel P Jacobs, MD;  [facility];  Service: Endoscopy;  Laterality: N/A;  . ESOPHAGOGASTRODUODENOSCOPY N/A 11/01/2015   Procedure: ESOPHAGOGASTRODUODENOSCOPY (EGD);  Surgeon: Avel Peaceodd Rosenbower, MD;  Location: [facility];  Service: General;  Laterality: N/A;  . ESOPHAGOGASTRODUODENOSCOPY N/A 05/08/2017   Procedure: ESOPHAGOGASTRODUODENOSCOPY (EGD);  Surgeon: Iva BoopGessner, Carl E, MD;  [facility];  Service: Endoscopy;  Laterality: N/A;  . ESOPHAGOGASTRODUODENOSCOPY (EGD) WITH ESOPHAGEAL DILATION N/A 08/06/2013   Procedure: ESOPHAGOGASTRODUODENOSCOPY (EGD) WITH ESOPHAGEAL DILATION;  Surgeon: Hart Carwinora M Brodie, MD;  [facility];  Service: Endoscopy;  Laterality: N/A;  . FOREIGN BODY REMOVAL N/A 05/08/2017   Procedure: FOREIGN BODY REMOVAL;  Surgeon: Iva BoopGessner, Carl E, MD;  [facility];  Service: Endoscopy;  Laterality: N/A;  . HELLER MYOTOMY N/A 11/01/2015   Procedure: LAPAROSCOPIC HELLER MYOTOMY AND DOR FUNDOPLICATION;  Surgeon: Avel Peaceodd Rosenbower, MD;  Location: [facility];  Service: General;  Laterality: N/A;  .  TONSILLECTOMY  07/2007   Dr Narda Bonds    OB History    Rhett Bannister Para Term Preterm AB Living   3 2 2   1 2    SAB TAB Ectopic Multiple Live Births     1   0 2       Home Medications    Prior to Admission medications   Medication Sig Start Date End Date Taking? Authorizing Provider  clonazePAM (KLONOPIN) 1 MG tablet Take 1 mg  by mouth 1 day or 1 dose. 03/20/17   [provider]  naproxen (NAPROSYN) 500 MG tablet Take 1 tablet (500 mg total) by mouth 2 (two) times daily. 07/12/17   Georgetta Haber, NP  omeprazole (PRILOSEC) 40 MG capsule Take one pill 30 minutes before breakfast every morning. 05/16/17   [name], MD    Family History Family History  Problem Relation Age of Onset  . Hypertension Other   . Colon cancer Neg Hx   . Esophageal cancer Neg Hx   . Rectal cancer Neg Hx   . Stomach cancer Neg Hx   . Colon polyps Neg Hx     Social History Social History   Tobacco Use  . Smoking status: Current Every Day Smoker    Packs/day: 0.25    Types: Cigarettes    Last attempt to quit: 10/24/2014    Years since quitting: 2.7  . Smokeless tobacco: Never Used  Substance Use Topics  . Alcohol use: Yes    Comment: occassionally  . Drug use: No     Allergies   Patient has no known allergies.   Review of Systems Review of Systems   Physical Exam Triage Vital Signs ED Triage Vitals  Enc Vitals Group     BP 07/12/17 1230 (!) 109/59     Pulse Rate 07/12/17 1230 72     Resp 07/12/17 1230 18     Temp 07/12/17 1230 98.5 F (36.9 C)     Temp src --      SpO2 07/12/17 1230 100 %     Weight --      Height --      Head Circumference --      Peak Flow --      Pain Score 07/12/17 1228 8     Pain Loc --      Pain Edu? --      Excl. in GC? --    No data found.  Updated Vital Signs BP (!) 109/59   Pulse 72   Temp 98.5 F (36.9 C)   Resp 18   LMP 06/13/2017   SpO2 100%   Visual Acuity Right Eye Distance:   Left Eye Distance:   Bilateral Distance:    Right Eye Near:   Left Eye Near:    Bilateral Near:     Physical Exam  Constitutional: She is oriented to person, place, and time. She appears well-developed and well-nourished. No distress.  Cardiovascular: Normal rate, regular rhythm and normal heart sounds.  Pulmonary/Chest: Effort normal and breath sounds normal.    Musculoskeletal:       Lumbar back: She exhibits tenderness and bony tenderness. She exhibits no swelling and no edema.       Back:  Midline tenderness on exam; worsened with bilateral hip flexion and straight leg raise; worsened with laying flat on exam table; sensation intact to lower extremities; heel and toe touch ambulation tolerable; strength equal to bilateral lower extremities  Neurological: She is  alert and oriented to person, place, and time.  Skin: Skin is warm and dry.     UC Treatments / Results  Labs (all labs ordered are listed, but only abnormal results are displayed) Labs Reviewed  POCT PREGNANCY, URINE    EKG  EKG Interpretation None       Radiology Dg Lumbar Spine Complete  Result Date: 07/12/2017 CLINICAL DATA:  Larey SeatFell and injured back 2 days ago. EXAM: LUMBAR SPINE - COMPLETE 4+ VIEW COMPARISON:  None. FINDINGS: Normal alignment of the lumbar vertebral bodies. Disc spaces and vertebral bodies are maintained. The facets are normally aligned. No pars defects. The visualized bony pelvis is intact. IMPRESSION: Normal alignment and no acute bony findings or degenerative changes. Electronically Signed   By: Rudie MeyerP.  Gallerani M.D.   On: 07/12/2017 14:25    Procedures Procedures (including critical care time)  Medications Ordered in UC Medications - No data to display   Initial Impression / Assessment and Plan / UC Course  I have reviewed the triage vital signs and the nursing notes.  Pertinent labs & imaging results that were available during my care of the patient were reviewed by me and considered in my medical decision making (see chart for details).     Negative xray today. Consistent with muscular strain. Naproxen twice a day, take with food. Light regular activity. Heat/ice application. Pillow under knees while sleeping. If symptoms worsen or do not improve in the next 2-3 weeks to return to be seen or to follow up with PCP.  Patient verbalized  understanding and agreeable to plan.  Ambulatory out of clinic without difficulty.    Final Clinical Impressions(s) / UC Diagnoses   Final diagnoses:  Back strain, initial encounter    ED Discharge Orders        Ordered    naproxen (NAPROSYN) 500 MG tablet  2 times daily     07/12/17 1438       Controlled Substance Prescriptions [place] Controlled Substance Registry consulted? Not Applicable   Georgetta HaberBurky, Kemon Devincenzi B, NP 07/12/17 1441

## 2017-08-07 ENCOUNTER — Encounter (HOSPITAL_COMMUNITY): Payer: Self-pay

## 2017-08-07 ENCOUNTER — Inpatient Hospital Stay (HOSPITAL_COMMUNITY)
Admission: AD | Admit: 2017-08-07 | Discharge: 2017-08-07 | Disposition: A | Discharge status: Home / Self Care | Payer: Medicaid Other | Source: Ambulatory Visit | Attending: Family Medicine | Admitting: Family Medicine

## 2017-08-07 DIAGNOSIS — N898 Other specified noninflammatory disorders of vagina: Secondary | ICD-10-CM | POA: Diagnosis present

## 2017-08-07 DIAGNOSIS — N760 Acute vaginitis: Secondary | ICD-10-CM

## 2017-08-07 DIAGNOSIS — Z9889 Other specified postprocedural states: Secondary | ICD-10-CM | POA: Diagnosis not present

## 2017-08-07 DIAGNOSIS — Z113 Encounter for screening for infections with a predominantly sexual mode of transmission: Secondary | ICD-10-CM | POA: Diagnosis not present

## 2017-08-07 DIAGNOSIS — F17210 Nicotine dependence, cigarettes, uncomplicated: Secondary | ICD-10-CM | POA: Diagnosis not present

## 2017-08-07 DIAGNOSIS — B9689 Other specified bacterial agents as the cause of diseases classified elsewhere: Secondary | ICD-10-CM

## 2017-08-07 LAB — URINALYSIS, ROUTINE W REFLEX MICROSCOPIC
Bilirubin Urine: NEGATIVE
Glucose, UA: NEGATIVE mg/dL
HGB URINE DIPSTICK: NEGATIVE
Ketones, ur: NEGATIVE mg/dL
LEUKOCYTES UA: NEGATIVE
Nitrite: NEGATIVE
PROTEIN: NEGATIVE mg/dL
SPECIFIC GRAVITY, URINE: 1.017 (ref 1.005–1.030)
pH: 6 (ref 5.0–8.0)

## 2017-08-07 LAB — WET PREP, GENITAL
Sperm: NONE SEEN
TRICH WET PREP: NONE SEEN
Yeast Wet Prep HPF POC: NONE SEEN

## 2017-08-07 LAB — POCT PREGNANCY, URINE: PREG TEST UR: NEGATIVE

## 2017-08-07 MED ORDER — TINIDAZOLE 500 MG PO TABS: 2.0000 g | ORAL_TABLET | Freq: Every day | ORAL | 0 refills | Status: AC

## 2017-08-07 MED ORDER — METRONIDAZOLE 500 MG PO TABS: 500.0000 mg | ORAL_TABLET | Freq: Two times a day (BID) | ORAL | 0 refills | Status: DC

## 2017-08-07 NOTE — MAU Provider Note (Signed)
History     CSN: [id number]  Arrival date and time: 08/07/17 1933   First Provider Initiated Contact with Patient 08/07/17 2058      Chief Complaint  Patient presents with  . Vaginal Discharge   27 y.o. Non-pregnant female here with vaginal discharge. Describes as thick, white, and malodorous. Some external vaginal itching and irritation. Used a new detergent recently. New sexual partner last month. Requesting full STD screen.   Past Medical History:  Diagnosis Date  . Achalasia   . Anxiety   . Asthma   . Chlamydia age 13  . Depression   . GERD (gastroesophageal reflux disease)   . Left fibular fracture 12/2011   referred by ED to outpt ortho. non-surgical mgt.   . Obesity (BMI 30-39.9) 07/2013    Past Surgical History:  Procedure Laterality Date  . BOTOX INJECTION N/A 01/26/2014   Procedure: BOTOX INJECTION;  Surgeon: Hart Carwin, MD;  Location: [facility];  Service: Endoscopy;  Laterality: N/A;  . CESAREAN SECTION  12/2008  . CESAREAN SECTION N/A 03/23/2015   Procedure: CESAREAN SECTION;  Surgeon: Silverio Lay, MD;  Location: [facility];  Service: Obstetrics;  Laterality: N/A;  . ESOPHAGEAL MANOMETRY N/A 08/18/2013   Procedure: ESOPHAGEAL MANOMETRY (EM);  Surgeon: Hart Carwin, MD;  Location: [facility];  Service: Endoscopy;  Laterality: N/A;  . ESOPHAGOGASTRODUODENOSCOPY N/A 01/26/2014   Procedure: ESOPHAGOGASTRODUODENOSCOPY (EGD);  Surgeon: Hart Carwin, MD;  Location: [facility];  Service: Endoscopy;  Laterality: N/A;  . ESOPHAGOGASTRODUODENOSCOPY N/A 03/25/2015   Procedure:  EGD with Botox;  Surgeon: [name], MD;  Location: [facility];  Service: Endoscopy;  Laterality: N/A;  . ESOPHAGOGASTRODUODENOSCOPY N/A 11/01/2015   Procedure: ESOPHAGOGASTRODUODENOSCOPY (EGD);  Surgeon: Avel Peace, MD;  Location: [facility];  Service: General;  Laterality: N/A;  . ESOPHAGOGASTRODUODENOSCOPY N/A 05/08/2017   Procedure: ESOPHAGOGASTRODUODENOSCOPY (EGD);  Surgeon: Iva Boop, MD;  Location[facility] END[facility]COPY;  Service: Endoscopy;  Laterality: N/A;  . ESOPHAGOGASTRODUODENOSCOPY (EGD) WITH ESOPHAGEAL DILATION N/A 08/06/2013   Procedure: ESOPHAGOGASTRODUODENOSCOPY (EGD) WITH ESOPHAGEAL DILATION;  Surgeon: Hart Carwin, MD;  Location: [facility] ENDOSCOPY;  Service: Endoscopy;  Laterality: N/A;  . FOREIGN BODY REMOVAL N/A 05/08/2017   Procedure: FOREIGN BODY REMOVAL;  Surgeon: Iva Boop, MD;  Location: [facility] ENDOSCOPY;  Service: Endoscopy;  Laterality: N/A;  . HELLER MYOTOMY N/A 11/01/2015   Procedure: LAPAROSCOPIC HELLER MYOTOMY AND DOR FUNDOPLICATION;  Surgeon: Avel Peace, MD;  Location: [facility];  Service: General;  Laterality: N/A;  . TONSILLECTOMY  07/2007   Dr Narda Bonds    Family History  Problem Relation Age of Onset  . Hypertension Other   . Colon cancer Neg Hx   . Esophageal cancer Neg Hx   . Rectal cancer Neg Hx   . Stomach cancer Neg Hx   . Colon polyps Neg Hx     Social History   Tobacco Use  . Smoking status: Current Every Day Smoker    Packs/day: 0.25    Types: Cigarettes    Last attempt to quit: 10/24/2014    Years since quitting: 2.7  . Smokeless tobacco: Never Used  Substance Use Topics  . Alcohol use: Yes    Comment: occassionally  . Drug use: No    Allergies: No Known Allergies  Medications Prior to Admission  Medication Sig Dispense Refill Last Dose  . clonazePAM (KLONOPIN) 1 MG tablet Take 1 mg by mouth daily.   2 08/07/2017 at Unknown time  . Cyanocobalamin (B-12  PO) Take 1 tablet by mouth daily.   08/07/2017 at Unknown time  . Desvenlafaxine Succinate ER 25 MG TB24 Take 50 mg by mouth daily as needed (anxiety).   0 Past Week at Unknown time  . omeprazole (PRILOSEC) 40 MG capsule Take one pill 30 minutes before breakfast every morning. 30 capsule 5 Past Week at Unknown time  . naproxen (NAPROSYN) 500 MG tablet Take 1 tablet (500 mg total) by mouth 2 (two) times daily. (Patient not taking: Reported on 08/07/2017) 30 tablet 0 Not  Taking at Unknown time    Review of Systems  Gastrointestinal: Negative for abdominal pain.  Genitourinary: Positive for vaginal discharge. Negative for vaginal bleeding.   Physical Exam   Blood pressure 113/68, pulse 94, temperature 98.1 F (36.7 C), temperature source Oral, resp. rate 18, height 5\' 4"  (1.626 m), weight 183 lb (83 kg), last menstrual period 07/13/2017, SpO2 100 %.  Physical Exam  Nursing note and vitals reviewed. Constitutional: She is oriented to person, place, and time. She appears well-developed and well-nourished.  HENT:  Head: Normocephalic and atraumatic.  Neck: Normal range of motion.  Cardiovascular: Normal rate.  Respiratory: Effort normal.  Genitourinary: There is no rash, tenderness, lesion or injury on the right labia. There is no rash, tenderness, lesion or injury on the left labia.  Musculoskeletal: Normal range of motion.  Neurological: She is alert and oriented to person, place, and time.  Skin: Skin is warm and dry.  Psychiatric: She has a normal mood and affect.   Results for orders placed or performed during the hospital encounter of 08/07/17 (from the past 24 hour(s))  Urinalysis, Routine w reflex microscopic     Status: None   Collection Time: 08/07/17  8:27 PM  Result Value Ref Range   Color, Urine YELLOW YELLOW   APPearance CLEAR CLEAR   Specific Gravity, Urine 1.017 1.005 - 1.030   pH 6.0 5.0 - 8.0   Glucose, UA NEGATIVE NEGATIVE mg/dL   Hgb urine dipstick NEGATIVE NEGATIVE   Bilirubin Urine NEGATIVE NEGATIVE   Ketones, ur NEGATIVE NEGATIVE mg/dL   Protein, ur NEGATIVE NEGATIVE mg/dL   Nitrite NEGATIVE NEGATIVE   Leukocytes, UA NEGATIVE NEGATIVE  Pregnancy, urine POC     Status: None   Collection Time: 08/07/17  8:36 PM  Result Value Ref Range   Preg Test, Ur NEGATIVE NEGATIVE  Wet prep, genital     Status: Abnormal   Collection Time: 08/07/17  9:15 PM  Result Value Ref Range   Yeast Wet Prep HPF POC NONE SEEN NONE SEEN    Trich, Wet Prep NONE SEEN NONE SEEN   Clue Cells Wet Prep HPF POC PRESENT (A) NONE SEEN   WBC, Wet Prep HPF POC FEW (A) NONE SEEN   Sperm NONE SEEN    MAU Course  Procedures  MDM Labs ordered and reviewed. Will treat for BV, pt requests Tindamax. Stable for discharge home.   Assessment and Plan   1. Bacterial vaginosis   2. Screen for STD (sexually transmitted disease)    Discharge home Follow up with GYN provider of choice Rx Tindamax  Allergies as of 08/07/2017   No Known Allergies     Medication List    STOP taking these medications   naproxen 500 MG tablet Commonly known as:  NAPROSYN     TAKE these medications   B-12 PO Take 1 tablet by mouth daily.   clonazePAM 1 MG tablet Commonly known as:  KLONOPIN Take 1  mg by mouth daily.   Desvenlafaxine Succinate ER 25 MG Tb24 Take 50 mg by mouth daily as needed (anxiety).   omeprazole 40 MG capsule Commonly known as:  PRILOSEC Take one pill 30 minutes before breakfast every morning.   tinidazole 500 MG tablet Commonly known as:  TINDAMAX Take 4 tablets (2,000 mg total) by mouth daily with breakfast for 2 days.      Donette Larry, CNM 08/07/2017, 9:35 PM

## 2017-08-07 NOTE — MAU Note (Signed)
Pt here with vaginal itching and discharge for about a week. Has been treated for BV before and thinks that's what is going on.

## 2017-08-07 NOTE — Discharge Instructions (Signed)

## 2017-08-08 LAB — HEPATITIS B SURFACE ANTIGEN: Hepatitis B Surface Ag: NEGATIVE

## 2017-08-08 LAB — GC/CHLAMYDIA PROBE AMP (~~LOC~~) NOT AT ARMC
CHLAMYDIA, DNA PROBE: NEGATIVE
NEISSERIA GONORRHEA: NEGATIVE

## 2017-08-08 LAB — RPR: RPR: NONREACTIVE

## 2017-08-08 LAB — HIV ANTIBODY (ROUTINE TESTING W REFLEX): HIV SCREEN 4TH GENERATION: NONREACTIVE
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
## 2018-02-27 ENCOUNTER — Other Ambulatory Visit: Payer: Self-pay

## 2018-02-27 ENCOUNTER — Encounter (HOSPITAL_COMMUNITY): Payer: Self-pay | Admitting: Emergency Medicine

## 2018-02-27 ENCOUNTER — Emergency Department (HOSPITAL_COMMUNITY)
Admission: EM | Admit: 2018-02-27 | Discharge: 2018-02-27 | Disposition: A | Discharge status: Home / Self Care | Payer: Medicaid Other | Attending: Emergency Medicine | Admitting: Emergency Medicine

## 2018-02-27 DIAGNOSIS — H1031 Unspecified acute conjunctivitis, right eye: Secondary | ICD-10-CM | POA: Insufficient documentation

## 2018-02-27 DIAGNOSIS — F17210 Nicotine dependence, cigarettes, uncomplicated: Secondary | ICD-10-CM | POA: Insufficient documentation

## 2018-02-27 DIAGNOSIS — H109 Unspecified conjunctivitis: Secondary | ICD-10-CM

## 2018-02-27 DIAGNOSIS — J45909 Unspecified asthma, uncomplicated: Secondary | ICD-10-CM | POA: Insufficient documentation

## 2018-02-27 MED ORDER — FLUORESCEIN SODIUM 1 MG OP STRP
1.0000 | ORAL_STRIP | Freq: Once | OPHTHALMIC | Status: AC
  Administered 2018-02-27: 1 via OPHTHALMIC
  Filled 2018-02-27: qty 1

## 2018-02-27 MED ORDER — POLYMYXIN B-TRIMETHOPRIM 10000-0.1 UNIT/ML-% OP SOLN
1.0000 [drp] | OPHTHALMIC | Status: DC
  Administered 2018-02-27: 1 [drp] via OPHTHALMIC
  Filled 2018-02-27: qty 10

## 2018-02-27 MED ORDER — TETRACAINE HCL 0.5 % OP SOLN
2.0000 [drp] | Freq: Once | OPHTHALMIC | Status: AC
  Administered 2018-02-27: 2 [drp] via OPHTHALMIC
  Filled 2018-02-27: qty 4

## 2018-02-27 NOTE — ED Provider Notes (Signed)
[facility] Provider Note   CSN: [id number] Arrival date & time: 02/27/18  1108     History   Chief Complaint Chief Complaint  Patient presents with  . Conjunctivitis    HPI Theresa [name] is a 27 y.o. female with a history of anxiety, depression, who presents today for evaluation of right eye drainage.  She reports that for the past few days she has had increasing drainage from her right eye along with redness.  She says that in the mornings when she wakes up her eye is crusted shut and she has to use a washcloth to soak the crust off so she can open it.  She reports that she use some cocoa butter on her eye this morning to see if that would help and that has not helped.  She denies any vision changes.  HPI  Past Medical History:  Diagnosis Date  . Achalasia   . Anxiety   . Asthma   . Chlamydia age 89  . Depression   . GERD (gastroesophageal reflux disease)   . Left fibular fracture 12/2011   referred by ED to outpt ortho. non-surgical mgt.   . Obesity (BMI 30-39.9) 07/2013    Patient Active Problem List   Diagnosis Date Noted  . Esophageal obstruction due to food impaction   . Achalasia 03/24/2015  . 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 Status post repeat low transverse cesarean section 03/23/2015  . Cesarean delivery delivered 03/23/2015  . H/O: depression 11/24/2014  . H/O: C-section - 2010 - NRFHRT 11/23/2014  . Breast lump 11/23/2014  . Infection due to enterococcus - at [facility] - treated 11/23/2014  . Vomiting 08/06/2013  . Tobacco abuse 08/06/2013  . 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 BOTOX INJECTION N/A 01/26/2014   Procedure: BOTOX INJECTION;  Surgeon: Hart Carwinora M Brodie, MD;  Location: [facility];  Service: Endoscopy;  Laterality: N/A;  . CESAREAN SECTION  12/2008  . CESAREAN SECTION N/A 03/23/2015   Procedure: CESAREAN  SECTION;  Surgeon: Silverio LaySandra Rivard, MD;  Location: [facility];  Service: Obstetrics;  Laterality: N/A;  . ESOPHAGEAL MANOMETRY N/A 08/18/2013   Procedure: ESOPHAGEAL MANOMETRY (EM);  Surgeon: Hart Carwinora M Brodie, MD;  Location: [facility];  Service: Endoscopy;  Laterality: N/A;  . ESOPHAGOGASTRODUODENOSCOPY N/A 01/26/2014   Procedure: ESOPHAGOGASTRODUODENOSCOPY (EGD);  Surgeon: Hart Carwinora M Brodie, MD;  [facility];  Service: Endoscopy;  Laterality: N/A;  . ESOPHAGOGASTRODUODENOSCOPY N/A 03/25/2015   Procedure:  EGD with Botox;  Surgeon: Rachael Feeaniel P Jacobs, MD;  [facility];  Service: Endoscopy;  Laterality: N/A;  . ESOPHAGOGASTRODUODENOSCOPY N/A 11/01/2015   Procedure: ESOPHAGOGASTRODUODENOSCOPY (EGD);  Surgeon: Avel Peaceodd Rosenbower, MD;  Location: [facility];  Service: General;  Laterality: N/A;  . ESOPHAGOGASTRODUODENOSCOPY N/A 05/08/2017   Procedure: ESOPHAGOGASTRODUODENOSCOPY (EGD);  Surgeon: Iva BoopGessner, Carl E, MD;  [facility];  Service: Endoscopy;  Laterality: N/A;  . ESOPHAGOGASTRODUODENOSCOPY (EGD) WITH ESOPHAGEAL DILATION N/A 08/06/2013   Procedure: ESOPHAGOGASTRODUODENOSCOPY (EGD) WITH ESOPHAGEAL DILATION;  Surgeon: Hart Carwinora M Brodie, MD;  [facility];  Service: Endoscopy;  Laterality: N/A;  . FOREIGN BODY REMOVAL N/A 05/08/2017   Procedure: FOREIGN BODY REMOVAL;  Surgeon: Iva BoopGessner, Carl E, MD;  [facility];  Service: Endoscopy;  Laterality: N/A;  . HELLER MYOTOMY N/A 11/01/2015   Procedure: LAPAROSCOPIC HELLER MYOTOMY AND DOR FUNDOPLICATION;  Surgeon: Avel Peaceodd Rosenbower, MD;  Location: [facility];  Service: General;  Laterality: N/A;  . TONSILLECTOMY  07/2007   Dr Narda Bondshris Newman     OB History    Gravida  3   Para  2   Term  2   Preterm      AB  1   Living  2     SAB      TAB  1   Ectopic      Multiple  0   Live Births  2            Home Medications    Prior to Admission medications   Medication Sig Start Date End Date Taking? Authorizing Provider    clonazePAM (KLONOPIN) 1 MG tablet Take 1 mg by mouth daily.  03/20/17   [provider]  Cyanocobalamin (B-12 PO) Take 1 tablet by mouth daily.    [provider]  Desvenlafaxine Succinate ER 25 MG TB24 Take 50 mg by mouth daily as needed (anxiety).  05/29/17   [provider]  omeprazole (PRILOSEC) 40 MG capsule Take one pill 30 minutes before breakfast every morning. 05/16/17   Rachael FeeJacobs, Daniel P, MD    Family History Family History  Problem Relation Age of Onset  . Hypertension Other   . Colon cancer Neg Hx   . Esophageal cancer Neg Hx   . Rectal cancer Neg Hx   . Stomach cancer Neg Hx   . Colon polyps Neg Hx     Social History Social History   Tobacco Use  . Smoking status: Current Every Day Smoker    Packs/day: 0.25    Types: Cigarettes    Last attempt to quit: 10/24/2014    Years since quitting: 3.3  . Smokeless tobacco: Never Used  Substance Use Topics  . Alcohol use: Yes    Comment: occassionally  . Drug use: No     Allergies   Patient has no known allergies.   Review of Systems Review of Systems  Constitutional: Negative for fever.  Eyes: Positive for discharge, redness and itching. Negative for photophobia and visual disturbance.  Neurological: Negative for headaches.  All other systems reviewed and are negative.    Physical Exam Updated Vital Signs BP 106/81 (BP Location: Right Arm)   Pulse 97   Temp 98.7 F (37.1 C) (Oral)   Resp 20   LMP 01/30/2018   SpO2 98%   Physical Exam  Constitutional: She appears well-developed.  HENT:  Head: Normocephalic and atraumatic.  Eyes: Pupils are equal, round, and reactive to light. EOM and lids are normal. Right eye exhibits discharge. Right eye exhibits no chemosis. Left eye exhibits no chemosis. Right conjunctiva is injected. Right conjunctiva has no hemorrhage. Left conjunctiva is not injected. Left conjunctiva has no hemorrhage.  Slit lamp exam:      The right eye shows no corneal  abrasion and no fluorescein uptake.  Neck: Normal range of motion. Neck supple.  Cardiovascular: Normal rate.  Neurological: She is alert.  Skin: Skin is warm and dry. She is not diaphoretic.  Nursing note and vitals reviewed.    ED Treatments / Results  Labs (all labs ordered are listed, but only abnormal results are displayed) Labs Reviewed - No data to display  EKG None  Radiology No results found.  Procedures Procedures (including critical care time)  Medications Ordered in ED Medications  trimethoprim-polymyxin b (POLYTRIM) ophthalmic solution 1 drop (has no administration in time range)  tetracaine (PONTOCAINE) 0.5 % ophthalmic solution 2 drop (2 drops Both Eyes Given  02/27/18 1259)  fluorescein ophthalmic strip 1 strip (1 strip Both Eyes Given 02/27/18 1300)     Initial Impression / Assessment and Plan / ED Course  I have reviewed the triage vital signs and the nursing notes.  Pertinent labs & imaging results that were available during my care of the patient were reviewed by me and considered in my medical decision making (see chart for details).     Miral Hoopes presents with symptoms consistent with bacterial conjunctivitis.  Purulent discharge exam.  No corneal abrasions, entrapment, consensual photophobia, or dendritic staining with fluorescein study.  Presentation non-concerning for iritis, corneal abrasions, or HSV.  No evidence of preseptal or orbital cellulitis.  Pt is  not a contact lens wearer.  Patient will be given polytrim ophthalmic.  Personal hygiene and frequent handwashing discussed.  Patient advised to followup with ophthalmologist for reevaluation in several days..  Patient verbalizes understanding and is agreeable with discharge.   Final Clinical Impressions(s) / ED Diagnoses   Final diagnoses:  Bacterial conjunctivitis of right eye    ED Discharge Orders    None       Norman Clay 02/27/18 1746    Jacalyn Lefevre,  MD 03/02/18 1323

## 2018-02-27 NOTE — ED Triage Notes (Signed)
Pt to ER for evaluation of right eye drainage and pruritus x2-3 days. Unsure if shes been around anyone with conjunctivitis.

## 2018-02-27 NOTE — Discharge Instructions (Addendum)
Use your eyedrops please put 1 drop in your right eye every 3 hours while awake for 7 days.  I have given you some instructions to read about pinkeye.  Please make sure that you are washing your hands, not touching your eye, and washing your pillowcases to avoid spreading pinkeye to your other eye or to others.  You may use a warm compress with clean water in the mornings to help your eye.  I have given you the information for an eye doctor.  If your symptoms do not improve in the next 4 days or they worsen or you have any other concerns please call them for an appointment.

## 2018-06-06 ENCOUNTER — Emergency Department (HOSPITAL_COMMUNITY)
Admission: EM | Admit: 2018-06-06 | Discharge: 2018-06-06 | Disposition: A | Discharge status: Home / Self Care | Payer: Medicaid Other | Attending: Emergency Medicine | Admitting: Emergency Medicine

## 2018-06-06 ENCOUNTER — Encounter (HOSPITAL_COMMUNITY): Payer: Self-pay | Admitting: Emergency Medicine

## 2018-06-06 DIAGNOSIS — Z79899 Other long term (current) drug therapy: Secondary | ICD-10-CM | POA: Insufficient documentation

## 2018-06-06 DIAGNOSIS — J069 Acute upper respiratory infection, unspecified: Secondary | ICD-10-CM

## 2018-06-06 DIAGNOSIS — F17210 Nicotine dependence, cigarettes, uncomplicated: Secondary | ICD-10-CM | POA: Insufficient documentation

## 2018-06-06 DIAGNOSIS — J45909 Unspecified asthma, uncomplicated: Secondary | ICD-10-CM | POA: Insufficient documentation

## 2018-06-06 MED ORDER — GUAIFENESIN-CODEINE 100-10 MG/5ML PO SOLN: 5.0000 mL | Freq: Four times a day (QID) | ORAL | 0 refills | Status: DC | PRN

## 2018-06-06 MED ORDER — IBUPROFEN 600 MG PO TABS: 600.0000 mg | ORAL_TABLET | Freq: Four times a day (QID) | ORAL | 0 refills | Status: DC | PRN

## 2018-06-06 MED ORDER — FLUTICASONE PROPIONATE 50 MCG/ACT NA SUSP: 1.0000 | Freq: Every day | NASAL | 0 refills | Status: DC

## 2018-06-06 NOTE — ED Provider Notes (Signed)
[name]   CSN: [id number] Arrival date & time: 06/06/18  0524     History   Chief Complaint Chief Complaint  Patient presents with  . URI    HPI [name] is a 27 y.o. female.  Patient presents with URI symptoms of congestion, [name], [name], [name] past 2 days. She is in [name] ED with her infant daughter who has similar symptoms. No vomiting or diarrhea. She has been taking tylenol and using saline nasal sprays without relief.   [name] history is provided by [name] patient. No language interpreter was used.    Past Medical History:  Diagnosis Date  . Achalasia   . Anxiety   . Asthma   . Chlamydia age 27  . Depression   . GERD (gastroesophageal reflux disease)   . Left fibular fracture 12/2011   referred by ED to outpt ortho. non-surgical mgt.   . Obesity (BMI 30-39.9) 07/2013    Patient Active Problem List   Diagnosis Date Noted  . Esophageal obstruction due to food impaction   . Achalasia 03/24/2015  . 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 Status post repeat low transverse cesarean section 03/23/2015  . Cesarean delivery delivered 03/23/2015  . H/O: depression 11/24/2014  . H/O: C-section - 2010 - NRFHRT 11/23/2014  . Breast lump 11/23/2014  . Infection due to enterococcus - at [facility] - treated 11/23/2014  . Vomiting 08/06/2013  . Tobacco abuse 08/06/2013  . 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 BOTOX INJECTION N/A 01/26/2014   Procedure: BOTOX INJECTION;  Surgeon: Hart Carwinora M Brodie, MD;  Location: [facility];  Service: Endoscopy;  Laterality: N/A;  . CESAREAN SECTION  12/2008  . CESAREAN SECTION N/A 03/23/2015   Procedure: CESAREAN SECTION;  Surgeon: Silverio LaySandra Rivard, MD;  Location: [facility];  Service: Obstetrics;  Laterality: N/A;  . ESOPHAGEAL MANOMETRY N/A 08/18/2013   Procedure:  ESOPHAGEAL MANOMETRY (EM);  Surgeon: Hart Carwinora M Brodie, MD;  [facility]: [facility];  Service: Endoscopy;  Laterality: N/A;  . ESOPHAGOGASTRODUODENOSCOPY N/A 01/26/2014   Procedure: ESOPHAGOGASTRODUODENOSCOPY (EGD);  Surgeon: Hart Carwinora M Brodie, MD;  [facility];  Service: Endoscopy;  Laterality: N/A;  . ESOPHAGOGASTRODUODENOSCOPY N/A 03/25/2015   Procedure:  EGD with Botox;  Surgeon: Rachael Feeaniel P Jacobs, MD;  [facility];  Service: Endoscopy;  Laterality: N/A;  . ESOPHAGOGASTRODUODENOSCOPY N/A 11/01/2015   Procedure: ESOPHAGOGASTRODUODENOSCOPY (EGD);  Surgeon: Avel Peaceodd Rosenbower, MD;  Location: [facility];  Service: General;  Laterality: N/A;  . ESOPHAGOGASTRODUODENOSCOPY N/A 05/08/2017   Procedure: ESOPHAGOGASTRODUODENOSCOPY (EGD);  Surgeon: Iva BoopGessner, Carl E, MD;  [facility];  Service: Endoscopy;  Laterality: N/A;  . ESOPHAGOGASTRODUODENOSCOPY (EGD) WITH ESOPHAGEAL DILATION N/A 08/06/2013   Procedure: ESOPHAGOGASTRODUODENOSCOPY (EGD) WITH ESOPHAGEAL DILATION;  Surgeon: Hart Carwinora M Brodie, MD;  [facility];  Service: Endoscopy;  Laterality: N/A;  . FOREIGN Theresa REMOVAL N/A 05/08/2017   Procedure: FOREIGN Theresa REMOVAL;  Surgeon: Iva BoopGessner, Carl E, MD;  [facility];  Service: Endoscopy;  Laterality: N/A;  . HELLER MYOTOMY N/A 11/01/2015   Procedure: LAPAROSCOPIC HELLER MYOTOMY AND DOR FUNDOPLICATION;  Surgeon: Avel Peaceodd Rosenbower, MD;  Location: [facility];  Service: General;  Laterality: N/A;  . TONSILLECTOMY  07/2007   Dr Narda Bondshris Newman     OB History    Gravida  3   Para  2   Term  2   Preterm  AB  1   Living  2     SAB      TAB  1   Ectopic      Multiple  0   Live Births  2            Home Medications    Prior to Admission medications   Medication Sig Start Date End Date Taking? Authorizing Provider  clonazePAM (KLONOPIN) 1 MG tablet Take 1 mg by mouth daily.  03/20/17   [provider]  Cyanocobalamin (B-12 PO) Take 1 tablet by mouth  daily.    [provider]  Desvenlafaxine Succinate ER 25 MG TB24 Take 50 mg by mouth daily as needed (anxiety).  05/29/17   [provider]  omeprazole (PRILOSEC) 40 MG capsule Take one pill 30 minutes before breakfast every morning. 05/16/17   [name], MD    Family History Family History  Problem Relation Age of Onset  . Hypertension Other   . Colon cancer Neg Hx   . Esophageal cancer Neg Hx   . Rectal cancer Neg Hx   . Stomach cancer Neg Hx   . Colon polyps Neg Hx     Social History Social History   Tobacco Use  . Smoking status: Current Every Day Smoker    Packs/day: 0.25    Types: Cigarettes    Last attempt to quit: 10/24/2014    Years since quitting: 3.6  . Smokeless tobacco: Never Used  Substance Use Topics  . Alcohol use: Yes    Comment: occassionally  . Drug use: No     Allergies   Patient has no known allergies.   Review of Systems Review of Systems  Constitutional: Positive for fever.  HENT: Positive for congestion, [name], sinus pressure and sinus pain. Negative for trouble swallowing.   Respiratory: Positive for [name]. Negative for shortness of breath and wheezing.   Cardiovascular: Negative.  Negative for chest pain.  Gastrointestinal: Negative.  Negative for abdominal pain and nausea.  Musculoskeletal: Negative.   Skin: Negative.   Neurological: Negative.      Physical Exam Updated Vital Signs BP 111/70 (BP Location: Right Arm)   Pulse 98   Temp 98.4 F (36.9 C) (Oral)   Resp 20   Ht 5\' 4"  (1.626 m)   SpO2 99%   BMI 31.41 kg/m   Physical Exam  Constitutional: She is oriented to person, place, and time. She appears well-developed and well-nourished.  HENT:  Head: Normocephalic.  Nose: Mucosal edema and [name] present.  Mouth/Throat: Oropharynx is clear and moist.  Eyes: Conjunctivae are normal.  Neck: Normal range of motion. Neck supple.  Cardiovascular: Normal rate and regular rhythm.  No murmur  heard. Pulmonary/Chest: Effort normal and breath sounds normal. She has no wheezes. She has no rales.  Actively coughing - dry, hacking [name]  Abdominal: Soft. Bowel sounds are normal. There is no tenderness. There is no rebound and no guarding.  Musculoskeletal: Normal range of motion.  Neurological: She is alert and oriented to person, place, and time.  Skin: Skin is warm and dry. No rash noted.  Psychiatric: She has a normal mood and affect.  Nursing note and vitals reviewed.    ED Treatments / Results  Labs (all labs ordered are listed, but only abnormal results are displayed) Labs Reviewed - No data to display  EKG None  Radiology No results found.  Procedures Procedures (including critical care time)  Medications Ordered in ED Medications - No data  to display   Initial Impression / Assessment and Plan / ED Course  I have reviewed [name] triage vital signs and [name] nursing notes.  Pertinent labs & imaging results that were available during my care of [name] patient were reviewed by me and considered in my medical decision making (see chart for details).     Patient to ED with URI symptoms, reporting fever at home of Tmax 102. Daughter with same symptoms.   VSS, afebrile, no hypoxia or tachy symptoms. She is very well appearing. Nasal drainage clear. Feel exam supports viral process requiring supportive care.   Final Clinical Impressions(s) / ED Diagnoses   Final diagnoses:  None   1. URI  ED Discharge Orders    None       Elpidio Anis, PA-C 06/06/18 3244    Dione Booze, MD 06/06/18 [phone]

## 2018-06-06 NOTE — ED Triage Notes (Signed)
  Patient came in with flu like symptoms that have been going on for two days.  Patient states she has been taking tylenol and elderberry OTC medications but she doesn't feel better.  Patient states fever has been 102 at home, and currently 98.4  Patient is A&O x 4.  VSS.

## 2018-08-12 ENCOUNTER — Emergency Department (HOSPITAL_COMMUNITY)
Admission: EM | Admit: 2018-08-12 | Discharge: 2018-08-12 | Disposition: A | Discharge status: Home / Self Care | Payer: Self-pay | Attending: Emergency Medicine | Admitting: Emergency Medicine

## 2018-08-12 DIAGNOSIS — J45909 Unspecified asthma, uncomplicated: Secondary | ICD-10-CM | POA: Insufficient documentation

## 2018-08-12 DIAGNOSIS — Y9259 Other trade areas as the place of occurrence of the external cause: Secondary | ICD-10-CM | POA: Insufficient documentation

## 2018-08-12 DIAGNOSIS — W57XXXA Bitten or stung by nonvenomous insect and other nonvenomous arthropods, initial encounter: Secondary | ICD-10-CM | POA: Insufficient documentation

## 2018-08-12 DIAGNOSIS — Y939 Activity, unspecified: Secondary | ICD-10-CM | POA: Insufficient documentation

## 2018-08-12 DIAGNOSIS — Z79899 Other long term (current) drug therapy: Secondary | ICD-10-CM | POA: Insufficient documentation

## 2018-08-12 DIAGNOSIS — Z23 Encounter for immunization: Secondary | ICD-10-CM | POA: Insufficient documentation

## 2018-08-12 DIAGNOSIS — S0096XA Insect bite (nonvenomous) of unspecified part of head, initial encounter: Secondary | ICD-10-CM | POA: Insufficient documentation

## 2018-08-12 DIAGNOSIS — Y999 Unspecified external cause status: Secondary | ICD-10-CM | POA: Insufficient documentation

## 2018-08-12 DIAGNOSIS — F17210 Nicotine dependence, cigarettes, uncomplicated: Secondary | ICD-10-CM | POA: Insufficient documentation

## 2018-08-12 MED ORDER — DOXYCYCLINE HYCLATE 100 MG PO CAPS: 100.0000 mg | ORAL_CAPSULE | Freq: Two times a day (BID) | ORAL | 0 refills | Status: DC

## 2018-08-12 MED ORDER — FLUCONAZOLE 200 MG PO TABS: 200.0000 mg | ORAL_TABLET | Freq: Every day | ORAL | 0 refills | Status: AC

## 2018-08-12 MED ORDER — TETANUS-DIPHTH-ACELL PERTUSSIS 5-2.5-18.5 LF-MCG/0.5 IM SUSP
0.5000 mL | Freq: Once | INTRAMUSCULAR | Status: AC
Start: 2018-08-12 — End: 2018-08-12
  Administered 2018-08-12: 0.5 mL via INTRAMUSCULAR
  Filled 2018-08-12: qty 0.5

## 2018-08-12 NOTE — Discharge Instructions (Signed)
Warm compresses 4x a day.  Return for rapid spreading redness, fever.  °

## 2018-08-12 NOTE — ED Triage Notes (Signed)
Pt here for possible insect bite/abscess to left side of her cheek. Pt reports staying in a hotel last weekend and thought she may have gotten bit then but swelling got worse today and it is now hard around the center. No drainage noted.

## 2018-08-12 NOTE — ED Provider Notes (Signed)
[facility]   CSN: [id number] Arrival date & time: 08/12/18  1638     History   Chief Complaint Chief Complaint  Patient presents with  . Abscess    HPI [name] is a 28 y.o. female.  28 yo F with a chief complaint of pain to the left side of her face.  The patient stated a hotel about a week ago and thought she was bit by an insect.  And since then is gotten more painful.  She denies fevers or chills.  Denies trauma to the area.  Describing a little bit of redness around it.  The history is provided by the patient.  Abscess  Associated symptoms: no fever, no headaches, no nausea and no vomiting   Illness  Severity:  Mild Onset quality:  Gradual Duration:  1 week Timing:  Constant Progression:  Worsening Chronicity:  New Associated symptoms: no chest pain, no congestion, no fever, no headaches, no myalgias, no nausea, no rhinorrhea, no shortness of breath, no vomiting and no wheezing     Past Medical History:  Diagnosis Date  . Achalasia   . Anxiety   . Asthma   . Chlamydia age 28  . Depression   . GERD (gastroesophageal reflux disease)   . Left fibular fracture 12/2011   referred by ED to outpt ortho. non-surgical mgt.   . Obesity (BMI 30-39.9) 07/2013    Patient Active Problem List   Diagnosis Date Noted  . Esophageal obstruction due to food impaction   . Achalasia 03/24/2015  . 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 Status post repeat low transverse cesarean section 03/23/2015  . Cesarean delivery delivered 03/23/2015  . H/O: depression 11/24/2014  . H/O: C-section - 2010 - NRFHRT 11/23/2014  . Breast lump 11/23/2014  . Infection due to enterococcus - at [facility] - treated 11/23/2014  . Vomiting 08/06/2013  . Tobacco abuse 08/06/2013  . 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 BOTOX INJECTION N/A  01/26/2014   Procedure: BOTOX INJECTION;  Surgeon: Hart Carwinora M Brodie, MD;  [facility];  Service: Endoscopy;  Laterality: N/A;  . CESAREAN SECTION  12/2008  . CESAREAN SECTION N/A 03/23/2015   Procedure: CESAREAN SECTION;  Surgeon: Silverio LaySandra Rivard, MD;  Location: [facility];  Service: Obstetrics;  Laterality: N/A;  . ESOPHAGEAL MANOMETRY N/A 08/18/2013   Procedure: ESOPHAGEAL MANOMETRY (EM);  Surgeon: Hart Carwinora M Brodie, MD;  [facility]: [facility];  Service: Endoscopy;  Laterality: N/A;  . ESOPHAGOGASTRODUODENOSCOPY N/A 01/26/2014   Procedure: ESOPHAGOGASTRODUODENOSCOPY (EGD);  Surgeon: Hart Carwinora M Brodie, MD;  [facility];  Service: Endoscopy;  Laterality: N/A;  . ESOPHAGOGASTRODUODENOSCOPY N/A 03/25/2015   Procedure:  EGD with Botox;  Surgeon: Rachael Feeaniel P Jacobs, MD;  [facility];  Service: Endoscopy;  Laterality: N/A;  . ESOPHAGOGASTRODUODENOSCOPY N/A 11/01/2015   Procedure: ESOPHAGOGASTRODUODENOSCOPY (EGD);  Surgeon: Avel Peaceodd Rosenbower, MD;  Location: [facility];  Service: General;  Laterality: N/A;  . ESOPHAGOGASTRODUODENOSCOPY N/A 05/08/2017   Procedure: ESOPHAGOGASTRODUODENOSCOPY (EGD);  Surgeon: Iva BoopGessner, Carl E, MD;  [facility];  Service: Endoscopy;  Laterality: N/A;  . ESOPHAGOGASTRODUODENOSCOPY (EGD) WITH ESOPHAGEAL DILATION N/A 08/06/2013   Procedure: ESOPHAGOGASTRODUODENOSCOPY (EGD) WITH ESOPHAGEAL DILATION;  Surgeon: Hart Carwinora M Brodie, MD;  [facility];  Service: Endoscopy;  Laterality: N/A;  . FOREIGN BODY REMOVAL N/A 05/08/2017   Procedure: FOREIGN BODY REMOVAL;  Surgeon: Iva BoopGessner, Carl E, MD;  Location: [facility];  Service: Endoscopy;  Laterality: N/A;  . HELLER MYOTOMY N/A 11/01/2015   Procedure: LAPAROSCOPIC HELLER MYOTOMY AND DOR FUNDOPLICATION;  Surgeon: Avel Peace, MD;  Location: [facility];  Service: General;  Laterality: N/A;  . TONSILLECTOMY  07/2007   Dr Narda Bonds     OB History    Gravida  3   Para  2   Term  2   Preterm      AB  1    Living  2     SAB      TAB  1   Ectopic      Multiple  0   Live Births  2            Home Medications    Prior to Admission medications   Medication Sig Start Date End Date Taking? Authorizing Provider  clonazePAM (KLONOPIN) 1 MG tablet Take 1 mg by mouth daily.  03/20/17   [provider]  Cyanocobalamin (B-12 PO) Take 1 tablet by mouth daily.    [provider]  Desvenlafaxine Succinate ER 25 MG TB24 Take 50 mg by mouth daily as needed (anxiety).  05/29/17   [provider]  doxycycline (VIBRAMYCIN) 100 MG capsule Take 1 capsule (100 mg total) by mouth 2 (two) times daily. 08/12/18   Melene Plan, DO  fluconazole (DIFLUCAN) 200 MG tablet Take 1 tablet (200 mg total) by mouth daily for 7 days. 08/12/18 08/19/18  Melene Plan, DO  fluticasone (FLONASE) 50 MCG/ACT nasal spray Place 1 spray into both nostrils daily. 06/06/18   Elpidio Anis, PA-C  guaiFENesin-codeine 100-10 MG/5ML syrup Take 5 mLs by mouth every 6 (six) hours as needed for cough. 06/06/18   Elpidio Anis, PA-C  ibuprofen (ADVIL,MOTRIN) 600 MG tablet Take 1 tablet (600 mg total) by mouth every 6 (six) hours as needed. 06/06/18   Elpidio Anis, PA-C  omeprazole (PRILOSEC) 40 MG capsule Take one pill 30 minutes before breakfast every morning. 05/16/17   [name], MD    Family History Family History  Problem Relation Age of Onset  . Hypertension Other   . Colon cancer Neg Hx   . Esophageal cancer Neg Hx   . Rectal cancer Neg Hx   . Stomach cancer Neg Hx   . Colon polyps Neg Hx     Social History Social History   Tobacco Use  . Smoking status: Current Every Day Smoker    Packs/day: 0.25    Types: Cigarettes    Last attempt to quit: 10/24/2014    Years since quitting: 3.8  . Smokeless tobacco: Never Used  Substance Use Topics  . Alcohol use: Yes    Comment: occassionally  . Drug use: No     Allergies   Patient has no known allergies.   Review of Systems Review of  Systems  Constitutional: Negative for chills and fever.  HENT: Negative for congestion and rhinorrhea.   Eyes: Negative for redness and visual disturbance.  Respiratory: Negative for shortness of breath and wheezing.   Cardiovascular: Negative for chest pain and palpitations.  Gastrointestinal: Negative for nausea and vomiting.  Genitourinary: Negative for dysuria and urgency.  Musculoskeletal: Negative for arthralgias and myalgias.  Skin: Positive for color change. Negative for pallor and wound.  Neurological: Negative for dizziness and headaches.     Physical Exam Updated Vital Signs BP 126/86 (BP Location: Right Arm)   Pulse 88   Temp 98.2 F (36.8 C) (Oral)   Resp  18   SpO2 93%   Physical Exam Vitals signs and nursing note reviewed.  Constitutional:      General: She is not in acute distress.    Appearance: She is well-developed. She is not diaphoretic.  HENT:     Head: Normocephalic and atraumatic.     Comments: Small indurated lesion just underneath the left eye.  Some mild ecchymosis extends to the lower portion of eye the canthus.  There is no noted drainage to the eye.  Extraocular motion is intact without pain.  No fluctuance. Eyes:     Pupils: Pupils are equal, round, and reactive to light.  Neck:     Musculoskeletal: Normal range of motion and neck supple.  Cardiovascular:     Rate and Rhythm: Normal rate and regular rhythm.     Heart sounds: No murmur. No friction rub. No gallop.   Pulmonary:     Effort: Pulmonary effort is normal.     Breath sounds: No wheezing or rales.  Abdominal:     General: There is no distension.     Palpations: Abdomen is soft.     Tenderness: There is no abdominal tenderness.  Musculoskeletal:        General: No tenderness.  Skin:    General: Skin is warm and dry.  Neurological:     Mental Status: She is alert and oriented to person, place, and time.  Psychiatric:        Behavior: Behavior normal.      ED Treatments /  Results  Labs (all labs ordered are listed, but only abnormal results are displayed) Labs Reviewed - No data to display  EKG None  Radiology No results found.  Procedures Procedures (including critical care time)  Medications Ordered in ED Medications - No data to display   Initial Impression / Assessment and Plan / ED Course  I have reviewed the triage vital signs and the nursing notes.  Pertinent labs & imaging results that were available during my care of the patient were reviewed by me and considered in my medical decision making (see chart for details).     28 yo F with a chief complaint of lesion to her face.  It sounds based on the history that it was most likely an insect bite that was itchy now is more painful and indurated.  I will treat this as it may be superinfected.  Will start on doxycycline.  Warm compresses.  6:47 PM:  I have discussed the diagnosis/risks/treatment options with the patient and believe the pt to be eligible for discharge home to follow-up with PCP. We also discussed returning to the ED immediately if new or worsening sx occur. We discussed the sx which are most concerning (e.g., sudden worsening pain, fever, inability to tolerate by mouth) that necessitate immediate return. Medications administered to the patient during their visit and any new prescriptions provided to the patient are listed below.  Medications given during this visit Medications - No data to display   The patient appears reasonably screen and/or stabilized for discharge and I doubt any other medical condition or other [facility] requiring further screening, evaluation, or treatment in the ED at this time prior to discharge.    Final Clinical Impressions(s) / ED Diagnoses   Final diagnoses:  Insect bite of head, unspecified part, initial encounter    ED Discharge Orders         Ordered    doxycycline (VIBRAMYCIN) 100 MG capsule  2 times daily  08/12/18 1834    fluconazole  (DIFLUCAN) 200 MG tablet  Daily     08/12/18 1834           Melene PlanFloyd, Ileta Ofarrell, DO 08/12/18 1847

## 2018-08-13 ENCOUNTER — Telehealth: Payer: Self-pay | Admitting: Surgery

## 2018-08-13 NOTE — Telephone Encounter (Signed)
ED CM received call from patient concerning not being able to afford her discharge medications. Patient enrolled in [facility] and letter faxed to [facility] on [address] at patient's request.

## 2019-03-10 ENCOUNTER — Encounter (HOSPITAL_COMMUNITY): Payer: Self-pay | Admitting: Emergency Medicine

## 2019-03-10 ENCOUNTER — Ambulatory Visit (HOSPITAL_COMMUNITY)
Admission: EM | Admit: 2019-03-10 | Discharge: 2019-03-10 | Disposition: A | Discharge status: Home / Self Care | Payer: Self-pay

## 2019-03-10 ENCOUNTER — Other Ambulatory Visit: Payer: Self-pay

## 2019-03-10 DIAGNOSIS — N6320 Unspecified lump in the left breast, unspecified quadrant: Secondary | ICD-10-CM

## 2019-03-10 NOTE — ED Triage Notes (Signed)
Lump in left breast noticed 2 weeks ago.  Over the two weeks, lump has felt like it is more solid with each day.    No pain

## 2019-03-10 NOTE — Discharge Instructions (Addendum)
You need to have an ultrasound or mammogram done for the mass in your left breast.  Call [facility] to schedule an appointment.

## 2019-03-10 NOTE — ED Provider Notes (Addendum)
[facility]    CSN: [id number] Arrival date & time: 03/10/19  1842      History   Chief Complaint Chief Complaint  Patient presents with  . Breast Mass    HPI Theresa [name] is a 28 y.o. female.   Patient presents with a "lump" in her left breast x 2 weeks.  She denies pain, skin changes, nipple discharge.  She denies falls or injury.  She states she called the [facility] to see about scheduling an appointment but was told she needs to be seen here first.  Patient states she does not have a primary care provider or OB/GYN.  The history is provided by the patient.    Past Medical History:  Diagnosis Date  . Achalasia   . Anxiety   . Asthma   . Chlamydia age 28  . Depression   . GERD (gastroesophageal reflux disease)   . Left fibular fracture 12/2011   referred by ED to outpt ortho. non-surgical mgt.   . Obesity (BMI 30-39.9) 07/2013    Patient Active Problem List   Diagnosis Date Noted  . Esophageal obstruction due to food impaction   . Achalasia 03/24/2015  . 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 Status post repeat low transverse cesarean section 03/23/2015  . Cesarean delivery delivered 03/23/2015  . H/O: depression 11/24/2014  . H/O: C-section - 2010 - NRFHRT 11/23/2014  . Breast lump 11/23/2014  . Infection due to enterococcus - at [facility] - treated 11/23/2014  . Vomiting 08/06/2013  . Tobacco abuse 08/06/2013  . 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 BOTOX INJECTION N/A 01/26/2014   Procedure: BOTOX INJECTION;  Surgeon: Hart Carwinora M Brodie, MD;  Location: [facility];  Service: Endoscopy;  Laterality: N/A;  . CESAREAN SECTION  12/2008  . CESAREAN SECTION N/A 03/23/2015   Procedure: CESAREAN SECTION;  Surgeon: Silverio LaySandra Rivard, MD;  Location: [facility];  Service: Obstetrics;  Laterality: N/A;  . ESOPHAGEAL MANOMETRY N/A 08/18/2013   Procedure:  ESOPHAGEAL MANOMETRY (EM);  Surgeon: Hart Carwinora M Brodie, MD;  [facility];  Service: Endoscopy;  Laterality: N/A;  . ESOPHAGOGASTRODUODENOSCOPY N/A 01/26/2014   Procedure: ESOPHAGOGASTRODUODENOSCOPY (EGD);  Surgeon: Hart Carwinora M Brodie, MD;  [facility];  Service: Endoscopy;  Laterality: N/A;  . ESOPHAGOGASTRODUODENOSCOPY N/A 03/25/2015   Procedure:  EGD with Botox;  Surgeon: Rachael Feeaniel P Jacobs, MD;  [facility];  Service: Endoscopy;  Laterality: N/A;  . ESOPHAGOGASTRODUODENOSCOPY N/A 11/01/2015   Procedure: ESOPHAGOGASTRODUODENOSCOPY (EGD);  Surgeon: Avel Peaceodd Rosenbower, MD;  [facility]: [facility];  Service: General;  Laterality: N/A;  . ESOPHAGOGASTRODUODENOSCOPY N/A 05/08/2017   Procedure: ESOPHAGOGASTRODUODENOSCOPY (EGD);  Surgeon: Iva BoopGessner, Carl E, MD;  [facility];  Service: Endoscopy;  Laterality: N/A;  . ESOPHAGOGASTRODUODENOSCOPY (EGD) WITH ESOPHAGEAL DILATION N/A 08/06/2013   Procedure: ESOPHAGOGASTRODUODENOSCOPY (EGD) WITH ESOPHAGEAL DILATION;  Surgeon: Hart Carwinora M Brodie, MD;  [facility];  Service: Endoscopy;  Laterality: N/A;  . FOREIGN BODY REMOVAL N/A 05/08/2017   Procedure: FOREIGN BODY REMOVAL;  Surgeon: Iva BoopGessner, Carl E, MD;  [facility];  Service: Endoscopy;  Laterality: N/A;  . HELLER MYOTOMY N/A 11/01/2015   Procedure: LAPAROSCOPIC HELLER MYOTOMY AND DOR FUNDOPLICATION;  Surgeon: Avel Peaceodd Rosenbower, MD;  [facility];  Service: General;  Laterality: N/A;  . TONSILLECTOMY  07/2007   Dr Narda Bondshris Newman    OB History    Gravida  3   Para  2   Term  2   Preterm      AB  1   Living  2     SAB      TAB  1   Ectopic      Multiple  0   Live Births  2            Home Medications    Prior to Admission medications   Medication Sig Start Date End Date Taking? Authorizing Provider  clonazePAM (KLONOPIN) 1 MG tablet Take 1 mg by mouth daily.  03/20/17   [provider]  Cyanocobalamin (B-12 PO) Take 1 tablet by mouth  daily.    [provider]  Desvenlafaxine Succinate ER 25 MG TB24 Take 50 mg by mouth daily as needed (anxiety).  05/29/17   [provider]  doxycycline (VIBRAMYCIN) 100 MG capsule Take 1 capsule (100 mg total) by mouth 2 (two) times daily. 08/12/18   Deno Etienne, DO  fluticasone (FLONASE) 50 MCG/ACT nasal spray Place 1 spray into both nostrils daily. 06/06/18   Charlann Lange, PA-C  guaiFENesin-codeine 100-10 MG/5ML syrup Take 5 mLs by mouth every 6 (six) hours as needed for cough. 06/06/18   Charlann Lange, PA-C  ibuprofen (ADVIL,MOTRIN) 600 MG tablet Take 1 tablet (600 mg total) by mouth every 6 (six) hours as needed. 06/06/18   Charlann Lange, PA-C  omeprazole (PRILOSEC) 40 MG capsule Take one pill 30 minutes before breakfast every morning. 05/16/17   Milus Banister, MD    Family History Family History  Problem Relation Age of Onset  . Hypertension Other   . Colon cancer Neg Hx   . Esophageal cancer Neg Hx   . Rectal cancer Neg Hx   . Stomach cancer Neg Hx   . Colon polyps Neg Hx     Social History Social History   Tobacco Use  . Smoking status: Current Every Day Smoker    Packs/day: 0.25    Types: Cigarettes    Last attempt to quit: 10/24/2014    Years since quitting: 4.3  . Smokeless tobacco: Never Used  Substance Use Topics  . Alcohol use: Yes    Comment: occassionally  . Drug use: No     Allergies   Patient has no known allergies.   Review of Systems Review of Systems  Constitutional: Negative for chills and fever.  HENT: Negative for ear pain and sore throat.   Eyes: Negative for pain and visual disturbance.  Respiratory: Negative for cough and shortness of breath.   Cardiovascular: Negative for chest pain and palpitations.  Gastrointestinal: Negative for abdominal pain and vomiting.  Genitourinary: Negative for dysuria and hematuria.  Musculoskeletal: Negative for arthralgias and back pain.  Skin: Negative for color change and rash.   Neurological: Negative for seizures and syncope.  All other systems reviewed and are negative.    Physical Exam Triage Vital Signs ED Triage Vitals  Enc Vitals Group     BP 03/10/19 1917 (!) 114/58     Pulse Rate 03/10/19 1917 87     Resp 03/10/19 1917 16     Temp 03/10/19 1917 98.5 F (36.9 C)     Temp Source 03/10/19 1917 Oral     SpO2 03/10/19 1917 98 %     Weight --      Height --      Head Circumference --      Peak Flow --      Pain Score 03/10/19 1913 0  Pain Loc --      Pain Edu? --      Excl. in GC? --    No data found.  Updated Vital Signs BP (!) 114/58 (BP Location: Left Arm)   Pulse 87   Temp 98.5 F (36.9 C) (Oral)   Resp 16   LMP 02/17/2019   SpO2 98%   Visual Acuity Right Eye Distance:   Left Eye Distance:   Bilateral Distance:    Right Eye Near:   Left Eye Near:    Bilateral Near:     Physical Exam Vitals signs and nursing note reviewed.  Constitutional:      General: She is not in acute distress.    Appearance: She is well-developed.  HENT:     Head: Normocephalic and atraumatic.  Eyes:     Conjunctiva/sclera: Conjunctivae normal.  Neck:     Musculoskeletal: Neck supple.  Cardiovascular:     Rate and Rhythm: Normal rate and regular rhythm.     Heart sounds: No murmur.  Pulmonary:     Effort: Pulmonary effort is normal. No respiratory distress.     Breath sounds: Normal breath sounds.  Chest:     Breasts:        Right: No inverted nipple, mass, nipple discharge, skin change or tenderness.        Left: Mass present. No inverted nipple, nipple discharge, skin change or tenderness.       Comments: 5 mm firm, nontender mass. Abdominal:     Palpations: Abdomen is soft.     Tenderness: There is no abdominal tenderness.  Skin:    General: Skin is warm and dry.  Neurological:     Mental Status: She is alert.      UC Treatments / Results  Labs (all labs ordered are listed, but only abnormal results are displayed) Labs  Reviewed - No data to display  EKG   Radiology No results found.  Procedures Procedures (including critical care time)  Medications Ordered in UC Medications - No data to display  Initial Impression / Assessment and Plan / UC Course  I have reviewed the triage vital signs and the nursing notes.  Pertinent labs & imaging results that were available during my care of the patient were reviewed by me and considered in my medical decision making (see chart for details).   Left breast mass.  [facility]  Patient instructed to call tomorrow morning for the soonest available appointment.     Final Clinical Impressions(s) / UC Diagnoses   Final diagnoses:  Breast mass, left     Discharge Instructions     You need to have an ultrasound or mammogram done for the mass in your left breast.  Call [facility] to schedule an appointment.          ED Prescriptions    None     Controlled Substance Prescriptions [place] Controlled Substance Registry consulted? Not Applicable   Mickie Bailate, Lakeshia Dohner H, NP 03/10/19 1945    Mickie Bailate, Jhovanny Guinta H, NP 03/10/19 1946

## 2019-03-14 ENCOUNTER — Other Ambulatory Visit: Payer: Self-pay | Admitting: Emergency Medicine

## 2019-03-14 DIAGNOSIS — N63 Unspecified lump in unspecified breast: Secondary | ICD-10-CM

## 2019-03-19 ENCOUNTER — Other Ambulatory Visit (HOSPITAL_COMMUNITY): Payer: Self-pay | Admitting: *Deleted

## 2019-03-19 DIAGNOSIS — N6320 Unspecified lump in the left breast, unspecified quadrant: Secondary | ICD-10-CM

## 2019-03-20 ENCOUNTER — Ambulatory Visit (HOSPITAL_COMMUNITY)
Admission: RE | Admit: 2019-03-20 | Discharge: 2019-03-20 | Disposition: A | Discharge status: Home / Self Care | Payer: Self-pay | Source: Ambulatory Visit | Attending: Obstetrics and Gynecology | Admitting: Obstetrics and Gynecology

## 2019-03-20 ENCOUNTER — Encounter (HOSPITAL_COMMUNITY): Payer: Self-pay

## 2019-03-20 ENCOUNTER — Other Ambulatory Visit: Payer: Self-pay

## 2019-03-20 DIAGNOSIS — Z1239 Encounter for other screening for malignant neoplasm of breast: Secondary | ICD-10-CM | POA: Insufficient documentation

## 2019-03-20 DIAGNOSIS — N6325 Unspecified lump in the left breast, overlapping quadrants: Secondary | ICD-10-CM | POA: Insufficient documentation

## 2019-03-20 NOTE — Patient Instructions (Signed)
Explained breast self awareness with Theresa [name]. Patient did not need a Pap smear today due to last Pap smear was in 2019 per patient. Let her know BCCCP will cover Pap smears every 3 years unless has a history of abnormal Pap smears. Referred patient to the [facility] for a diagnostic mammogram and left breast ultrasound. Appointment scheduled for Wednesday, March 26, 2019 at 1030. Patient aware of appointment and will be there. Discussed smoking cessation with patient. Referred to the [facility] Quitline and gave resources to the free smoking cessation classes at [facility]. Theresa [name] verbalized understanding.  Arber Wiemers, Arvil Chaco, RN 2:35 PM

## 2019-03-20 NOTE — Progress Notes (Signed)
Complaints of a left breast lump x 3 weeks that is painful. Patient states the pain comes and goes. Patient rates the pain at a 6-7 out of 10.  Pap Smear: Pap smear not completed today. Last Pap smear was in 2019 at the [facility] and normal per patient. Per patient has no history of an abnormal Pap smear. No Pap smear results are in Epic.  Physical exam: Breasts Breasts symmetrical. No skin abnormalities right breast. Left nipple scabbed due to injury from nipple piercing. Scars observed bilateral axilla from healed boils per patient. No nipple retraction bilateral breasts. No nipple discharge bilateral breasts. No lymphadenopathy. No lumps palpated right breast. Palpated a lump within the left breast at 3 o'clock 3 cm from the nipple. Palpated an area of thickened skin versus lump within the left breast at 1 o'clock 7 cm from the nipple. Complaints of tenderness when palpated left outer breast on exam. Referred patient to the [facility] for a diagnostic mammogram and left breast ultrasound. Appointment scheduled for Wednesday, March 26, 2019 at 1030.        Pelvic/Bimanual No Pap smear completed today since last Pap smear was in 2019 per patient. Pap smear not indicated per BCCCP guidelines.   Smoking History: Patient is a current smoker. Discussed smoking cessation with patient. Referred to the [facility] Quitline and gave resources to the free smoking cessation classes at [facility].  Patient Navigation: Patient education provided. Access to services provided for patient through BCCCP program.   Breast and Cervical Cancer Risk Assessment: Patient has no family history of breast cancer, known genetic mutations, or radiation treatment to the chest before age 5. Patient has no history of cervical dysplasia, immunocompromised, or DES exposure in-utero. Breast cancer risk assessment completed. No breast cancer risk calculated due to patient is less than 90 years  old.

## 2019-03-26 ENCOUNTER — Inpatient Hospital Stay: Admission: RE | Admit: 2019-03-26 | Payer: Self-pay | Source: Ambulatory Visit

## 2019-03-31 ENCOUNTER — Encounter (HOSPITAL_COMMUNITY): Payer: Self-pay | Admitting: *Deleted

## 2019-04-02 ENCOUNTER — Ambulatory Visit
Admission: RE | Admit: 2019-04-02 | Discharge: 2019-04-02 | Disposition: A | Discharge status: Home / Self Care | Payer: Self-pay | Source: Ambulatory Visit | Attending: Obstetrics and Gynecology | Admitting: Obstetrics and Gynecology

## 2019-04-02 ENCOUNTER — Other Ambulatory Visit: Payer: Self-pay

## 2019-04-02 ENCOUNTER — Other Ambulatory Visit (HOSPITAL_COMMUNITY): Payer: Self-pay | Admitting: Obstetrics and Gynecology

## 2019-04-02 DIAGNOSIS — N6320 Unspecified lump in the left breast, unspecified quadrant: Secondary | ICD-10-CM

## 2019-04-04 ENCOUNTER — Other Ambulatory Visit: Payer: Self-pay

## 2019-04-04 ENCOUNTER — Ambulatory Visit
Admission: RE | Admit: 2019-04-04 | Discharge: 2019-04-04 | Disposition: A | Discharge status: Home / Self Care | Payer: No Typology Code available for payment source | Source: Ambulatory Visit | Attending: Obstetrics and Gynecology | Admitting: Obstetrics and Gynecology

## 2019-04-04 ENCOUNTER — Other Ambulatory Visit (HOSPITAL_COMMUNITY): Payer: Self-pay | Admitting: Diagnostic Radiology

## 2019-04-04 DIAGNOSIS — N6320 Unspecified lump in the left breast, unspecified quadrant: Secondary | ICD-10-CM

## 2019-04-24 ENCOUNTER — Ambulatory Visit (INDEPENDENT_AMBULATORY_CARE_PROVIDER_SITE_OTHER): Payer: No Typology Code available for payment source

## 2019-04-24 ENCOUNTER — Ambulatory Visit (HOSPITAL_COMMUNITY)
Admission: EM | Admit: 2019-04-24 | Discharge: 2019-04-24 | Disposition: A | Discharge status: Home / Self Care | Payer: No Typology Code available for payment source | Attending: Family Medicine | Admitting: Family Medicine

## 2019-04-24 ENCOUNTER — Encounter (HOSPITAL_COMMUNITY): Payer: Self-pay | Admitting: Emergency Medicine

## 2019-04-24 ENCOUNTER — Other Ambulatory Visit: Payer: Self-pay

## 2019-04-24 DIAGNOSIS — S92524A Nondisplaced fracture of medial phalanx of right lesser toe(s), initial encounter for closed fracture: Secondary | ICD-10-CM

## 2019-04-24 DIAGNOSIS — W2209XA Striking against other stationary object, initial encounter: Secondary | ICD-10-CM

## 2019-04-24 MED ORDER — IBUPROFEN 800 MG PO TABS
ORAL_TABLET | ORAL | Status: AC
  Filled 2019-04-24: qty 1

## 2019-04-24 MED ORDER — IBUPROFEN 800 MG PO TABS: 800.0000 mg | ORAL_TABLET | Freq: Three times a day (TID) | ORAL | 0 refills | Status: DC

## 2019-04-24 MED ORDER — IBUPROFEN 800 MG PO TABS
800.0000 mg | ORAL_TABLET | Freq: Once | ORAL | Status: AC
  Administered 2019-04-24: 800 mg via ORAL

## 2019-04-24 MED ORDER — HYDROCODONE-ACETAMINOPHEN 7.5-325 MG PO TABS: 1.0000 | ORAL_TABLET | Freq: Four times a day (QID) | ORAL | 0 refills | Status: DC | PRN

## 2019-04-24 MED ORDER — IBUPROFEN 600 MG PO TABS: 600.0000 mg | ORAL_TABLET | Freq: Four times a day (QID) | ORAL | 0 refills | Status: DC | PRN

## 2019-04-24 NOTE — ED Triage Notes (Signed)
Pt here for right 4th toe pain after injuring today

## 2019-04-24 NOTE — Discharge Instructions (Signed)
Stay off of feet as much as you can Tale ibuprofen for pain  Take pain medicine if needed Do not drive on the hydrocodone Ice to reduce pain and swelling Call orthopedic to be seen in follow up

## 2019-04-24 NOTE — ED Provider Notes (Signed)
[facility]    CSN: [id number] Arrival date & time: 04/24/19  1204      History   Chief Complaint Chief Complaint  Patient presents with  . Toe Pain    HPI Theresa [name] is a 28 y.o. female.   HPI  Patient was running downstairs to attend a meeting when she accidentally kicked a "crate".  She immediately had pain and noticed a deformity in her toe.  She reached down snatched her toe back in the place and went ahead and attended her zoom meeting.  Past Medical History:  Diagnosis Date  . Achalasia   . Anxiety   . Asthma   . Chlamydia age 6  . Depression   . GERD (gastroesophageal reflux disease)   . Left fibular fracture 12/2011   referred by ED to outpt ortho. non-surgical mgt.   . Obesity (BMI 30-39.9) 07/2013    Patient Active Problem List   Diagnosis Date Noted  . Screening breast examination 03/20/2019  . Breast lump on left side at 3 o'clock position 03/20/2019  . Esophageal obstruction due to food impaction   . Achalasia 03/24/2015  . 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 Status post repeat low transverse cesarean section 03/23/2015  . Cesarean delivery delivered 03/23/2015  . H/O: depression 11/24/2014  . H/O: C-section - 2010 - NRFHRT 11/23/2014  . Breast lump 11/23/2014  . Infection due to enterococcus - at [facility] - treated 11/23/2014  . Vomiting 08/06/2013  . Tobacco abuse 08/06/2013  . 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 BOTOX INJECTION N/A 01/26/2014   Procedure: BOTOX INJECTION;  Surgeon: Lafayette Dragon, MD;  [facility];  Service: Endoscopy;  Laterality: N/A;  . CESAREAN SECTION  12/2008  . CESAREAN SECTION N/A 03/23/2015   Procedure: CESAREAN SECTION;  Surgeon: Delsa Bern, MD;  Location: [facility];  Service: Obstetrics;  Laterality: N/A;  . ESOPHAGEAL MANOMETRY N/A 08/18/2013   Procedure: ESOPHAGEAL MANOMETRY (EM);  Surgeon:  Lafayette Dragon, MD;  [facility];  Service: Endoscopy;  Laterality: N/A;  . ESOPHAGOGASTRODUODENOSCOPY N/A 01/26/2014   Procedure: ESOPHAGOGASTRODUODENOSCOPY (EGD);  Surgeon: Lafayette Dragon, MD;  [facility];  Service: Endoscopy;  Laterality: N/A;  . ESOPHAGOGASTRODUODENOSCOPY N/A 03/25/2015   Procedure:  EGD with Botox;  Surgeon: Milus Banister, MD;  Location: [facility];  Service: Endoscopy;  Laterality: N/A;  . ESOPHAGOGASTRODUODENOSCOPY N/A 11/01/2015   Procedure: ESOPHAGOGASTRODUODENOSCOPY (EGD);  Surgeon: Jackolyn Confer, MD;  Location: [facility];  Service: General;  Laterality: N/A;  . ESOPHAGOGASTRODUODENOSCOPY N/A 05/08/2017   Procedure: ESOPHAGOGASTRODUODENOSCOPY (EGD);  Surgeon: Gatha Mayer, MD;  Location: [facility];  Service: Endoscopy;  Laterality: N/A;  . ESOPHAGOGASTRODUODENOSCOPY (EGD) WITH ESOPHAGEAL DILATION N/A 08/06/2013   Procedure: ESOPHAGOGASTRODUODENOSCOPY (EGD) WITH ESOPHAGEAL DILATION;  Surgeon: Lafayette Dragon, MD;  Location: [facility];  Service: Endoscopy;  Laterality: N/A;  . FOREIGN BODY REMOVAL N/A 05/08/2017   Procedure: FOREIGN BODY REMOVAL;  Surgeon: Gatha Mayer, MD;  Location: [facility];  Service: Endoscopy;  Laterality: N/A;  . HELLER MYOTOMY N/A 11/01/2015   Procedure: LAPAROSCOPIC HELLER MYOTOMY AND [place] FUNDOPLICATION;  Surgeon: Jackolyn Confer, MD;  [facility];  Service: General;  Laterality: N/A;  . TONSILLECTOMY  07/2007   Dr Radene Journey    OB History    Gravida  3   Para  2   Term  2   Preterm  AB  1   Living  2     SAB      TAB  1   Ectopic      Multiple  0   Live Births  2            Home Medications    Prior to Admission medications   Medication Sig Start Date End Date Taking? Authorizing Provider  clonazePAM (KLONOPIN) 1 MG tablet Take 1 mg by mouth daily.  03/20/17   [provider]  Cyanocobalamin (B-12 PO) Take 1 tablet by mouth daily.    [provider]   Desvenlafaxine Succinate ER 25 MG TB24 Take 50 mg by mouth daily as needed (anxiety).  05/29/17   [provider]  fluticasone (FLONASE) 50 MCG/ACT nasal spray Place 1 spray into both nostrils daily. 06/06/18   Elpidio Anis, PA-C  HYDROcodone-acetaminophen (NORCO) 7.5-325 MG tablet Take 1 tablet by mouth every 6 (six) hours as needed for moderate pain. 04/24/19   Eustace Moore, MD  ibuprofen (ADVIL) 800 MG tablet Take 1 tablet (800 mg total) by mouth 3 (three) times daily. 04/24/19   Eustace Moore, MD  omeprazole (PRILOSEC) 40 MG capsule Take one pill 30 minutes before breakfast every morning. 05/16/17   [name], MD    Family History Family History  Problem Relation Age of Onset  . Hypertension Other   . Colon cancer Neg Hx   . Esophageal cancer Neg Hx   . Rectal cancer Neg Hx   . Stomach cancer Neg Hx   . Colon polyps Neg Hx     Social History Social History   Tobacco Use  . Smoking status: Current Every Day Smoker    Packs/day: 0.25    Types: Cigarettes    Last attempt to quit: 10/24/2014    Years since quitting: 4.5  . Smokeless tobacco: Never Used  Substance Use Topics  . Alcohol use: Yes    Comment: occassionally  . Drug use: No     Allergies   Patient has no known allergies.   Review of Systems Review of Systems  Constitutional: Negative for chills and fever.  HENT: Negative for ear pain and sore throat.   Eyes: Negative for pain and visual disturbance.  Respiratory: Negative for cough and shortness of breath.   Cardiovascular: Negative for chest pain and palpitations.  Gastrointestinal: Negative for abdominal pain and vomiting.  Genitourinary: Negative for dysuria and hematuria.  Musculoskeletal: Positive for gait problem. Negative for arthralgias and back pain.  Skin: Negative for color change and rash.  Neurological: Negative for seizures and syncope.  All other systems reviewed and are negative.    Physical Exam Triage Vital  Signs ED Triage Vitals [04/24/19 1241]  Enc Vitals Group     BP 108/69     Pulse Rate 72     Resp 18     Temp 98.3 F (36.8 C)     Temp Source Oral     SpO2 99 %     Weight      Height      Head Circumference      Peak Flow      Pain Score 8     Pain Loc      Pain Edu?      Excl. in GC?    No data found.  Updated Vital Signs BP 108/69 (BP Location: Right Arm)   Pulse 72   Temp 98.3 F (36.8 C) (Oral)  Resp 18   LMP 04/20/2019   SpO2 99%   Visual Acuity Right Eye Distance:   Left Eye Distance:   Bilateral Distance:    Right Eye Near:   Left Eye Near:    Bilateral Near:     Physical Exam Musculoskeletal:     Comments: Fourth toe on right foot appears to have some lateral deviation.  Soft tissue swelling.      UC Treatments / Results  Labs (all labs ordered are listed, but only abnormal results are displayed) Labs Reviewed - No data to display  EKG   Radiology Dg Foot Complete Right  Result Date: 04/24/2019 CLINICAL DATA:  Acute RIGHT foot pain following injury today. Initial encounter. EXAM: RIGHT FOOT COMPLETE - 3+ VIEW COMPARISON:  None. FINDINGS: A nondisplaced oblique fracture of the 4th toe proximal phalanx is noted. No subluxation or dislocation. No other fractures are identified. IMPRESSION: Nondisplaced fracture of the 4th toe proximal phalanx. Electronically Signed   By: Harmon PierJeffrey  Hu M.D.   On: 04/24/2019 13:47    Procedures Procedures (including critical care time)  Medications Ordered in UC Medications  ibuprofen (ADVIL) tablet 800 mg (800 mg Oral Given 04/24/19 1328)  ibuprofen (ADVIL) 800 MG tablet (has no administration in time range)    Initial Impression / Assessment and Plan / UC Course  I have reviewed the triage vital signs and the nursing notes.  Pertinent labs & imaging results that were available during my care of the patient were reviewed by me and considered in my medical decision making (see chart for details).     Toe  is buddy taped.  it has a better alignment.  Recommend prompt follow-up with orthopedist Final Clinical Impressions(s) / UC Diagnoses   Final diagnoses:  Closed nondisplaced fracture of middle phalanx of lesser toe of right foot, initial encounter     Discharge Instructions     Stay off of feet as much as you can Tale ibuprofen for pain  Take pain medicine if needed Do not drive on the hydrocodone Ice to reduce pain and swelling Call orthopedic to be seen in follow up   ED Prescriptions    Medication Sig Dispense Auth. Provider   ibuprofen (ADVIL) 800 MG tablet Take 1 tablet (800 mg total) by mouth 3 (three) times daily. 21 tablet Eustace MooreNelson, Jeriah Corkum Sue, MD   ibuprofen (ADVIL) 600 MG tablet  (Status: Discontinued) Take 1 tablet (600 mg total) by mouth every 6 (six) hours as needed. 30 tablet Eustace MooreNelson, Bishop Vanderwerf Sue, MD   HYDROcodone-acetaminophen [employer] [place](NORCO) 7.5-325 MG tablet Take 1 tablet by mouth every 6 (six) hours as needed for moderate pain. 10 tablet Eustace MooreNelson, Lameeka Schleifer Sue, MD     I have reviewed the PDMP during this encounter.   Eustace MooreNelson, Genecis Veley Sue, MD 04/24/19 2029

## 2019-04-29 ENCOUNTER — Other Ambulatory Visit: Payer: Self-pay

## 2019-04-29 ENCOUNTER — Ambulatory Visit (INDEPENDENT_AMBULATORY_CARE_PROVIDER_SITE_OTHER): Payer: Self-pay

## 2019-04-29 ENCOUNTER — Ambulatory Visit (INDEPENDENT_AMBULATORY_CARE_PROVIDER_SITE_OTHER): Payer: Self-pay | Admitting: Orthopaedic Surgery

## 2019-04-29 ENCOUNTER — Encounter: Payer: Self-pay | Admitting: Orthopaedic Surgery

## 2019-04-29 VITALS — Ht 64.0 in | Wt 190.0 lb

## 2019-04-29 DIAGNOSIS — M79671 Pain in right foot: Secondary | ICD-10-CM

## 2019-04-29 MED ORDER — TRAMADOL HCL 50 MG PO TABS: 50.0000 mg | ORAL_TABLET | Freq: Four times a day (QID) | ORAL | 1 refills | Status: DC | PRN
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
## 2019-05-13 ENCOUNTER — Ambulatory Visit: Payer: Self-pay | Admitting: Orthopaedic Surgery

## 2019-11-12 ENCOUNTER — Ambulatory Visit (HOSPITAL_COMMUNITY)
Admission: EM | Admit: 2019-11-12 | Discharge: 2019-11-12 | Disposition: A | Discharge status: Home / Self Care | Payer: Self-pay | Attending: Family Medicine | Admitting: Family Medicine

## 2019-11-12 ENCOUNTER — Other Ambulatory Visit: Payer: Self-pay

## 2019-11-12 ENCOUNTER — Encounter (HOSPITAL_COMMUNITY): Payer: Self-pay

## 2019-11-12 DIAGNOSIS — S20212A Contusion of left front wall of thorax, initial encounter: Secondary | ICD-10-CM

## 2019-11-12 MED ORDER — IBUPROFEN 800 MG PO TABS: 800.0000 mg | ORAL_TABLET | Freq: Three times a day (TID) | ORAL | 0 refills | Status: DC
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
## 2019-12-19 DIAGNOSIS — Z1388 Encounter for screening for disorder due to exposure to contaminants: Secondary | ICD-10-CM | POA: Diagnosis not present

## 2019-12-19 DIAGNOSIS — Z0389 Encounter for observation for other suspected diseases and conditions ruled out: Secondary | ICD-10-CM | POA: Diagnosis not present

## 2019-12-19 DIAGNOSIS — Z3009 Encounter for other general counseling and advice on contraception: Secondary | ICD-10-CM | POA: Diagnosis not present

## 2020-01-24 ENCOUNTER — Emergency Department (HOSPITAL_COMMUNITY)
Admission: EM | Admit: 2020-01-24 | Discharge: 2020-01-24 | Disposition: A | Discharge status: Home / Self Care | Payer: Medicaid Other | Attending: Emergency Medicine | Admitting: Emergency Medicine

## 2020-01-24 ENCOUNTER — Encounter (HOSPITAL_COMMUNITY): Payer: Self-pay | Admitting: *Deleted

## 2020-01-24 ENCOUNTER — Other Ambulatory Visit: Payer: Self-pay

## 2020-01-24 DIAGNOSIS — F17210 Nicotine dependence, cigarettes, uncomplicated: Secondary | ICD-10-CM | POA: Diagnosis not present

## 2020-01-24 DIAGNOSIS — J45909 Unspecified asthma, uncomplicated: Secondary | ICD-10-CM | POA: Diagnosis not present

## 2020-01-24 DIAGNOSIS — L0231 Cutaneous abscess of buttock: Secondary | ICD-10-CM | POA: Diagnosis not present

## 2020-01-24 DIAGNOSIS — Z79899 Other long term (current) drug therapy: Secondary | ICD-10-CM | POA: Insufficient documentation

## 2020-01-24 DIAGNOSIS — L0291 Cutaneous abscess, unspecified: Secondary | ICD-10-CM

## 2020-01-24 DIAGNOSIS — R222 Localized swelling, mass and lump, trunk: Secondary | ICD-10-CM | POA: Diagnosis present

## 2020-01-24 MED ORDER — LIDOCAINE-EPINEPHRINE (PF) 2 %-1:200000 IJ SOLN
10.0000 mL | Freq: Once | INTRAMUSCULAR | Status: AC
  Administered 2020-01-24: 10 mL
  Filled 2020-01-24: qty 20

## 2020-01-24 MED ORDER — LORAZEPAM 2 MG/ML IJ SOLN
0.5000 mg | Freq: Once | INTRAMUSCULAR | Status: AC
  Administered 2020-01-24: 0.5 mg via INTRAVENOUS
  Filled 2020-01-24: qty 1

## 2020-01-24 MED ORDER — SODIUM CHLORIDE 0.9 % IV BOLUS
500.0000 mL | Freq: Once | INTRAVENOUS | Status: AC
  Administered 2020-01-24: 500 mL via INTRAVENOUS

## 2020-01-24 MED ORDER — LIDOCAINE-PRILOCAINE 2.5-2.5 % EX CREA
TOPICAL_CREAM | Freq: Once | CUTANEOUS | Status: AC
  Filled 2020-01-24: qty 5

## 2020-01-24 MED ORDER — MORPHINE SULFATE (PF) 4 MG/ML IV SOLN
4.0000 mg | Freq: Once | INTRAVENOUS | Status: AC
  Administered 2020-01-24: 4 mg via INTRAVENOUS
  Filled 2020-01-24: qty 1

## 2020-01-24 MED ORDER — ONDANSETRON HCL 4 MG/2ML IJ SOLN
4.0000 mg | Freq: Once | INTRAMUSCULAR | Status: AC
  Administered 2020-01-24: 4 mg via INTRAVENOUS
  Filled 2020-01-24: qty 2

## 2020-01-24 MED ORDER — HYDROCODONE-ACETAMINOPHEN 5-325 MG PO TABS: 1.0000 | ORAL_TABLET | Freq: Four times a day (QID) | ORAL | 0 refills | Status: DC | PRN

## 2020-01-24 NOTE — ED Provider Notes (Signed)
[facility] EMERGENCY DEPARTMENT Provider Note   CSN: [id number] Arrival date & time: 01/24/20  1453     History Chief Complaint  Patient presents with  . Abscess    Theresa [name] is a 29 y.o. female.  HPI      Theresa [name] is a 29 y.o. female, with a history of GERD, anxiety, asthma, obesity, presenting to the ED with abscess to the left buttocks noted about 5 days ago. She states she previously had an abscess in this location years ago that required drainage. She has been applying warm compresses and taking sitz baths without improvement.  Denies fever/chills, abdominal pain, vaginal pain, rectal pain, drainage from the region, or any other complaints.  Past Medical History:  Diagnosis Date  . Achalasia   . Anxiety   . Asthma   . Chlamydia age 44  . Depression   . GERD (gastroesophageal reflux disease)   . Left fibular fracture 12/2011   referred by ED to outpt ortho. non-surgical mgt.   . Obesity (BMI 30-39.9) 07/2013    Patient Active Problem List   Diagnosis Date Noted  . Screening breast examination 03/20/2019  . Breast lump on left side at 3 o'clock position 03/20/2019  . Esophageal obstruction due to food impaction   . Achalasia 03/24/2015  . 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 Status post repeat low transverse cesarean section 03/23/2015  . Cesarean delivery delivered 03/23/2015  . H/O: depression 11/24/2014  . H/O: C-section - 2010 - NRFHRT 11/23/2014  . Breast lump 11/23/2014  . Infection due to enterococcus - at [facility] - treated 11/23/2014  . Vomiting 08/06/2013  . Tobacco abuse 08/06/2013  . 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 BOTOX INJECTION N/A 01/26/2014   Procedure: BOTOX INJECTION;  Surgeon: Hart Carwin, MD;  [facility];  Service: Endoscopy;  Laterality: N/A;  . CESAREAN SECTION  12/2008  . CESAREAN  SECTION N/A 03/23/2015   Procedure: CESAREAN SECTION;  Surgeon: Silverio Lay, MD;  Location: [facility];  Service: Obstetrics;  Laterality: N/A;  . ESOPHAGEAL MANOMETRY N/A 08/18/2013   Procedure: ESOPHAGEAL MANOMETRY (EM);  Surgeon: Hart Carwin, MD;  [facility];  Service: Endoscopy;  Laterality: N/A;  . ESOPHAGOGASTRODUODENOSCOPY N/A 01/26/2014   Procedure: ESOPHAGOGASTRODUODENOSCOPY (EGD);  Surgeon: Hart Carwin, MD;  Location: [facility];  Service: Endoscopy;  Laterality: N/A;  . ESOPHAGOGASTRODUODENOSCOPY N/A 03/25/2015   Procedure:  EGD with Botox;  Surgeon: [name], MD;  Location: [facility];  Service: Endoscopy;  Laterality: N/A;  . ESOPHAGOGASTRODUODENOSCOPY N/A 11/01/2015   Procedure: ESOPHAGOGASTRODUODENOSCOPY (EGD);  Surgeon: Avel Peace, MD;  [facility]: [facility];  Service: General;  Laterality: N/A;  . ESOPHAGOGASTRODUODENOSCOPY N/A 05/08/2017   Procedure: ESOPHAGOGASTRODUODENOSCOPY (EGD);  Surgeon: Iva Boop, MD;  Location: [facility] END[facility]COPY;  Service: Endoscopy;  Laterality: N/A;  . ESOPHAGOGASTRODUODENOSCOPY (EGD) WITH ESOPHAGEAL DILATION N/A 08/06/2013   Procedure: ESOPHAGOGASTRODUODENOSCOPY (EGD) WITH ESOPHAGEAL DILATION;  Surgeon: Hart Carwin, MD;  Location: [facility] END[facility]COPY;  Service: Endoscopy;  Laterality: N/A;  . FOREIGN BODY REMOVAL N/A 05/08/2017   Procedure: FOREIGN BODY REMOVAL;  Surgeon: Iva Boop, MD;  Location: [facility] ENDOSCOPY;  Service: Endoscopy;  Laterality: N/A;  . HELLER MYOTOMY N/A 11/01/2015   Procedure: LAPAROSCOPIC HELLER MYOTOMY AND DOR FUNDOPLICATION;  Surgeon: Avel Peace, MD;  Location: [facility];  Service: General;  Laterality: N/A;  . TONSILLECTOMY  07/2007  Dr Narda Bonds     OB History    Gravida  3   Para  2   Term  2   Preterm      AB  1   Living  2     SAB      TAB  1   Ectopic      Multiple  0   Live Births  2           Family History  Problem Relation Age of Onset  . Hypertension Other   .  Colon cancer Neg Hx   . Esophageal cancer Neg Hx   . Rectal cancer Neg Hx   . Stomach cancer Neg Hx   . Colon polyps Neg Hx     Social History   Tobacco Use  . Smoking status: Current Every Day Smoker    Packs/day: 0.25    Types: Cigarettes    Last attempt to quit: 10/24/2014    Years since quitting: 5.2  . Smokeless tobacco: Never Used  Vaping Use  . Vaping Use: Never used  Substance Use Topics  . Alcohol use: Yes    Comment: occassionally  . Drug use: No    Home Medications Prior to Admission medications   Medication Sig Start Date End Date Taking? Authorizing Provider  HYDROcodone-acetaminophen (NORCO/VICODIN) 5-325 MG tablet Take 1-2 tablets by mouth every 6 (six) hours as needed for severe pain. 01/24/20   Gadge Hermiz C, PA-C  ibuprofen (ADVIL) 800 MG tablet Take 1 tablet (800 mg total) by mouth 3 (three) times daily with meals. 11/12/19   Mardella Layman, MD  clonazePAM (KLONOPIN) 1 MG tablet Take 1 mg by mouth daily.  03/20/17 11/12/19  [provider]  Desvenlafaxine Succinate ER 25 MG TB24 Take 50 mg by mouth daily as needed (anxiety).  05/29/17 11/12/19  [provider]  fluticasone (FLONASE) 50 MCG/ACT nasal spray Place 1 spray into both nostrils daily. 06/06/18 11/12/19  Elpidio Anis, PA-C  omeprazole (PRILOSEC) 40 MG capsule Take one pill 30 minutes before breakfast every morning. 05/16/17 11/12/19  [name], MD    Allergies    Patient has no known allergies.  Review of Systems   Review of Systems  Constitutional: Negative for fever.  Gastrointestinal: Negative for abdominal pain.  Skin:       Abscess she states is in the perianal region.    Physical Exam Updated Vital Signs BP 103/67 (BP Location: Left Arm)   Pulse (!) 106   Temp 98.9 F (37.2 C) (Oral)   Resp 18   Ht 5\' 4"  (1.626 m)   Wt 81.6 kg   LMP 12/29/2019   SpO2 96%   BMI 30.88 kg/m   Physical Exam Vitals and nursing note reviewed. Exam conducted with a chaperone  present.  Constitutional:      General: She is not in acute distress.    Appearance: She is well-developed. She is obese. She is not diaphoretic.  HENT:     Head: Normocephalic and atraumatic.  Eyes:     Conjunctiva/sclera: Conjunctivae normal.  Cardiovascular:     Rate and Rhythm: Normal rate and regular rhythm.  Pulmonary:     Effort: Pulmonary effort is normal.  Abdominal:     Palpations: Abdomen is soft.     Tenderness: There is no abdominal tenderness.  Genitourinary:      Comments: No tenderness, swelling, fluctuance, or color abnormality to the anus, perineum, or labia.  RN, 12/31/2019, present  for exam. Musculoskeletal:     Cervical back: Neck supple.  Skin:    General: Skin is warm and dry.     Coloration: Skin is not pale.  Neurological:     Mental Status: She is alert.  Psychiatric:        Behavior: Behavior normal.     ED Results / Procedures / Treatments   Labs (all labs ordered are listed, but only abnormal results are displayed) Labs Reviewed - No data to display  EKG None  Radiology No results found.  Procedures Ultrasound ED Peripheral IV (Provider)  Date/Time: 01/24/2020 6:30 PM Performed by: Anselm PancoastJoy, Moria Brophy C, PA-C Authorized by: Anselm PancoastJoy, Gizella Belleville C, PA-C   Procedure details:    Indications: poor IV access     Skin Prep: chlorhexidine gluconate     Location:  Left AC   Angiocath:  20 G   Bedside Ultrasound Guided: Yes     Images: archived     Patient tolerated procedure without complications: Yes     Dressing applied: Yes   Comments:     Positive flash.  Advanced and flushed without pain, swelling, or other signs of infiltration. Ultrasound ED Soft Tissue  Date/Time: 01/24/2020 7:40 PM Performed by: Anselm PancoastJoy, Arlett Goold C, PA-C Authorized by: Anselm PancoastJoy, Jennah Satchell C, PA-C   Procedure details:    Indications: localization of abscess     Transverse view:  Visualized   Longitudinal view:  Visualized   Images: archived   Location:    Location: lower extremity      Side:  Right Findings:     abscess present .Marland Kitchen.Incision and Drainage  Date/Time: 01/24/2020 8:10 PM Performed by: Anselm PancoastJoy, Rashi Giuliani C, PA-C Authorized by: Anselm PancoastJoy, Taevin Mcferran C, PA-C   Consent:    Consent obtained:  Verbal   Consent given by:  Patient   Risks discussed:  Bleeding, incomplete drainage and pain Location:    Type:  Abscess   Size:  4x3 cm   Location:  Lower extremity   Lower extremity location:  Buttock   Buttock location:  L buttock Procedure type:    Complexity:  Complex Procedure details:    Incision types:  Single straight   Incision depth:  Subcutaneous   Scalpel blade:  11   Wound management:  Probed and deloculated, irrigated with saline and extensive cleaning   Drainage:  Purulent and bloody   Drainage amount:  Copious   Packing materials:  1/4 in iodoform gauze Post-procedure details:    Patient tolerance of procedure:  Tolerated well, no immediate complications Comments:     RN, Thayer Ohmhris, chaperoned during procedure.   (including critical care time)  Medications Ordered in ED Medications  morphine 4 MG/ML injection 4 mg (4 mg Intravenous Given 01/24/20 1841)  ondansetron (ZOFRAN) injection 4 mg (4 mg Intravenous Given 01/24/20 1841)  sodium chloride 0.9 % bolus 500 mL (0 mLs Intravenous Stopped 01/24/20 2104)  lidocaine-prilocaine (EMLA) cream ( Topical Given 01/24/20 1848)  lidocaine-EPINEPHrine (XYLOCAINE W/EPI) 2 %-1:200000 (PF) injection 10 mL (10 mLs Infiltration Given 01/24/20 2105)  LORazepam (ATIVAN) injection 0.5 mg (0.5 mg Intravenous Given 01/24/20 2000)    ED Course  I have reviewed the triage vital signs and the nursing notes.  Pertinent labs & imaging results that were available during my care of the patient were reviewed by me and considered in my medical decision making (see chart for details).    MDM Rules/Calculators/A&P  Patient presents with abscess to the left buttock region.  The abscess does not seem to extend close to  the vagina, perineum, or anus. Patient is nontoxic appearing, afebrile, not tachycardic on my exam, not tachypneic, not hypotensive. I&D without immediate complication. PCP versus ED return in 3 days for packing removal and wound check. General surgery follow-up should abscess reform.  Resources given. The patient was given instructions for home care as well as return precautions. Patient voices understanding of these instructions, accepts the plan, and is comfortable with discharge.  Vitals:   01/24/20 1457 01/24/20 1508 01/24/20 2103  BP: 103/67  120/64  Pulse: (!) 106  88  Resp: 18  18  Temp: 98.9 F (37.2 C)    TempSrc: Oral    SpO2: 96%  97%  Weight: 81.6 kg 81.6 kg   Height: 5\' 4"  (1.626 m) 5\' 4"  (1.626 m)      Final Clinical Impression(s) / ED Diagnoses Final diagnoses:  Abscess    Rx / DC Orders ED Discharge Orders         Ordered    HYDROcodone-acetaminophen (NORCO/VICODIN) 5-325 MG tablet  Every 6 hours PRN     Discontinue  Reprint     01/24/20 2034           03/26/20, PA-C 01/24/20 2115    03/26/20, MD 01/25/20 2117

## 2020-01-24 NOTE — ED Triage Notes (Signed)
The pt is c/o an abscess around her rectum for one week  Hx  The same  lmp  June 12th

## 2020-01-24 NOTE — Discharge Instructions (Addendum)
Wound Care - After I&D of Abscess  You may remove the bandage after 24 hours.  The only reason to replace the bandage is to protect clothing from drainage. Bandages, if used, should be replaced daily or whenever soiled. The wound may continue to drain for the next 2-3 days.   Cleaning: Clean the wound and surrounding area gently with tap water and mild soap. Rinse well and blot dry. You may shower normally. Soaking the wound in Epsom salt baths for no more than 15 minutes once a day may help rinse out any remaining pus and help with wound healing.  Clean the wound daily to prevent further infection. Do not use cleaners such as hydrogen peroxide or alcohol.   Scar reduction: Application of a topical antibiotic ointment, such as Neosporin, after the wound has begun to close and heal well can decrease scab formation and reduce scarring. After the wound has healed, application of ointments such as Aquaphor can also reduce scar formation.  The key to scar reduction is keeping the skin well hydrated and supple. Drinking plenty of water throughout the day (At least eight 8oz glasses of water a day) is essential to staying well hydrated.  Sun exposure: Keep the wound out of the sun. After the wound has healed, continue to protect it from the sun by wearing protective clothing or applying sunscreen.  Pain: You may use Tylenol, naproxen, or ibuprofen for pain. Antiinflammatory medications: Take 600 mg of ibuprofen every 6 hours or 440 mg (over the counter dose) to 500 mg (prescription dose) of naproxen every 12 hours for the next 3 days. After this time, these medications may be used as needed for pain. Take these medications with food to avoid upset stomach. Choose only one of these medications, do not take them together. Acetaminophen (generic for Tylenol): Should you continue to have additional pain while taking the ibuprofen or naproxen, you may add in acetaminophen as needed. Your daily total maximum amount  of acetaminophen from all sources should be limited to 4000mg /day for persons without liver problems, or 2000mg /day for those with liver problems. Vicodin: May take Vicodin (hydrocodone-acetaminophen) as needed for severe pain.   Do not drive or perform other dangerous activities while taking this medication as it can cause drowsiness as well as changes in reaction time and judgement.   Please note that each pill of Vicodin contains 325 mg of acetaminophen (generic for Tylenol) and the above dosage limits apply.  Prevention: There are some people who have a predisposition to abscess formation, however, there are some things that can be done to prevent abscesses in many people.  Most abscesses form because bacteria that naturally lives on the skin gets trapped underneath the skin.  This can occur through openings too small to see. Before and after any area of skin is shaved, wax, or abraded in any manner, the area should be washed with soap and water and rinsed well.   If you are having trouble with recurrent abscesses, it may be wise to perform a chlorhexidine wash regimen.  For 1 week, wash all of your body with chlorhexidine (available over-the-counter at most pharmacies). You may also need to reevaluate your use of daily soap as soaps with perfumes or dyes can increase the chances of infection in some people.  Follow up: Please return to the ED or go to your primary care provider in 2-3 days for a wound check to assure proper healing. Should this abscess reform, please follow-up with general  surgery in the office.  Call to make an appointment.  Return: Return to the ED sooner should signs of worsening infection arise, such as spreading redness, worsening puffiness/swelling, severe increase in pain, fever over 100.94F, or any other major issues.  For prescription assistance, may try using prescription discount sites or apps, such as goodrx.com

## 2020-01-25 NOTE — ED Notes (Signed)
Ativan 1.5 mg wasted with bobby s rn pts name already out of the system

## 2020-02-23 ENCOUNTER — Ambulatory Visit (HOSPITAL_COMMUNITY)
Admission: EM | Admit: 2020-02-23 | Discharge: 2020-02-23 | Disposition: A | Discharge status: Home / Self Care | Payer: Medicaid Other | Attending: Family Medicine | Admitting: Family Medicine

## 2020-02-23 ENCOUNTER — Other Ambulatory Visit: Payer: Self-pay

## 2020-02-23 ENCOUNTER — Encounter (HOSPITAL_COMMUNITY): Payer: Self-pay

## 2020-02-23 DIAGNOSIS — N760 Acute vaginitis: Secondary | ICD-10-CM | POA: Diagnosis not present

## 2020-02-23 DIAGNOSIS — L732 Hidradenitis suppurativa: Secondary | ICD-10-CM | POA: Diagnosis not present

## 2020-02-23 DIAGNOSIS — Z114 Encounter for screening for human immunodeficiency virus [HIV]: Secondary | ICD-10-CM | POA: Diagnosis not present

## 2020-02-23 DIAGNOSIS — Z20822 Contact with and (suspected) exposure to covid-19: Secondary | ICD-10-CM | POA: Diagnosis not present

## 2020-02-23 DIAGNOSIS — Z113 Encounter for screening for infections with a predominantly sexual mode of transmission: Secondary | ICD-10-CM | POA: Diagnosis not present

## 2020-02-23 DIAGNOSIS — J069 Acute upper respiratory infection, unspecified: Secondary | ICD-10-CM | POA: Diagnosis not present

## 2020-02-23 NOTE — Discharge Instructions (Signed)
Go home to rest Drink plenty of fluids Take Tylenol for pain or fever You may take over-the-counter cough and cold medicines as needed You must quarantine at home until your test result is available You can check for your test result in MyChart  

## 2020-02-23 NOTE — ED Triage Notes (Signed)
Pt c/o non-productive cough, congestion, runny nose, scratchy throat since Thursday/Friday. Was exposed to COVID positive co-worker on Thursday and Friday. Denies fever, chills, SOB, n/v/d.  No oTC used.

## 2020-02-23 NOTE — ED Provider Notes (Signed)
[facility]    CSN: [id number] Arrival date & time: 02/23/20  1823      History   Chief Complaint Chief Complaint  Patient presents with  . Cough  . Nasal Congestion    HPI Theresa [name] is a 29 y.o. female.   HPI  Patient was exposed to Covid at work She does not have symptoms She would like Covid testing She has not had the vaccinations  Past Medical History:  Diagnosis Date  . Achalasia   . Anxiety   . Asthma   . Chlamydia age 31  . Depression   . GERD (gastroesophageal reflux disease)   . Left fibular fracture 12/2011   referred by ED to outpt ortho. non-surgical mgt.   . Obesity (BMI 30-39.9) 07/2013    Patient Active Problem List   Diagnosis Date Noted  . Screening breast examination 03/20/2019  . Breast lump on left side at 3 o'clock position 03/20/2019  . Esophageal obstruction due to food impaction   . Achalasia 03/24/2015  . 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 Status post repeat low transverse cesarean section 03/23/2015  . Cesarean delivery delivered 03/23/2015  . H/O: depression 11/24/2014  . H/O: C-section - 2010 - NRFHRT 11/23/2014  . Breast lump 11/23/2014  . Infection due to enterococcus - at [facility] - treated 11/23/2014  . Vomiting 08/06/2013  . Tobacco abuse 08/06/2013  . 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 BOTOX INJECTION N/A 01/26/2014   Procedure: BOTOX INJECTION;  Surgeon: Hart Carwin, MD;  Location: [facility];  Service: Endoscopy;  Laterality: N/A;  . CESAREAN SECTION  12/2008  . CESAREAN SECTION N/A 03/23/2015   Procedure: CESAREAN SECTION;  Surgeon: Silverio Lay, MD;  Location: [facility];  Service: Obstetrics;  Laterality: N/A;  . ESOPHAGEAL MANOMETRY N/A 08/18/2013   Procedure: ESOPHAGEAL MANOMETRY (EM);  Surgeon: Hart Carwin, MD;  Location: [facility];  Service: Endoscopy;  Laterality: N/A;  .  ESOPHAGOGASTRODUODENOSCOPY N/A 01/26/2014   Procedure: ESOPHAGOGASTRODUODENOSCOPY (EGD);  Surgeon: Hart Carwin, MD;  Location: [facility];  Service: Endoscopy;  Laterality: N/A;  . ESOPHAGOGASTRODUODENOSCOPY N/A 03/25/2015   Procedure:  EGD with Botox;  Surgeon: [name], MD;  Location: [facility];  Service: Endoscopy;  Laterality: N/A;  . ESOPHAGOGASTRODUODENOSCOPY N/A 11/01/2015   Procedure: ESOPHAGOGASTRODUODENOSCOPY (EGD);  Surgeon: Avel Peace, MD;  Location: [facility];  Service: General;  Laterality: N/A;  . ESOPHAGOGASTRODUODENOSCOPY N/A 05/08/2017   Procedure: ESOPHAGOGASTRODUODENOSCOPY (EGD);  Surgeon: Iva Boop, MD;  Location: [facility]COPY;  Service: Endoscopy;  Laterality: N/A;  . ESOPHAGOGASTRODUODENOSCOPY (EGD) WITH ESOPHAGEAL DILATION N/A 08/06/2013   Procedure: ESOPHAGOGASTRODUODENOSCOPY (EGD) WITH ESOPHAGEAL DILATION;  Surgeon: Hart Carwin, MD;  Location: [facility]COPY;  Service: Endoscopy;  Laterality: N/A;  . FOREIGN BODY REMOVAL N/A 05/08/2017   Procedure: FOREIGN BODY REMOVAL;  Surgeon: Iva Boop, MD;  Location: [facility]COPY;  Service: Endoscopy;  Laterality: N/A;  . HELLER MYOTOMY N/A 11/01/2015   Procedure: LAPAROSCOPIC HELLER MYOTOMY AND DOR FUNDOPLICATION;  Surgeon: Avel Peace, MD;  Location: [facility];  Service: General;  Laterality: N/A;  . TONSILLECTOMY  07/2007   Dr Narda Bonds    OB History    Gravida  3   Para  2   Term  2   Preterm      AB  1   Living  2     SAB  TAB  1   Ectopic      Multiple  0   Live Births  2            Home Medications    Prior to Admission medications   Medication Sig Start Date End Date Taking? Authorizing Provider  ibuprofen (ADVIL) 800 MG tablet Take 1 tablet (800 mg total) by mouth 3 (three) times daily with meals. 11/12/19   Mardella Layman, MD  clonazePAM (KLONOPIN) 1 MG tablet Take 1 mg by mouth daily.  03/20/17 11/12/19  [provider]  Desvenlafaxine Succinate ER 25 MG  TB24 Take 50 mg by mouth daily as needed (anxiety).  05/29/17 11/12/19  [provider]  fluticasone (FLONASE) 50 MCG/ACT nasal spray Place 1 spray into both nostrils daily. 06/06/18 11/12/19  Elpidio Anis, PA-C  omeprazole (PRILOSEC) 40 MG capsule Take one pill 30 minutes before breakfast every morning. 05/16/17 11/12/19  [name], MD    Family History Family History  Problem Relation Age of Onset  . Hypertension Other   . Colon cancer Neg Hx   . Esophageal cancer Neg Hx   . Rectal cancer Neg Hx   . Stomach cancer Neg Hx   . Colon polyps Neg Hx     Social History Social History   Tobacco Use  . Smoking status: Current Every Day Smoker    Packs/day: 0.25    Types: Cigarettes    Last attempt to quit: 10/24/2014    Years since quitting: 5.3  . Smokeless tobacco: Never Used  Vaping Use  . Vaping Use: Never used  Substance Use Topics  . Alcohol use: Yes    Comment: occassionally  . Drug use: No     Allergies   Patient has no known allergies.   Review of Systems Review of Systems  See HPI  physical Exam Triage Vital Signs ED Triage Vitals  Enc Vitals Group     BP 02/23/20 2023 113/70     Pulse Rate 02/23/20 2023 75     Resp 02/23/20 2023 16     Temp 02/23/20 2023 98.8 F (37.1 C)     Temp Source 02/23/20 2023 Oral     SpO2 02/23/20 2023 100 %     Weight --      Height --      Head Circumference --      Peak Flow --      Pain Score 02/23/20 2029 0     Pain Loc --      Pain Edu? --      Excl. in GC? --    No data found.  Updated Vital Signs BP 113/70 (BP Location: Left Arm)   Pulse 75   Temp 98.8 F (37.1 C) (Oral)   Resp 16   LMP 02/01/2020   SpO2 100%   Physical Exam Constitutional:      General: She is not in acute distress.    Appearance: She is well-developed.  HENT:     Head: Normocephalic and atraumatic.     Mouth/Throat:     Comments: Mask in place Eyes:     Conjunctiva/sclera: Conjunctivae normal.     Pupils: Pupils  are equal, round, and reactive to light.  Cardiovascular:     Rate and Rhythm: Normal rate.  Pulmonary:     Effort: Pulmonary effort is normal. No respiratory distress.     Comments: Lungs are clear Musculoskeletal:        General: Normal range of  motion.     Cervical back: Normal range of motion.  Skin:    General: Skin is warm and dry.  Neurological:     General: No focal deficit present.     Mental Status: She is alert.  Psychiatric:        Mood and Affect: Mood normal.        Behavior: Behavior normal.      UC Treatments / Results  Labs (all labs ordered are listed, but only abnormal results are displayed) Labs Reviewed  SARS CORONAVIRUS 2 (TAT 6-24 HRS)    EKG   Radiology No results found.  Procedures Procedures (including critical care time)  Medications Ordered in UC Medications - No data to display  Initial Impression / Assessment and Plan / UC Course  I have reviewed the triage vital signs and the nursing notes.  Pertinent labs & imaging results that were available during my care of the patient were reviewed by me and considered in my medical decision making (see chart for details).     Reviewed importance of quarantine until test results are available Final Clinical Impressions(s) / UC Diagnoses   Final diagnoses:  Viral URI with cough  Exposure to COVID-19 virus     Discharge Instructions     Go home to rest Drink plenty of fluids Take Tylenol for pain or fever You may take over-the-counter cough and cold medicines as needed You must quarantine at home until your test result is available You can check for your test result in MyChart    ED Prescriptions    None     PDMP not reviewed this encounter.   Eustace Moore, MD 02/23/20 2055

## 2020-02-24 LAB — SARS CORONAVIRUS 2 (TAT 6-24 HRS): SARS Coronavirus 2: NEGATIVE

## 2020-03-24 DIAGNOSIS — Z113 Encounter for screening for infections with a predominantly sexual mode of transmission: Secondary | ICD-10-CM | POA: Diagnosis not present

## 2020-03-24 DIAGNOSIS — B373 Candidiasis of vulva and vagina: Secondary | ICD-10-CM | POA: Diagnosis not present

## 2020-03-25 DIAGNOSIS — F321 Major depressive disorder, single episode, moderate: Secondary | ICD-10-CM | POA: Diagnosis not present

## 2020-04-08 DIAGNOSIS — Z7189 Other specified counseling: Secondary | ICD-10-CM | POA: Diagnosis not present

## 2020-04-08 DIAGNOSIS — N898 Other specified noninflammatory disorders of vagina: Secondary | ICD-10-CM | POA: Diagnosis not present

## 2020-04-14 DIAGNOSIS — F321 Major depressive disorder, single episode, moderate: Secondary | ICD-10-CM | POA: Diagnosis not present

## 2020-04-15 DIAGNOSIS — F321 Major depressive disorder, single episode, moderate: Secondary | ICD-10-CM | POA: Diagnosis not present

## 2020-04-22 ENCOUNTER — Emergency Department (HOSPITAL_COMMUNITY): Payer: Medicaid Other

## 2020-04-22 ENCOUNTER — Emergency Department (HOSPITAL_COMMUNITY)
Admission: EM | Admit: 2020-04-22 | Discharge: 2020-04-22 | Disposition: A | Discharge status: Home / Self Care | Payer: Medicaid Other | Attending: Emergency Medicine | Admitting: Emergency Medicine

## 2020-04-22 ENCOUNTER — Encounter (HOSPITAL_COMMUNITY): Payer: Self-pay | Admitting: Emergency Medicine

## 2020-04-22 ENCOUNTER — Other Ambulatory Visit: Payer: Self-pay

## 2020-04-22 DIAGNOSIS — U071 COVID-19: Secondary | ICD-10-CM

## 2020-04-22 DIAGNOSIS — J45909 Unspecified asthma, uncomplicated: Secondary | ICD-10-CM | POA: Diagnosis not present

## 2020-04-22 DIAGNOSIS — J189 Pneumonia, unspecified organism: Secondary | ICD-10-CM | POA: Diagnosis not present

## 2020-04-22 DIAGNOSIS — R509 Fever, unspecified: Secondary | ICD-10-CM | POA: Diagnosis not present

## 2020-04-22 DIAGNOSIS — R9431 Abnormal electrocardiogram [ECG] [EKG]: Secondary | ICD-10-CM | POA: Diagnosis not present

## 2020-04-22 DIAGNOSIS — F17210 Nicotine dependence, cigarettes, uncomplicated: Secondary | ICD-10-CM | POA: Diagnosis not present

## 2020-04-22 LAB — I-STAT BETA HCG BLOOD, ED (MC, WL, AP ONLY): I-stat hCG, quantitative: <5 m[IU]/mL (ref <5)

## 2020-04-22 LAB — COMPREHENSIVE METABOLIC PANEL
ALT: 17 U/L (ref 0–44)
AST: 23 U/L (ref 15–41)
Albumin: 3.8 g/dL (ref 3.5–5.0)
Alkaline Phosphatase: 64 U/L (ref 38–126)
Anion gap: 10 (ref 5–15)
BUN: 7 mg/dL (ref 6–20)
CO2: 23 mmol/L (ref 22–32)
Calcium: 8.9 mg/dL (ref 8.9–10.3)
Chloride: 101 mmol/L (ref 98–111)
Creatinine, Ser: 0.68 mg/dL (ref 0.44–1.00)
GFR calc non Af Amer: >60 mL/min (ref >60)
Glucose, Bld: 92 mg/dL (ref 70–99)
Potassium: 3.4 mmol/L — ABNORMAL LOW (ref 3.5–5.1)
Sodium: 134 mmol/L — ABNORMAL LOW (ref 135–145)
Total Bilirubin: 0.4 mg/dL (ref 0.3–1.2)
Total Protein: 6.8 g/dL (ref 6.5–8.1)

## 2020-04-22 LAB — URINALYSIS, ROUTINE W REFLEX MICROSCOPIC
Bilirubin Urine: NEGATIVE
Glucose, UA: NEGATIVE mg/dL
Hgb urine dipstick: NEGATIVE
Ketones, ur: NEGATIVE mg/dL
Leukocytes,Ua: NEGATIVE
Nitrite: NEGATIVE
Protein, ur: NEGATIVE mg/dL
Specific Gravity, Urine: 1.016 (ref 1.005–1.030)
pH: 6 (ref 5.0–8.0)

## 2020-04-22 LAB — CBC WITH DIFFERENTIAL/PLATELET
Abs Immature Granulocytes: 0.02 10*3/uL (ref 0.00–0.07)
Basophils Absolute: 0 10*3/uL (ref 0.0–0.1)
Basophils Relative: 1 %
Eosinophils Absolute: 0 10*3/uL (ref 0.0–0.5)
Eosinophils Relative: 1 %
HCT: 38.5 % (ref 36.0–46.0)
Hemoglobin: 13 g/dL (ref 12.0–15.0)
Immature Granulocytes: 1 %
Lymphocytes Relative: 13 %
Lymphs Abs: 0.3 10*3/uL — ABNORMAL LOW (ref 0.7–4.0)
MCH: 31 pg (ref 26.0–34.0)
MCHC: 33.8 g/dL (ref 30.0–36.0)
MCV: 91.7 fL (ref 80.0–100.0)
Monocytes Absolute: 0.4 10*3/uL (ref 0.1–1.0)
Monocytes Relative: 16 %
Neutro Abs: 1.7 10*3/uL (ref 1.7–7.7)
Neutrophils Relative %: 68 %
Platelets: 211 10*3/uL (ref 150–400)
RBC: 4.2 MIL/uL (ref 3.87–5.11)
RDW: 13.1 % (ref 11.5–15.5)
WBC: 2.5 10*3/uL — ABNORMAL LOW (ref 4.0–10.5)
nRBC: 0 % (ref 0.0–0.2)

## 2020-04-22 LAB — RESPIRATORY PANEL BY RT PCR (FLU A&B, COVID)
Influenza A by PCR: NEGATIVE
Influenza B by PCR: NEGATIVE
SARS Coronavirus 2 by RT PCR: POSITIVE — AB

## 2020-04-22 MED ORDER — KETOROLAC TROMETHAMINE 15 MG/ML IJ SOLN
15.0000 mg | Freq: Once | INTRAMUSCULAR | Status: AC
  Administered 2020-04-22: 15 mg via INTRAVENOUS
  Filled 2020-04-22: qty 1

## 2020-04-22 MED ORDER — KETOROLAC TROMETHAMINE 60 MG/2ML IM SOLN: 30.0000 mg | Freq: Once | INTRAMUSCULAR | Status: DC

## 2020-04-22 NOTE — ED Provider Notes (Signed)
[place] [facility] Provider Note   CSN: [id number] Arrival date & time: 04/22/20  1016     History Chief Complaint  Patient presents with  . Fever  . Flank Pain    Theresa [name] is a 29 y.o. female.  Presents to ER with concern for chills, body aches, headaches since yesterday, low-grade temperature at home.  Today also having mild nonproductive cough.  Generalized pain, achy pain, no alleviating or aggravating factors.  No burning with urination, no blood in urine.  No abdominal pain.  Unsure of any Covid exposures.  Past medical history asthma, GERD, obesity.  HPI     Past Medical History:  Diagnosis Date  . Achalasia   . Anxiety   . Asthma   . Chlamydia age 50  . Depression   . GERD (gastroesophageal reflux disease)   . Left fibular fracture 12/2011   referred by ED to outpt ortho. non-surgical mgt.   . Obesity (BMI 30-39.9) 07/2013    Patient Active Problem List   Diagnosis Date Noted  . Screening breast examination 03/20/2019  . Breast lump on left side at 3 o'clock position 03/20/2019  . Esophageal obstruction due to food impaction   . Achalasia 03/24/2015  . 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 Status post repeat low transverse cesarean section 03/23/2015  . Cesarean delivery delivered 03/23/2015  . H/O: depression 11/24/2014  . H/O: C-section - 2010 - NRFHRT 11/23/2014  . Breast lump 11/23/2014  . Infection due to enterococcus - at [facility] - treated 11/23/2014  . Vomiting 08/06/2013  . Tobacco abuse 08/06/2013  . 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 BOTOX INJECTION N/A 01/26/2014   Procedure: BOTOX INJECTION;  Surgeon: Lafayette Dragon, MD;  Location: [facility];  Service: Endoscopy;  Laterality: N/A;  . CESAREAN SECTION  12/2008  . CESAREAN SECTION N/A 03/23/2015   Procedure: CESAREAN SECTION;  Surgeon: Delsa Bern, MD;   Location: [facility];  Service: Obstetrics;  Laterality: N/A;  . ESOPHAGEAL MANOMETRY N/A 08/18/2013   Procedure: ESOPHAGEAL MANOMETRY (EM);  Surgeon: Lafayette Dragon, MD;  Location: [facility];  Service: Endoscopy;  Laterality: N/A;  . ESOPHAGOGASTRODUODENOSCOPY N/A 01/26/2014   Procedure: ESOPHAGOGASTRODUODENOSCOPY (EGD);  Surgeon: Lafayette Dragon, MD;  Location: [facility];  Service: Endoscopy;  Laterality: N/A;  . ESOPHAGOGASTRODUODENOSCOPY N/A 03/25/2015   Procedure:  EGD with Botox;  Surgeon: Milus Banister, MD;  Location: [facility];  Service: Endoscopy;  Laterality: N/A;  . ESOPHAGOGASTRODUODENOSCOPY N/A 11/01/2015   Procedure: ESOPHAGOGASTRODUODENOSCOPY (EGD);  Surgeon: Jackolyn Confer, MD;  Location: [facility];  Service: General;  Laterality: N/A;  . ESOPHAGOGASTRODUODENOSCOPY N/A 05/08/2017   Procedure: ESOPHAGOGASTRODUODENOSCOPY (EGD);  Surgeon: Gatha Mayer, MD;  Location: [facility] END[facility]COPY;  Service: Endoscopy;  Laterality: N/A;  . ESOPHAGOGASTRODUODENOSCOPY (EGD) WITH ESOPHAGEAL DILATION N/A 08/06/2013   Procedure: ESOPHAGOGASTRODUODENOSCOPY (EGD) WITH ESOPHAGEAL DILATION;  Surgeon: Lafayette Dragon, MD;  Location: [place];  Service: Endoscopy;  Laterality: N/A;  . FOREIGN BODY REMOVAL N/A 05/08/2017   Procedure: FOREIGN BODY REMOVAL;  Surgeon: Gatha Mayer, MD;  Location: [place];  Service: Endoscopy;  Laterality: N/A;  . HELLER MYOTOMY N/A 11/01/2015   Procedure: LAPAROSCOPIC HELLER MYOTOMY AND [place] FUNDOPLICATION;  Surgeon: Jackolyn Confer, MD;  Location: [facility];  Service: General;  Laterality: N/A;  . TONSILLECTOMY  07/2007   Dr Radene Journey     OB History  Gravida  3   Para  2   Term  2   Preterm      AB  1   Living  2     SAB      TAB  1   Ectopic      Multiple  0   Live Births  2           Family History  Problem Relation Age of Onset  . Hypertension Other   . Colon cancer Neg Hx   . Esophageal cancer Neg Hx   . Rectal cancer Neg Hx   .  Stomach cancer Neg Hx   . Colon polyps Neg Hx     Social History   Tobacco Use  . Smoking status: Current Every Day Smoker    Packs/day: 0.25    Types: Cigarettes    Last attempt to quit: 10/24/2014    Years since quitting: 5.4  . Smokeless tobacco: Never Used  Vaping Use  . Vaping Use: Never used  Substance Use Topics  . Alcohol use: Yes    Comment: occassionally  . Drug use: No    Home Medications Prior to Admission medications   Medication Sig Start Date End Date Taking? Authorizing Provider  ibuprofen (ADVIL) 800 MG tablet Take 1 tablet (800 mg total) by mouth 3 (three) times daily with meals. 11/12/19   Vanessa Kick, MD  clonazePAM (KLONOPIN) 1 MG tablet Take 1 mg by mouth daily.  03/20/17 11/12/19  [provider]  Desvenlafaxine Succinate ER 25 MG TB24 Take 50 mg by mouth daily as needed (anxiety).  05/29/17 11/12/19  [provider]  fluticasone (FLONASE) 50 MCG/ACT nasal spray Place 1 spray into both nostrils daily. 06/06/18 11/12/19  Charlann Lange, PA-C  omeprazole (PRILOSEC) 40 MG capsule Take one pill 30 minutes before breakfast every morning. 05/16/17 11/12/19  Milus Banister, MD    Allergies    Patient has no known allergies.  Review of Systems   Review of Systems  Constitutional: Positive for fatigue and fever. Negative for chills.  HENT: Negative for ear pain and sore throat.   Eyes: Negative for pain and visual disturbance.  Respiratory: Positive for cough. Negative for shortness of breath.   Cardiovascular: Negative for chest pain and palpitations.  Gastrointestinal: Negative for abdominal pain and vomiting.  Genitourinary: Negative for dysuria and hematuria.  Musculoskeletal: Positive for arthralgias, back pain and myalgias.  Skin: Negative for color change and rash.  Neurological: Negative for seizures and syncope.  All other systems reviewed and are negative.   Physical Exam Updated Vital Signs BP (!) 145/89 (BP Location: Right Arm)    Pulse (!) 111   Temp (!) 100.6 F (38.1 C) (Oral)   Resp 20   Ht 5' 4"  (1.626 m)   Wt 81.6 kg   SpO2 99%   BMI 30.90 kg/m   Physical Exam Vitals and nursing note reviewed.  Constitutional:      General: She is not in acute distress.    Appearance: She is well-developed.  HENT:     Head: Normocephalic and atraumatic.  Eyes:     Conjunctiva/sclera: Conjunctivae normal.  Cardiovascular:     Rate and Rhythm: Regular rhythm. Tachycardia present.     Pulses: Normal pulses.     Heart sounds: No murmur heard.   Pulmonary:     Effort: Pulmonary effort is normal. No respiratory distress.     Breath sounds: Normal breath sounds.  Abdominal:  Palpations: Abdomen is soft.     Tenderness: There is no abdominal tenderness.  Musculoskeletal:        General: No deformity or signs of injury. Normal range of motion.     Cervical back: Neck supple. No rigidity.  Skin:    General: Skin is warm and dry.     Capillary Refill: Capillary refill takes less than 2 seconds.  Neurological:     General: No focal deficit present.     Mental Status: She is alert and oriented to person, place, and time.  Psychiatric:        Mood and Affect: Mood normal.        Behavior: Behavior normal.     ED Results / Procedures / Treatments   Labs (all labs ordered are listed, but only abnormal results are displayed) Labs Reviewed  RESPIRATORY PANEL BY RT PCR (FLU A&B, COVID) - Abnormal; Notable for the following components:      Result Value   SARS Coronavirus 2 by RT PCR POSITIVE (*)    All other components within normal limits  CBC WITH DIFFERENTIAL/PLATELET - Abnormal; Notable for the following components:   WBC 2.5 (*)    Lymphs Abs 0.3 (*)    All other components within normal limits  COMPREHENSIVE METABOLIC PANEL - Abnormal; Notable for the following components:   Sodium 134 (*)    Potassium 3.4 (*)    All other components within normal limits  URINALYSIS, ROUTINE W REFLEX MICROSCOPIC   I-STAT BETA HCG BLOOD, ED ([facility], [facility], [facility] ONLY)    EKG EKG Interpretation  Date/Time:  Thursday April 22 2020 10:29:35 EDT Ventricular Rate:  95 PR Interval:  140 QRS Duration: 62 QT Interval:  320 QTC Calculation: 402 R Axis:   86 Text Interpretation: Normal sinus rhythm with sinus arrhythmia Normal ECG Confirmed by Madalyn Rob [phone]) on 04/22/2020 11:17:09 AM   Radiology DG Chest Portable 1 View  Result Date: 04/22/2020 CLINICAL DATA:  Shortness of breath, fever EXAM: PORTABLE CHEST 1 VIEW COMPARISON:  2018 FINDINGS: Slight interstitial prominence. No pleural effusion or pneumothorax. Cardiomediastinal contours are within normal limits with normal heart size. IMPRESSION: Slight interstitial prominence is nonspecific and could reflect atypical/viral pneumonia. Electronically Signed   By: Macy Mis M.D.   On: 04/22/2020 11:28    Procedures Procedures (including critical care time)  Medications Ordered in ED Medications  ketorolac (TORADOL) 15 MG/ML injection 15 mg (15 mg Intravenous Given 04/22/20 1213)    ED Course  I have reviewed the triage vital signs and the nursing notes.  Pertinent labs & imaging results that were available during my care of the patient were reviewed by me and considered in my medical decision making (see chart for details).    MDM Rules/Calculators/A&P                         29 year old girl presenting to ER with myriad complaints including cough, myalgias, chills, pain.  On exam she is well-appearing in no distress, low-grade temperature.  CXR negative, radiologist commented on slight interstitial prominence concerning for atypical/viral pneumonia.  Covid test is positive.  Suspect all of her symptoms are related to Covid.  She has no tachypnea or hypoxia, no increased work of breathing, she is appropriate for outpatient management at this time.  Will give information for monoclonal antibody infusion event her BMI.  Reviewed return  precautions.   After the discussed management above, the patient was determined  to be safe for discharge.  The patient was in agreement with this plan and all questions regarding their care were answered.  ED return precautions were discussed and the patient will return to the ED with any significant worsening of condition.   Theresa [name] was evaluated in Emergency Department on 04/22/2020 for the symptoms described in the history of present illness. She was evaluated in the context of the global COVID-19 pandemic, which necessitated consideration that the patient might be at risk for infection with the SARS-CoV-2 virus that causes COVID-19. Institutional protocols and algorithms that pertain to the evaluation of patients at risk for COVID-19 are in a state of rapid change based on information released by regulatory bodies including the CDC and federal and state organizations. These policies and algorithms were followed during the patient's care in the ED.    Final Clinical Impression(s) / ED Diagnoses Final diagnoses:  [id number]    Rx / DC Orders ED Discharge Orders    None       Lucrezia Starch, MD 04/22/20 1303

## 2020-04-22 NOTE — Discharge Instructions (Signed)
Recommend follow-up with your primary doctor as well as with the Covid infusion clinic.  If you develop any difficulty in breathing, worsening chest pain or other new concerning symptom, return to ER for reassessment.  Recommend Tylenol Motrin as needed for fevers and pain control.

## 2020-04-22 NOTE — ED Notes (Signed)
Reviewed discharge instructions with patient. Follow-up care reviewed. Patient verbalized understanding. Patient A&Ox4, VSS, and ambulatory with steady gait upon discharge.  

## 2020-04-22 NOTE — ED Triage Notes (Signed)
Pt arrives to ED with complaints of fever, chills, body aches, headaches, lower back pain, and bilateral flank since yesterday.

## 2020-04-23 ENCOUNTER — Ambulatory Visit (HOSPITAL_COMMUNITY): Payer: Medicaid Other

## 2020-04-23 ENCOUNTER — Telehealth: Payer: Self-pay | Admitting: *Deleted

## 2020-04-23 ENCOUNTER — Other Ambulatory Visit: Payer: Self-pay | Admitting: Physician Assistant

## 2020-04-23 ENCOUNTER — Encounter: Payer: Self-pay | Admitting: Physician Assistant

## 2020-04-23 DIAGNOSIS — J45909 Unspecified asthma, uncomplicated: Secondary | ICD-10-CM | POA: Insufficient documentation

## 2020-04-23 DIAGNOSIS — U071 COVID-19: Secondary | ICD-10-CM

## 2020-04-23 DIAGNOSIS — E669 Obesity, unspecified: Secondary | ICD-10-CM

## 2020-04-23 DIAGNOSIS — Z720 Tobacco use: Secondary | ICD-10-CM

## 2020-04-23 NOTE — Progress Notes (Signed)
I connected by phone with Bard Herbert on 04/23/2020 at 9:05 AM to discuss the potential use of a new treatment for mild to moderate COVID-19 viral infection in non-hospitalized patients.  This patient is a 29 y.o. female that meets the FDA criteria for Emergency Use Authorization of COVID monoclonal antibody casirivimab/imdevimab.  Has a (+) direct SARS-CoV-2 viral test result  Has mild or moderate COVID-19   Is NOT hospitalized due to COVID-19  Is within 10 days of symptom onset  Has at least one of the high risk factor(s) for progression to severe COVID-19 and/or hospitalization as defined in EUA.  Specific high risk criteria : BMI > 25, Cardiovascular disease or hypertension, Chronic Lung Disease and Other high risk medical condition per CDC:  high social vulnerabiltity index   I have spoken and communicated the following to the patient or parent/caregiver regarding COVID monoclonal antibody treatment:  1. FDA has authorized the emergency use for the treatment of mild to moderate COVID-19 in adults and pediatric patients with positive results of direct SARS-CoV-2 viral testing who are 60 years of age and older weighing at least 40 kg, and who are at high risk for progressing to severe COVID-19 and/or hospitalization.  2. The significant known and potential risks and benefits of COVID monoclonal antibody, and the extent to which such potential risks and benefits are unknown.  3. Information on available alternative treatments and the risks and benefits of those alternatives, including clinical trials.  4. Patients treated with COVID monoclonal antibody should continue to self-isolate and use infection control measures (e.g., wear mask, isolate, social distance, avoid sharing personal items, clean and disinfect "high touch" surfaces, and frequent handwashing) according to CDC guidelines.   5. The patient or parent/caregiver has the option to accept or refuse COVID monoclonal antibody  treatment.  After reviewing this information with the patient, the patient has agreed to receive one of the available covid 19 monoclonal antibodies and will be provided an appropriate fact sheet prior to infusion.  Sx onset 10/6. Set up for infusion on 10/8 @ 10:30am. Directions given to [facility]. Pt is aware that insurance will be charged an infusion fee. Pt is unvaccinated.   Cline Crock 04/23/2020 9:05 AM

## 2020-04-23 NOTE — Telephone Encounter (Signed)
Called patient to assist with follow up ,no answer left message. °Theresa [name] °PEC [phone]

## 2020-04-23 NOTE — Telephone Encounter (Signed)
Transition Care Management Follow-up Telephone Call  Date of discharge and from where: 04/22/20 [facility]  How have you been since you were released from the hospital? "Doing well, going to get antibodies infusion today."  Any questions or concerns? No  Items Reviewed:  Did the pt receive and understand the discharge instructions provided? Yes   Medications obtained and verified? Yes   Any new allergies since your discharge? No   Dietary orders reviewed? Yes  Do you have support at home? Yes   Functional Questionnaire: (I = Independent and D = Dependent) ADLs: I  Bathing/Dressing- I  Meal Prep- I  Eating- I  Maintaining continence- I  Transferring/Ambulation- I  Managing Meds- I  Follow up appointments reviewed:   PCP Hospital f/u appt confirmed? No  PEC Team to address  Specialist Hospital f/u appt confirmed? No    Are transportation arrangements needed? No   If their condition worsens, is the pt aware to call PCP or go to the Emergency Dept.? Yes  Was the patient provided with contact information for the PCP's office or ED? Yes  Was to pt encouraged to call back with questions or concerns? Yes

## 2020-04-29 ENCOUNTER — Ambulatory Visit: Payer: Medicaid Other | Admitting: Nurse Practitioner

## 2020-05-06 ENCOUNTER — Ambulatory Visit: Payer: Medicaid Other

## 2020-05-06 DIAGNOSIS — Z113 Encounter for screening for infections with a predominantly sexual mode of transmission: Secondary | ICD-10-CM | POA: Diagnosis not present

## 2020-05-06 DIAGNOSIS — N760 Acute vaginitis: Secondary | ICD-10-CM | POA: Diagnosis not present

## 2020-05-06 DIAGNOSIS — L732 Hidradenitis suppurativa: Secondary | ICD-10-CM | POA: Diagnosis not present

## 2020-06-18 ENCOUNTER — Other Ambulatory Visit: Payer: Self-pay

## 2020-06-18 ENCOUNTER — Ambulatory Visit (HOSPITAL_COMMUNITY)
Admission: EM | Admit: 2020-06-18 | Discharge: 2020-06-18 | Disposition: A | Discharge status: Home / Self Care | Payer: Medicaid Other | Attending: Internal Medicine | Admitting: Internal Medicine

## 2020-06-18 ENCOUNTER — Encounter (HOSPITAL_COMMUNITY): Payer: Self-pay | Admitting: Emergency Medicine

## 2020-06-18 DIAGNOSIS — Z1152 Encounter for screening for COVID-19: Secondary | ICD-10-CM | POA: Diagnosis not present

## 2020-06-18 LAB — SARS CORONAVIRUS 2 (TAT 6-24 HRS): SARS Coronavirus 2: NEGATIVE

## 2020-06-18 NOTE — ED Triage Notes (Signed)
Pt presents for COVID testing for event tomorrow. Denies any symptoms at this time.

## 2020-06-18 NOTE — Discharge Instructions (Signed)

## 2020-06-24 DIAGNOSIS — F321 Major depressive disorder, single episode, moderate: Secondary | ICD-10-CM | POA: Diagnosis not present

## 2020-07-01 DIAGNOSIS — N898 Other specified noninflammatory disorders of vagina: Secondary | ICD-10-CM | POA: Diagnosis not present

## 2020-07-21 DIAGNOSIS — L293 Anogenital pruritus, unspecified: Secondary | ICD-10-CM | POA: Diagnosis not present

## 2020-07-21 DIAGNOSIS — L292 Pruritus vulvae: Secondary | ICD-10-CM | POA: Diagnosis not present

## 2020-09-02 ENCOUNTER — Ambulatory Visit (HOSPITAL_COMMUNITY)
Admission: EM | Admit: 2020-09-02 | Discharge: 2020-09-02 | Disposition: A | Discharge status: Home / Self Care | Payer: Medicaid Other | Attending: Student | Admitting: Student

## 2020-09-02 ENCOUNTER — Encounter (HOSPITAL_COMMUNITY): Payer: Self-pay

## 2020-09-02 DIAGNOSIS — Z113 Encounter for screening for infections with a predominantly sexual mode of transmission: Secondary | ICD-10-CM | POA: Diagnosis not present

## 2020-09-02 DIAGNOSIS — N761 Subacute and chronic vaginitis: Secondary | ICD-10-CM | POA: Diagnosis not present

## 2020-09-02 DIAGNOSIS — B3731 Acute candidiasis of vulva and vagina: Secondary | ICD-10-CM

## 2020-09-02 DIAGNOSIS — J3089 Other allergic rhinitis: Secondary | ICD-10-CM | POA: Insufficient documentation

## 2020-09-02 DIAGNOSIS — B373 Candidiasis of vulva and vagina: Secondary | ICD-10-CM | POA: Insufficient documentation

## 2020-09-02 DIAGNOSIS — H65111 Acute and subacute allergic otitis media (mucoid) (sanguinous) (serous), right ear: Secondary | ICD-10-CM

## 2020-09-02 DIAGNOSIS — Z8616 Personal history of COVID-19: Secondary | ICD-10-CM

## 2020-09-02 LAB — HIV ANTIBODY (ROUTINE TESTING W REFLEX): HIV Screen 4th Generation wRfx: NONREACTIVE

## 2020-09-02 MED ORDER — CETIRIZINE HCL 10 MG PO TABS: 10.0000 mg | ORAL_TABLET | Freq: Every day | ORAL | 2 refills | Status: DC

## 2020-09-02 MED ORDER — FLUCONAZOLE 150 MG PO TABS: 150.0000 mg | ORAL_TABLET | Freq: Every day | ORAL | 0 refills | Status: DC

## 2020-09-02 MED ORDER — AMOXICILLIN-POT CLAVULANATE 875-125 MG PO TABS: 1.0000 | ORAL_TABLET | Freq: Two times a day (BID) | ORAL | 0 refills | Status: DC

## 2020-09-02 NOTE — ED Provider Notes (Signed)
[facility]    CSN: [id number] Arrival date & time: 09/02/20  6010      History   Chief Complaint Chief Complaint  Patient presents with  . Otalgia    HPI Theresa [name] is a 30 y.o. female presenting with a constellation of symptoms.  History of achalasia, anxiety, asthma, chlamydia, depression, GERD, chronic vaginal discharge, obesity, breast lump, tobacco abuse.  -States she has had right ear pain for 2 weeks.  Endorses pressure, muffled hearing.  Denies dizziness, headaches, tinnitus, URI symptoms.  Does state that she just started a new job, and there is lot of dust there and she is blowing her nose a lot. -States she was diagnosed with Covid few months ago. Felt better for >3 months before current symptoms started.  -States she has a long history of vaginal discharge, states she gets BV at least once a month.  States she may need a biopsy of her vagina for this.  Denies current vaginal discharge, abdominal pain, pelvic pain, urinary symptoms-requesting full STI panel. -New partner since last STI screening; requesting full STI panel. Denies hematuria, dysuria, frequency, urgency, back pain, n/v/d/abd pain, fevers/chills, abdnormal vaginal discharge.  -Patient with seasonal allergies, exacerbation since starting new job.  Taking no medication for this.  HPI  Past Medical History:  Diagnosis Date  . Achalasia   . Anxiety   . Asthma   . Chlamydia age 18  . Depression   . GERD (gastroesophageal reflux disease)   . Left fibular fracture 12/2011   referred by ED to outpt ortho. non-surgical mgt.   . Obesity (BMI 30-39.9) 07/2013    Patient Active Problem List   Diagnosis Date Noted  . Asthma   . Screening breast examination 03/20/2019  . Breast lump on left side at 3 o'clock position 03/20/2019  . Esophageal obstruction due to food impaction   . Achalasia 03/24/2015  . 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 Status post repeat low transverse cesarean section 03/23/2015  .  Cesarean delivery delivered 03/23/2015  . H/O: depression 11/24/2014  . H/O: C-section - 2010 - NRFHRT 11/23/2014  . Breast lump 11/23/2014  . Infection due to enterococcus - at [facility] - treated 11/23/2014  . Vomiting 08/06/2013  . Tobacco abuse 08/06/2013  . 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 Obesity (BMI 30-39.9) 07/2013    Past Surgical History:  Procedure Laterality Date  . BOTOX INJECTION N/A 01/26/2014   Procedure: BOTOX INJECTION;  Surgeon: Hart Carwin, MD;  Location: [facility];  Service: Endoscopy;  Laterality: N/A;  . CESAREAN SECTION  12/2008  . CESAREAN SECTION N/A 03/23/2015   Procedure: CESAREAN SECTION;  Surgeon: Silverio Lay, MD;  Location: [facility];  Service: Obstetrics;  Laterality: N/A;  . ESOPHAGEAL MANOMETRY N/A 08/18/2013   Procedure: ESOPHAGEAL MANOMETRY (EM);  Surgeon: Hart Carwin, MD;  Location: [facility];  Service: Endoscopy;  Laterality: N/A;  . ESOPHAGOGASTRODUODENOSCOPY N/A 01/26/2014   Procedure: ESOPHAGOGASTRODUODENOSCOPY (EGD);  Surgeon: Hart Carwin, MD;  Location: [facility];  Service: Endoscopy;  Laterality: N/A;  . ESOPHAGOGASTRODUODENOSCOPY N/A 03/25/2015   Procedure:  EGD with Botox;  Surgeon: [name], MD;  Location: [facility];  Service: Endoscopy;  Laterality: N/A;  . ESOPHAGOGASTRODUODENOSCOPY N/A 11/01/2015   Procedure: ESOPHAGOGASTRODUODENOSCOPY (EGD);  Surgeon: Avel Peace, MD;  Location: [facility];  Service: General;  Laterality: N/A;  . ESOPHAGOGASTRODUODENOSCOPY N/A 05/08/2017   Procedure: ESOPHAGOGASTRODUODENOSCOPY (EGD);  Surgeon: Iva Boop, MD;  Location:  [facility];  Service: Endoscopy;  Laterality: N/A;  . ESOPHAGOGASTRODUODENOSCOPY (EGD) WITH ESOPHAGEAL DILATION N/A 08/06/2013   Procedure: ESOPHAGOGASTRODUODENOSCOPY (EGD) WITH ESOPHAGEAL DILATION;  Surgeon: Hart Carwinora M Brodie, MD;  [facility];  Service: Endoscopy;  Laterality: N/A;  . FOREIGN BODY  REMOVAL N/A 05/08/2017   Procedure: FOREIGN BODY REMOVAL;  Surgeon: Iva BoopGessner, Carl E, MD;  [facility];  Service: Endoscopy;  Laterality: N/A;  . HELLER MYOTOMY N/A 11/01/2015   Procedure: LAPAROSCOPIC HELLER MYOTOMY AND DOR FUNDOPLICATION;  Surgeon: Avel Peaceodd Rosenbower, MD;  Location: [facility];  Service: General;  Laterality: N/A;  . TONSILLECTOMY  07/2007   Dr Narda Bondshris Newman    OB History    Gravida  3   Para  2   Term  2   Preterm      AB  1   Living  2     SAB      IAB  1   Ectopic      Multiple  0   Live Births  2            Home Medications    Prior to Admission medications   Medication Sig Start Date End Date Taking? Authorizing Provider  amoxicillin-clavulanate (AUGMENTIN) 875-125 MG tablet Take 1 tablet by mouth every 12 (twelve) hours. 09/02/20  Yes Rhys MartiniGraham, Tylerjames Hoglund E, PA-C  cetirizine (ZYRTEC ALLERGY) 10 MG tablet Take 1 tablet (10 mg total) by mouth daily. 09/02/20  Yes Rhys MartiniGraham, Oretha Weismann E, PA-C  fluconazole (DIFLUCAN) 150 MG tablet Take 1 tablet (150 mg total) by mouth daily. Take one pill today. If you develop symptoms, take the second pill in 3 days. 09/02/20  Yes Rhys MartiniGraham, Aaidyn San E, PA-C  ibuprofen (ADVIL) 800 MG tablet Take 1 tablet (800 mg total) by mouth 3 (three) times daily with meals. 11/12/19   Mardella LaymanHagler, Brian, MD  clonazePAM (KLONOPIN) 1 MG tablet Take 1 mg by mouth daily.  03/20/17 11/12/19  [provider]  Desvenlafaxine Succinate ER 25 MG TB24 Take 50 mg by mouth daily as needed (anxiety).  05/29/17 11/12/19  [provider]  fluticasone (FLONASE) 50 MCG/ACT nasal spray Place 1 spray into both nostrils daily. 06/06/18 11/12/19  Elpidio AnisUpstill, Shari, PA-C  omeprazole (PRILOSEC) 40 MG capsule Take one pill 30 minutes before breakfast every morning. 05/16/17 11/12/19  Rachael FeeJacobs, Daniel P, MD    Family History Family History  Problem Relation Age of Onset  . Hypertension Other   . Colon cancer Neg Hx   . Esophageal cancer Neg Hx   . Rectal cancer  Neg Hx   . Stomach cancer Neg Hx   . Colon polyps Neg Hx     Social History Social History   Tobacco Use  . Smoking status: Current Every Day Smoker    Packs/day: 0.25    Types: Cigarettes    Last attempt to quit: 10/24/2014    Years since quitting: 5.8  . Smokeless tobacco: Never Used  Vaping Use  . Vaping Use: Never used  Substance Use Topics  . Alcohol use: Yes    Comment: occassionally  . Drug use: No     Allergies   Patient has no known allergies.   Review of Systems Review of Systems  Constitutional: Negative for appetite change, chills and fever.  HENT: Positive for ear pain. Negative for congestion, rhinorrhea, sinus pressure, sinus pain and sore throat.   Eyes: Negative for redness and visual disturbance.  Respiratory: Negative for cough, chest tightness, shortness of breath and wheezing.  Cardiovascular: Negative for chest pain and palpitations.  Gastrointestinal: Negative for abdominal pain, constipation, diarrhea, nausea and vomiting.  Genitourinary: Negative for dysuria, frequency and urgency.  Musculoskeletal: Negative for myalgias.  Neurological: Negative for dizziness, weakness and headaches.  Psychiatric/Behavioral: Negative for confusion.  All other systems reviewed and are negative.    Physical Exam Triage Vital Signs ED Triage Vitals  Enc Vitals Group     BP 09/02/20 0834 121/80     Pulse Rate 09/02/20 0834 87     Resp 09/02/20 0834 20     Temp 09/02/20 0834 (!) 97.4 F (36.3 C)     Temp src --      SpO2 09/02/20 0834 100 %     Weight --      Height --      Head Circumference --      Peak Flow --      Pain Score 09/02/20 0832 5     Pain Loc --      Pain Edu? --      Excl. in GC? --    No data found.  Updated Vital Signs BP 121/80   Pulse 87   Temp (!) 97.4 F (36.3 C)   Resp 20   LMP 08/23/2020   SpO2 100%   Visual Acuity Right Eye Distance:   Left Eye Distance:   Bilateral Distance:    Right Eye Near:   Left Eye  Near:    Bilateral Near:     Physical Exam Vitals reviewed.  Constitutional:      General: She is not in acute distress.    Appearance: Normal appearance. She is not ill-appearing.  HENT:     Head: Normocephalic and atraumatic.     Right Ear: Hearing, ear canal and external ear normal. No swelling or tenderness. There is no impacted cerumen. No mastoid tenderness. Tympanic membrane is erythematous and bulging. Tympanic membrane is not perforated or retracted.     Left Ear: Hearing, tympanic membrane, ear canal and external ear normal. No swelling or tenderness. There is no impacted cerumen. No mastoid tenderness. Tympanic membrane is not perforated, erythematous, retracted or bulging.     Nose:     Right Sinus: No maxillary sinus tenderness or frontal sinus tenderness.     Left Sinus: No maxillary sinus tenderness or frontal sinus tenderness.     Mouth/Throat:     Mouth: Mucous membranes are moist.     Pharynx: Uvula midline. No oropharyngeal exudate or posterior oropharyngeal erythema.     Tonsils: No tonsillar exudate.  Cardiovascular:     Rate and Rhythm: Normal rate and regular rhythm.     Heart sounds: Normal heart sounds.  Pulmonary:     Breath sounds: Normal breath sounds and air entry. No wheezing, rhonchi or rales.  Chest:     Chest wall: No tenderness.  Abdominal:     General: Abdomen is flat. Bowel sounds are normal.     Tenderness: There is no abdominal tenderness. There is no right CVA tenderness, left CVA tenderness, guarding or rebound.  Lymphadenopathy:     Cervical: No cervical adenopathy.  Neurological:     General: No focal deficit present.     Mental Status: She is alert and oriented to person, place, and time.  Psychiatric:        Attention and Perception: Attention and perception normal.        Mood and Affect: Mood and affect normal.  Behavior: Behavior normal. Behavior is cooperative.        Thought Content: Thought content normal.        Judgment:  Judgment normal.      UC Treatments / Results  Labs (all labs ordered are listed, but only abnormal results are displayed) Labs Reviewed - No data to display  EKG   Radiology No results found.  Procedures Procedures (including critical care time)  Medications Ordered in UC Medications - No data to display  Initial Impression / Assessment and Plan / UC Course  I have reviewed the triage vital signs and the nursing notes.  Pertinent labs & imaging results that were available during my care of the patient were reviewed by me and considered in my medical decision making (see chart for details).     This patient is a 29 year old female presenting with a constellation of symptoms.  -For acute suppurative otitis media-start Augmentin as below. -Patient with long history of vaginal candidiasis following antibiotics; start Diflucan as below -We are screening for gonorrhea, chlamydia, trichomonas, HIV, syphilis, BV, yeast today.  She denies current vaginal discharge or abdominal pain.  Abstain until negative result. -For allergic rhinitis, start Zyrtec as below.   -Continue to follow with gynecologist for your chronic vaginal discharge. -Strict return precautions- If you develop abdominal pain, fevers chills, new vaginal discharge, back pain, pelvic pain, etc. please seek immediate medical attention -This patient had covid-19 3 months ago per pt. covid test deferred as per CDC guidelines.   Spent over 60 minutes obtaining H&P, performing physical, discussing results, treatment plan and plan for follow-up with patient. Patient agrees with plan.   This chart was dictated using voice recognition software, Dragon. Despite the best efforts of this provider to proofread and correct errors, errors may still occur which can change documentation meaning.   Final Clinical Impressions(s) / UC Diagnoses   Final diagnoses:  Routine screening for STI (sexually transmitted infection)  Chronic  vaginitis  Non-recurrent acute allergic otitis media of right ear  Seasonal allergic rhinitis due to other allergic trigger  History of COVID-19  Vaginal candida     Discharge Instructions     -For your ear infection, start the Augmentin.  This is 2 pills daily, like with breakfast and dinner.  Take this for 7 days. -To prevent yeast, start the Diflucan.  This is 1 pill today, and if you develop symptoms, take the second pill on day 3. -We are screening for gonorrhea, chlamydia, trichomonas, HIV, syphilis, BV, yeast today.  If anything is positive, we can send additional treatment at that time. -For your allergies, please start Zyrtec.  Take 1 pill daily for at least a week to see if this helps you.  He can continue it if it helps -Continue to follow with gynecologist for your chronic vaginal discharge. -If you develop abdominal pain, fevers chills, new vaginal discharge, back pain, pelvic pain, etc. please seek immediate medical attention   ED Prescriptions    Medication Sig Dispense Auth. Provider   amoxicillin-clavulanate (AUGMENTIN) 875-125 MG tablet Take 1 tablet by mouth every 12 (twelve) hours. 14 tablet Rhys Martini, PA-C   fluconazole (DIFLUCAN) 150 MG tablet Take 1 tablet (150 mg total) by mouth daily. Take one pill today. If you develop symptoms, take the second pill in 3 days. 2 tablet Rhys Martini, PA-C   cetirizine (ZYRTEC ALLERGY) 10 MG tablet Take 1 tablet (10 mg total) by mouth daily. 30 tablet Rhys Martini, PA-C  PDMP not reviewed this encounter.   Rhys Martini, PA-C 09/02/20 1132

## 2020-09-02 NOTE — Discharge Instructions (Signed)
-  For your ear infection, start the Augmentin.  This is 2 pills daily, like with breakfast and dinner.  Take this for 7 days. -To prevent yeast, start the Diflucan.  This is 1 pill today, and if you develop symptoms, take the second pill on day 3. -We are screening for gonorrhea, chlamydia, trichomonas, HIV, syphilis, BV, yeast today.  If anything is positive, we can send additional treatment at that time. -For your allergies, please start Zyrtec.  Take 1 pill daily for at least a week to see if this helps you.  He can continue it if it helps -Continue to follow with gynecologist for your chronic vaginal discharge. -If you develop abdominal pain, fevers chills, new vaginal discharge, back pain, pelvic pain, etc. please seek immediate medical attention

## 2020-09-02 NOTE — ED Triage Notes (Signed)
Pt also disclosed that she would like hiv testing

## 2020-09-02 NOTE — ED Triage Notes (Signed)
Pt presents with c/o right ear pain for past 2 weeks, has had some drainage, now pain radiates into neck

## 2020-09-03 ENCOUNTER — Telehealth (HOSPITAL_COMMUNITY): Payer: Self-pay | Admitting: Emergency Medicine

## 2020-09-03 LAB — CERVICOVAGINAL ANCILLARY ONLY
Bacterial Vaginitis (gardnerella): POSITIVE — AB
Candida Glabrata: NEGATIVE
Candida Vaginitis: NEGATIVE
Chlamydia: NEGATIVE
Comment: NEGATIVE
Comment: NEGATIVE
Comment: NEGATIVE
Comment: NEGATIVE
Comment: NEGATIVE
Comment: NORMAL
Neisseria Gonorrhea: NEGATIVE
Trichomonas: NEGATIVE

## 2020-09-03 LAB — RPR: RPR Ser Ql: NONREACTIVE

## 2020-09-03 NOTE — Telephone Encounter (Signed)
Patient decided she did not want the medications we use for protocol, states she will reach out to her OB

## 2020-09-03 NOTE — Progress Notes (Signed)
Per protocol, patient will need treatment with Flagyl.   Reahced out to patient who does not want any of our current options for protocol tr`eatments.  States she will reach out to her OB/Gyn.

## 2020-09-07 ENCOUNTER — Telehealth (HOSPITAL_COMMUNITY): Payer: Self-pay | Admitting: Internal Medicine

## 2020-09-07 MED ORDER — TINIDAZOLE 500 MG PO TABS: 2.0000 g | ORAL_TABLET | Freq: Every day | ORAL | 0 refills | Status: AC

## 2020-09-07 MED ORDER — BORIC ACID VAGINAL 600 MG VA SUPP: 1.0000 | Freq: Every day | VAGINAL | 0 refills | Status: AC

## 2020-09-07 NOTE — Telephone Encounter (Signed)
Patient has a history of recurrent BV.  Donepezil 2 g orally daily for 2 days has been prescribed.  Patient is advised to start using boric acid vaginally at bedtime over the next couple of weeks.  Follow-up with gynecologist recommended.  Prescriptions have been sent to the pharmacy on file.

## 2020-11-17 DIAGNOSIS — L0231 Cutaneous abscess of buttock: Secondary | ICD-10-CM | POA: Diagnosis not present

## 2020-12-03 DIAGNOSIS — Z20822 Contact with and (suspected) exposure to covid-19: Secondary | ICD-10-CM | POA: Diagnosis not present

## 2021-01-27 ENCOUNTER — Encounter (HOSPITAL_COMMUNITY): Payer: Self-pay

## 2021-01-27 ENCOUNTER — Ambulatory Visit (INDEPENDENT_AMBULATORY_CARE_PROVIDER_SITE_OTHER): Payer: Medicaid Other

## 2021-01-27 ENCOUNTER — Ambulatory Visit (HOSPITAL_COMMUNITY)
Admission: EM | Admit: 2021-01-27 | Discharge: 2021-01-27 | Disposition: A | Discharge status: Home / Self Care | Payer: Medicaid Other | Attending: Emergency Medicine | Admitting: Emergency Medicine

## 2021-01-27 ENCOUNTER — Other Ambulatory Visit: Payer: Self-pay

## 2021-01-27 DIAGNOSIS — M7989 Other specified soft tissue disorders: Secondary | ICD-10-CM | POA: Diagnosis not present

## 2021-01-27 DIAGNOSIS — S93402A Sprain of unspecified ligament of left ankle, initial encounter: Secondary | ICD-10-CM | POA: Diagnosis not present

## 2021-01-27 DIAGNOSIS — M25572 Pain in left ankle and joints of left foot: Secondary | ICD-10-CM | POA: Diagnosis not present

## 2021-01-27 MED ORDER — IBUPROFEN 800 MG PO TABS: 800.0000 mg | ORAL_TABLET | Freq: Three times a day (TID) | ORAL | 0 refills | Status: DC

## 2021-01-27 NOTE — ED Triage Notes (Signed)
Pt presents with left foot and ankle pain & swelling since yesterday that is non injury related.

## 2021-01-27 NOTE — ED Provider Notes (Signed)
[facility]    CSN: [id number] Arrival date & time: 01/27/21  1943      History   Chief Complaint Chief Complaint  Patient presents with   Ankle Pain    HPI Theresa [name] is a 30 y.o. female.   Patient here for left ankle pain and swelling that started yesterday.  Denies any known injury but reports pain and swelling have gotten increasingly worse.  Reports unable to move toes due to pain.  Has not taken any OTC medication or treatment.  Denies any trauma, injury, or other precipitating event.  Denies any fevers, chest pain, shortness of breath, N/V/D, numbness, tingling, weakness, abdominal pain, or headaches.     The history is provided by the patient.  Ankle Pain  Past Medical History:  Diagnosis Date   Achalasia    Anxiety    Asthma    Chlamydia age 4   Depression    GERD (gastroesophageal reflux disease)    Left fibular fracture 12/2011   referred by ED to outpt ortho. non-surgical mgt.    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   Status post repeat low transverse cesarean section 03/23/2015   Cesarean delivery delivered 03/23/2015   H/O: depression 11/24/2014   H/O: C-section - 2010 - NRFHRT 11/23/2014   Breast lump 11/23/2014   Infection due to enterococcus - at [facility] - treated 11/23/2014   Vomiting 08/06/2013   Tobacco abuse 08/06/2013   Achalasia, esophageal s/p laparoscopic Heller myotomy and Dor fundoplication 11/01/15 08/06/2013   Obesity (BMI 30-39.9) 07/2013    Past Surgical History:  Procedure Laterality Date   BOTOX INJECTION N/A 01/26/2014   Procedure: BOTOX INJECTION;  Surgeon: Hart Carwin, MD;  Location: [facility];  Service: Endoscopy;  Laterality: N/A;   CESAREAN SECTION   12/2008   CESAREAN SECTION N/A 03/23/2015   Procedure: CESAREAN SECTION;  Surgeon: Silverio Lay, MD;  Location: [facility];  Service: Obstetrics;  Laterality: N/A;   ESOPHAGEAL MANOMETRY N/A 08/18/2013   Procedure: ESOPHAGEAL MANOMETRY (EM);  Surgeon: Hart Carwin, MD;  Location: [facility];  Service: Endoscopy;  Laterality: N/A;   ESOPHAGOGASTRODUODENOSCOPY N/A 01/26/2014   Procedure: ESOPHAGOGASTRODUODENOSCOPY (EGD);  Surgeon: Hart Carwin, MD;  Location: [facility];  Service: Endoscopy;  Laterality: N/A;   ESOPHAGOGASTRODUODENOSCOPY N/A 03/25/2015   Procedure:  EGD with Botox;  Surgeon: [name], MD;  Location: [facility];  Service: Endoscopy;  Laterality: N/A;   ESOPHAGOGASTRODUODENOSCOPY N/A 11/01/2015   Procedure: ESOPHAGOGASTRODUODENOSCOPY (EGD);  Surgeon: Avel Peace, MD;  Location: [facility];  Service: General;  Laterality: N/A;   ESOPHAGOGASTRODUODENOSCOPY N/A 05/08/2017   Procedure: ESOPHAGOGASTRODUODENOSCOPY (EGD);  Surgeon: Iva Boop, MD;  Location: [facility] ENDOSCOPY;  Service: Endoscopy;  Laterality: N/A;   ESOPHAGOGASTRODUODENOSCOPY (EGD) WITH ESOPHAGEAL DILATION N/A 08/06/2013   Procedure: ESOPHAGOGASTRODUODENOSCOPY (EGD) WITH ESOPHAGEAL DILATION;  Surgeon: Hart Carwin, MD;  Location: [facility] ENDOSCOPY;  Service: Endoscopy;  Laterality: N/A;   FOREIGN BODY REMOVAL N/A 05/08/2017   Procedure: FOREIGN BODY REMOVAL;  Surgeon: Iva Boop, MD;  Location: [facility] ENDOSCOPY;  Service: Endoscopy;  Laterality: N/A;   HELLER MYOTOMY N/A 11/01/2015   Procedure: LAPAROSCOPIC HELLER MYOTOMY AND DOR FUNDOPLICATION;  Surgeon: Avel Peace,  MD;  Location: [facility];  Service: General;  Laterality: N/A;   TONSILLECTOMY  07/2007   Dr Narda Bondshris Newman    OB History     Gravida  3   Para  2   Term  2   Preterm      AB  1   Living  2      SAB      IAB  1   Ectopic      Multiple  0   Live Births  2            Home Medications    Prior to Admission medications    Medication Sig Start Date End Date Taking? Authorizing Provider  ibuprofen (ADVIL) 800 MG tablet Take 1 tablet (800 mg total) by mouth 3 (three) times daily. 01/27/21  Yes Ivette LoyalSmith, Baljit Liebert R, NP  amoxicillin-clavulanate (AUGMENTIN) 875-125 MG tablet Take 1 tablet by mouth every 12 (twelve) hours. 09/02/20   Rhys MartiniGraham, Laura E, PA-C  cetirizine (ZYRTEC ALLERGY) 10 MG tablet Take 1 tablet (10 mg total) by mouth daily. 09/02/20   Rhys MartiniGraham, Laura E, PA-C  fluconazole (DIFLUCAN) 150 MG tablet Take 1 tablet (150 mg total) by mouth daily. Take one pill today. If you develop symptoms, take the second pill in 3 days. 09/02/20   Rhys MartiniGraham, Laura E, PA-C  clonazePAM (KLONOPIN) 1 MG tablet Take 1 mg by mouth daily.  03/20/17 11/12/19  [provider]  Desvenlafaxine Succinate ER 25 MG TB24 Take 50 mg by mouth daily as needed (anxiety).  05/29/17 11/12/19  [provider]  fluticasone (FLONASE) 50 MCG/ACT nasal spray Place 1 spray into both nostrils daily. 06/06/18 11/12/19  Elpidio AnisUpstill, Shari, PA-C  omeprazole (PRILOSEC) 40 MG capsule Take one pill 30 minutes before breakfast every morning. 05/16/17 11/12/19  Rachael FeeJacobs, Daniel P, MD    Family History Family History  Problem Relation Age of Onset   Hypertension Other    Colon cancer Neg Hx    Esophageal cancer Neg Hx    Rectal cancer Neg Hx    Stomach cancer Neg Hx    Colon polyps Neg Hx     Social History Social History   Tobacco Use   Smoking status: Every Day    Packs/day: 0.25    Types: Cigarettes    Last attempt to quit: 10/24/2014    Years since quitting: 6.2   Smokeless tobacco: Never  Vaping Use   Vaping Use: Never used  Substance Use Topics   Alcohol use: Yes    Comment: occassionally   Drug use: No     Allergies   Patient has no known allergies.   Review of Systems Review of Systems  Musculoskeletal:  Positive for arthralgias and joint swelling.  All other systems reviewed and are negative.   Physical Exam Triage Vital  Signs ED Triage Vitals [01/27/21 2023]  Enc Vitals Group     BP 112/60     Pulse Rate 72     Resp 18     Temp 98.3 F (36.8 C)     Temp Source Oral     SpO2 99 %     Weight      Height      Head Circumference      Peak Flow      Pain Score 9     Pain Loc      Pain Edu?      Excl. in GC?    No data found.  Updated Vital  Signs BP 112/60 (BP Location: Left Arm)   Pulse 72   Temp 98.3 F (36.8 C) (Oral)   Resp 18   LMP 01/26/2021   SpO2 99%   Visual Acuity Right Eye Distance:   Left Eye Distance:   Bilateral Distance:    Right Eye Near:   Left Eye Near:    Bilateral Near:     Physical Exam Vitals and nursing note reviewed.  Constitutional:      General: She is not in acute distress.    Appearance: Normal appearance. She is not ill-appearing, toxic-appearing or diaphoretic.  HENT:     Head: Normocephalic and atraumatic.  Eyes:     Conjunctiva/sclera: Conjunctivae normal.  Cardiovascular:     Rate and Rhythm: Normal rate.     Pulses: Normal pulses.  Pulmonary:     Effort: Pulmonary effort is normal.  Abdominal:     General: Abdomen is flat.  Musculoskeletal:     Cervical back: Normal range of motion.     Right ankle: Normal range of motion.     Left ankle: Swelling present. Tenderness present. Decreased range of motion (due to pain). Anterior drawer test negative. Normal pulse.     Left Achilles Tendon: Normal.     Right foot: Normal.     Left foot: Normal.       Legs:  Skin:    General: Skin is warm and dry.  Neurological:     General: No focal deficit present.     Mental Status: She is alert and oriented to person, place, and time.  Psychiatric:        Mood and Affect: Mood normal.     UC Treatments / Results  Labs (all labs ordered are listed, but only abnormal results are displayed) Labs Reviewed - No data to display  EKG   Radiology DG Ankle Complete Left  Result Date: 01/27/2021 CLINICAL DATA:  Ankle pain and swelling EXAM: LEFT  ANKLE COMPLETE - 3+ VIEW COMPARISON:  None. FINDINGS: Mild calcaneal spurring is seen. No acute fracture or dislocation is noted. No soft tissue abnormality is seen. IMPRESSION: No acute abnormality noted. Electronically Signed   By: Alcide Clever M.D.   On: 01/27/2021 21:14    Procedures Procedures (including critical care time)  Medications Ordered in UC Medications - No data to display  Initial Impression / Assessment and Plan / UC Course  I have reviewed the triage vital signs and the nursing notes.  Pertinent labs & imaging results that were available during my care of the patient were reviewed by me and considered in my medical decision making (see chart for details).    Assessment negative for red flags or concerns.  X-ray with no acute bony abnormality.  Possible sprain to left ankle.  ASO splint applied in office.  May use Ace bandage or ASO splint for comfort.  Ibuprofen 3 times a day as needed for pain and swelling.  Recommend rest, ice, compression, and elevation.  Follow-up with orthopedics for reevaluation if symptoms do not improve in the next week. Final Clinical Impressions(s) / UC Diagnoses   Final diagnoses:  Sprain of left ankle, unspecified ligament, initial encounter     Discharge Instructions      Take the Ibuprofen three times a day as needed for pain.  You can also take Tylenol as needed for pain relief and fever reduction.    Rest as much as possible Ice for 10-15 minutes every 4-6 hours as needed for  pain and swelling Compression- use an ace bandage or splint for comfort Elevate above your hip/heart when sitting and laying down  Follow up with sports medicine or orthopedics if symptoms do not improve in the next week.      ED Prescriptions     Medication Sig Dispense Auth. Provider   ibuprofen (ADVIL) 800 MG tablet Take 1 tablet (800 mg total) by mouth 3 (three) times daily. 30 tablet Ivette Loyal, NP      PDMP not reviewed this encounter.    Ivette Loyal, NP 01/27/21 2128

## 2021-01-27 NOTE — Discharge Instructions (Addendum)
Take the Ibuprofen three times a day as needed for pain.  You can also take Tylenol as needed for pain relief and fever reduction.    Rest as much as possible Ice for 10-15 minutes every 4-6 hours as needed for pain and swelling Compression- use an ace bandage or splint for comfort Elevate above your hip/heart when sitting and laying down  Follow up with sports medicine or orthopedics if symptoms do not improve in the next week.

## 2021-05-07 ENCOUNTER — Encounter (HOSPITAL_COMMUNITY): Payer: Self-pay | Admitting: Emergency Medicine

## 2021-05-07 ENCOUNTER — Other Ambulatory Visit: Payer: Self-pay

## 2021-05-07 ENCOUNTER — Emergency Department (HOSPITAL_COMMUNITY)
Admission: EM | Admit: 2021-05-07 | Discharge: 2021-05-07 | Disposition: A | Discharge status: Home / Self Care | Payer: Medicaid Other | Attending: Emergency Medicine | Admitting: Emergency Medicine

## 2021-05-07 DIAGNOSIS — Z202 Contact with and (suspected) exposure to infections with a predominantly sexual mode of transmission: Secondary | ICD-10-CM

## 2021-05-07 DIAGNOSIS — Z7951 Long term (current) use of inhaled steroids: Secondary | ICD-10-CM | POA: Diagnosis not present

## 2021-05-07 DIAGNOSIS — F17210 Nicotine dependence, cigarettes, uncomplicated: Secondary | ICD-10-CM | POA: Insufficient documentation

## 2021-05-07 DIAGNOSIS — J45909 Unspecified asthma, uncomplicated: Secondary | ICD-10-CM | POA: Insufficient documentation

## 2021-05-07 DIAGNOSIS — N898 Other specified noninflammatory disorders of vagina: Secondary | ICD-10-CM | POA: Insufficient documentation

## 2021-05-07 LAB — URINALYSIS, ROUTINE W REFLEX MICROSCOPIC
Bacteria, UA: NONE SEEN
Bilirubin Urine: NEGATIVE
Glucose, UA: NEGATIVE mg/dL
Hgb urine dipstick: NEGATIVE
Ketones, ur: NEGATIVE mg/dL
Leukocytes,Ua: NEGATIVE
Nitrite: NEGATIVE
Protein, ur: NEGATIVE mg/dL
Specific Gravity, Urine: 1.024 (ref 1.005–1.030)
pH: 7 (ref 5.0–8.0)

## 2021-05-07 LAB — WET PREP, GENITAL
Clue Cells Wet Prep HPF POC: NONE SEEN
Sperm: NONE SEEN
Trich, Wet Prep: NONE SEEN
WBC, Wet Prep HPF POC: <10 — AB
Yeast Wet Prep HPF POC: NONE SEEN

## 2021-05-07 LAB — HIV ANTIBODY (ROUTINE TESTING W REFLEX): HIV Screen 4th Generation wRfx: NONREACTIVE

## 2021-05-07 LAB — PREGNANCY, URINE: Preg Test, Ur: NEGATIVE

## 2021-05-07 NOTE — ED Provider Notes (Signed)
[facility] Provider Note   CSN: [id number] Arrival date & time: 05/07/21  8338     History Chief Complaint  Patient presents with   Exposure to STD    Theresa [name] is a 30 y.o. female.  With past medical history of recurrent BV who presents emergency department with vaginal discharge.  She states that since she stopped her period on 04/29/2021 she has had increasing vaginal itching and discharge.  She states that she struggles with recurrent BV that usually comes on after her periods.  States that this feels like her typical BV symptoms.  She also notes that she had unprotected intercourse on Thursday, 05/05/2021.  She states that she is concerned about STDs.  She states that she knows a friend of the individual she had sex with who was recently diagnosed with HIV and would like to be tested.  She states that she asked the individual she had sex with if he had STDs in he stated no.  Not on birth control  She denies fevers, weight loss, night sweats, lymphadenopathy, new rashes, nausea, vomiting diarrhea.  She denies abdominal pain, pelvic pain, denies urinary symptoms  HPI     Past Medical History:  Diagnosis Date   Achalasia    Anxiety    Asthma    Chlamydia age 83   Depression    GERD (gastroesophageal reflux disease)    Left fibular fracture 12/2011   referred by ED to outpt ortho. non-surgical mgt.    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   Status post repeat low transverse cesarean section 03/23/2015   Cesarean delivery delivered 03/23/2015   H/O: depression 11/24/2014   H/O: C-section - 2010 - NRFHRT 11/23/2014   Breast lump 11/23/2014   Infection due to enterococcus - at [facility]  - treated 11/23/2014   Vomiting 08/06/2013   Tobacco abuse 08/06/2013   Achalasia, esophageal s/p laparoscopic Heller myotomy and Dor fundoplication 11/01/15 08/06/2013   Obesity (BMI 30-39.9) 07/2013    Past Surgical History:  Procedure Laterality Date   BOTOX INJECTION N/A 01/26/2014   Procedure: BOTOX INJECTION;  Surgeon: Hart Carwin, MD;  Location: [facility];  Service: Endoscopy;  Laterality: N/A;   CESAREAN SECTION  12/2008   CESAREAN SECTION N/A 03/23/2015   Procedure: CESAREAN SECTION;  Surgeon: Silverio Lay, MD;  Location: [facility];  Service: Obstetrics;  Laterality: N/A;   ESOPHAGEAL MANOMETRY N/A 08/18/2013   Procedure: ESOPHAGEAL MANOMETRY (EM);  Surgeon: Hart Carwin, MD;  Location: [facility];  Service: Endoscopy;  Laterality: N/A;   ESOPHAGOGASTRODUODENOSCOPY N/A 01/26/2014   Procedure: ESOPHAGOGASTRODUODENOSCOPY (EGD);  Surgeon: Hart Carwin, MD;  Location: [facility];  Service: Endoscopy;  Laterality: N/A;   ESOPHAGOGASTRODUODENOSCOPY N/A 03/25/2015   Procedure:  EGD with Botox;  Surgeon: [name], MD;  Location: [facility];  Service: Endoscopy;  Laterality: N/A;   ESOPHAGOGASTRODUODENOSCOPY N/A 11/01/2015   Procedure: ESOPHAGOGASTRODUODENOSCOPY (EGD);  Surgeon: Avel Peace, MD;  Location: [facility];  Service: General;  Laterality: N/A;   ESOPHAGOGASTRODUODENOSCOPY N/A 05/08/2017   Procedure: ESOPHAGOGASTRODUODENOSCOPY (EGD);  Surgeon: Iva Boop, MD;  Location: [facility] END[facility]COPY;  Service: Endoscopy;  Laterality: N/A;   ESOPHAGOGASTRODUODENOSCOPY (EGD) WITH ESOPHAGEAL  DILATION N/A 08/06/2013   Procedure: ESOPHAGOGASTRODUODENOSCOPY (EGD) WITH ESOPHAGEAL DILATION;  Surgeon: Hart Carwin, MD;  Location: [facility] ENDOSCOPY;  Service: Endoscopy;  Laterality: N/A;   FOREIGN BODY REMOVAL N/A 05/08/2017   Procedure: FOREIGN BODY REMOVAL;  Surgeon: Iva Boop, MD;  Location: [facility] ENDOSCOPY;  Service: Endoscopy;  Laterality: N/A;   HELLER MYOTOMY N/A 11/01/2015   Procedure:  LAPAROSCOPIC HELLER MYOTOMY AND DOR FUNDOPLICATION;  Surgeon: Avel Peace, MD;  Location: [facility];  Service: General;  Laterality: N/A;   TONSILLECTOMY  07/2007   Dr Narda Bonds     OB History     Gravida  3   Para  2   Term  2   Preterm      AB  1   Living  2      SAB      IAB  1   Ectopic      Multiple  0   Live Births  2           Family History  Problem Relation Age of Onset   Hypertension Other    Colon cancer Neg Hx    Esophageal cancer Neg Hx    Rectal cancer Neg Hx    Stomach cancer Neg Hx    Colon polyps Neg Hx     Social History   Tobacco Use   Smoking status: Every Day    Packs/day: 0.25    Types: Cigarettes    Last attempt to quit: 10/24/2014    Years since quitting: 6.5   Smokeless tobacco: Never  Vaping Use   Vaping Use: Never used  Substance Use Topics   Alcohol use: Yes    Comment: occassionally   Drug use: No    Home Medications Prior to Admission medications   Medication Sig Start Date End Date Taking? Authorizing Provider  amoxicillin-clavulanate (AUGMENTIN) 875-125 MG tablet Take 1 tablet by mouth every 12 (twelve) hours. 09/02/20   Rhys Martini, PA-C  cetirizine (ZYRTEC ALLERGY) 10 MG tablet Take 1 tablet (10 mg total) by mouth daily. 09/02/20   Rhys Martini, PA-C  fluconazole (DIFLUCAN) 150 MG tablet Take 1 tablet (150 mg total) by mouth daily. Take one pill today. If you develop symptoms, take the second pill in 3 days. 09/02/20   Rhys Martini, PA-C  ibuprofen (ADVIL) 800 MG tablet Take 1 tablet (800 mg total) by mouth 3 (three) times daily. 01/27/21   Ivette Loyal, NP  clonazePAM (KLONOPIN) 1 MG tablet Take 1 mg by mouth daily.  03/20/17 11/12/19  [provider]  Desvenlafaxine Succinate ER 25 MG TB24 Take 50 mg by mouth daily as needed (anxiety).  05/29/17 11/12/19  [provider]  fluticasone (FLONASE) 50 MCG/ACT nasal spray Place 1 spray into both nostrils daily. 06/06/18 11/12/19  Elpidio Anis, PA-C  omeprazole (PRILOSEC) 40 MG capsule Take one pill 30 minutes before breakfast every morning. 05/16/17 11/12/19  [name], MD    Allergies    Patient has no known allergies.  Review of Systems   Review of Systems  Constitutional:  Negative for fever and unexpected weight change.  Gastrointestinal:  Negative for abdominal pain.  Genitourinary:  Positive for vaginal discharge. Negative for genital sores.  Skin:  Negative for rash.  Allergic/Immunologic: Negative for immunocompromised state.  All other systems reviewed and are negative.  Physical Exam Updated Vital Signs BP 122/76   Pulse 83   Temp 98 F (36.7 C) (Oral)  Resp 18   Ht 5\' 4"  (1.626 m)   Wt 83.9 kg   LMP 04/29/2021 (Exact Date)   SpO2 100%   BMI 31.76 kg/m   Physical Exam Vitals and nursing note reviewed. Exam conducted with a chaperone present.  Constitutional:      General: She is not in acute distress.    Appearance: Normal appearance. She is not toxic-appearing.  HENT:     Head: Normocephalic and atraumatic.     Mouth/Throat:     Mouth: Mucous membranes are moist.     Pharynx: Oropharynx is clear.  Eyes:     General: No scleral icterus.    Pupils: Pupils are equal, round, and reactive to light.  Cardiovascular:     Rate and Rhythm: Normal rate and regular rhythm.     Pulses: Normal pulses.     Heart sounds: No murmur heard. Pulmonary:     Effort: Pulmonary effort is normal. No respiratory distress.     Breath sounds: Normal breath sounds.  Abdominal:     General: Bowel sounds are normal. There is no distension.     Palpations: Abdomen is soft.     Tenderness: There is no abdominal tenderness.  Genitourinary:    General: Normal vulva.     Exam position: Lithotomy position.     Pubic Area: No rash.      Vagina: Normal. No tenderness, bleeding or lesions.     Cervix: No cervical motion tenderness, discharge, erythema or cervical bleeding.     Uterus: Not tender.      Adnexa:         Right: No tenderness.         Left: No tenderness.       Comments: Pelvic exam performed with chaperone present. Speculum used to examine the vagina which appears normal without erythema, lesions, bleeding.  Able to visualize cervix.  The cervix with os closed.  There is a scant amount of white discharge that appears physiologic.  There are no lesions at the cervix. The perineum is without any lesions, chancre, redness or swelling. CMT, adnexal tenderness.   Musculoskeletal:        General: Normal range of motion.     Cervical back: Normal range of motion.  Lymphadenopathy:     Cervical: No cervical adenopathy.  Skin:    General: Skin is warm and dry.     Capillary Refill: Capillary refill takes less than 2 seconds.     Findings: No rash.  Neurological:     General: No focal deficit present.     Mental Status: She is alert and oriented to person, place, and time. Mental status is at baseline.  Psychiatric:        Mood and Affect: Mood normal.        Behavior: Behavior normal.    ED Results / Procedures / Treatments   Labs (all labs ordered are listed, but only abnormal results are displayed) Labs Reviewed  WET PREP, GENITAL - Abnormal; Notable for the following components:      Result Value   WBC, Wet Prep HPF POC <10 (*)    All other components within normal limits  PREGNANCY, URINE  URINALYSIS, ROUTINE W REFLEX MICROSCOPIC  RPR  HIV ANTIBODY (ROUTINE TESTING W REFLEX)  GC/CHLAMYDIA PROBE AMP ([place]) NOT AT [facility]    EKG None  Radiology No results found.  Procedures Procedures   Medications Ordered in ED Medications - No data to display  ED Course  I have reviewed the triage vital signs and the nursing notes.  Pertinent labs & imaging results that were available during my care of the patient were reviewed by me and considered in my medical decision making (see chart for details).    MDM Rules/Calculators/A&P 30 year old female who presents  emergency department with HPI, physical exam and work-up as described above.  She has no abdominal pain, pelvic pain, CMT or adnexal tenderness so doubt ovarian torsion, TOA, PID. Not pregnant so doubt ectopic No urinary symptoms, UA negative so doubt this is contributing to symptoms Her presentations not consistent with appendicitis, diverticulitis, cystitis, pyelonephritis or other acute abdominal pathologies.  Wet prep negative.  Spoke with her at bedside regarding empiric treatment for gonorrhea and chlamydia. She declines wanting to be empirically treated for either. I have discussed with her the importance of being treated for STDs and possible sequelae of untreated STDs. She again declines Rocephin/Doxy.  She asked for morning-after pill and I discussed with her that we do not provide that here.  Also discussed that her GC/chlamydia, HIV and RPR will not result today.  She understands that she will be called if any of these are positive.  She verbalized understanding.  I again asked her about being empirically treated for STDs and she declined. Work-up has been reassuring here. Her vital signs are stable and she is safe for discharge Final Clinical Impression(s) / ED Diagnoses Final diagnoses:  Vaginal discharge    Rx / DC Orders ED Discharge Orders     None        Cristopher Peru, PA-C 05/07/21 1407    Mancel Bale, MD 05/08/21 [phone]

## 2021-05-07 NOTE — ED Notes (Signed)
Attempted for blood x2 by EMT and once by RN, unsuccessful.

## 2021-05-07 NOTE — ED Triage Notes (Signed)
Complains she has been itching and scratching at her private area, states she thinks this is BV, has had it in the past, these are her symptoms. States she had intercourse Friday around 1-2 AM, unprotected. Would like STD testing and the morning after pill.

## 2021-05-07 NOTE — ED Provider Notes (Signed)
Emergency Medicine Provider Triage Evaluation Note  Theresa [name] , a 30 y.o. female  was evaluated in triage.  Pt complains of vaginal discharge. States that she has recurrent BV after her menstrual periods which stopped on 04/29/2021.  She states that since then she has had itching and increasing discharge from her vagina.  She states that it feels like BV.  She also states that she had unprotected intercourse on Thursday.  She states that she is concerned about STDs.  She states she knows a friend of this individual who recently was diagnosed with HIV and would also like to be tested for that.  Review of Systems  Positive: Vaginal discharge, vaginal itching Negative: Rash, abdominal pain, fever, weight loss, noticing any new lymphadenopathy  Physical Exam  BP 127/67 (BP Location: Right Arm)   Pulse 77   Temp 98 F (36.7 C) (Oral)   Resp 16   Ht 5\' 4"  (1.626 m)   Wt 83.9 kg   LMP 04/29/2021 (Exact Date)   SpO2 99%   BMI 31.76 kg/m  Gen:   Awake, no distress   Resp:  Normal effort MSK:   Moves extremities without difficulty  Other:    Medical Decision Making  Medically screening exam initiated at 9:49 AM.  Appropriate orders placed.  Theresa [name] was informed that the remainder of the evaluation will be completed by another provider, this initial triage assessment does not replace that evaluation, and the importance of remaining in the ED until their evaluation is complete.     Bard Herbert, PA-C 05/07/21 05/09/21    [id number], MD 05/08/21 [phone]

## 2021-05-07 NOTE — Discharge Instructions (Addendum)
You were seen in the emergency department today for having some increased vaginal discharge.  You are also concerned about STDs.  We drew labs and sent swabs for STDs, HIV and syphilis.  Somebody will call you if any of these are positive.  You are swab did not show any BV which is what you are concerned about.  Please follow-up with your primary care provider if you have any ongoing vaginal itching.  If you do not have a primary care provider I have provided you with information for [facility] which is a clinic that you can use for managing your symptoms.

## 2021-05-07 NOTE — ED Notes (Signed)
Blood draw was unsuccessful x2.  Nurse aware.

## 2021-05-08 LAB — RPR: RPR Ser Ql: NONREACTIVE

## 2021-05-09 ENCOUNTER — Telehealth: Payer: Self-pay

## 2021-05-09 DIAGNOSIS — Z9189 Other specified personal risk factors, not elsewhere classified: Secondary | ICD-10-CM

## 2021-05-09 LAB — GC/CHLAMYDIA PROBE AMP (~~LOC~~) NOT AT ARMC
Chlamydia: NEGATIVE
Comment: NEGATIVE
Comment: NORMAL
Neisseria Gonorrhea: NEGATIVE

## 2021-05-09 NOTE — Telephone Encounter (Signed)
Transition Care Management Follow-up Telephone Call Date of discharge and from where: 05/07/2021-[place]  How have you been since you were released from the hospital? Patient stated she is doing fine. Any questions or concerns?  No Items Reviewed: Did the pt receive and understand the discharge instructions provided? Yes  Medications obtained and verified? Yes  Other? No  Any new allergies since your discharge?  Dietary orders revieweNo d? No Do you have support at home? Yes   Home Care and Equipment/Supplies: Were home health services ordered? not applicable If so, what is the name of the agency? N/A  Has the agency set up a time to come to the patient's home? not applicable Were any new equipment or medical supplies ordered?  No What is the name of the medical supply agency? N/A Were you able to get the supplies/equipment? not applicable Do you have any questions related to the use of the equipment or supplies? No  Functional Questionnaire: (I = Independent and D = Dependent) ADLs: I  Bathing/Dressing- I  Meal Prep- I  Eating- I  Maintaining continence- I  Transferring/Ambulation- I  Managing Meds- I  Follow up appointments reviewed:  PCP Hospital f/u appt confirmed? No   Specialist Hospital f/u appt confirmed? No   Are transportation arrangements needed? No  If their condition worsens, is the pt aware to call PCP or go to the Emergency Dept.? Yes Was the patient provided with contact information for the PCP's office or ED? Yes Was to pt encouraged to call back with questions or concerns? Yes  Referral was placed per patient request to get assistance with finding a PCP.

## 2021-05-12 ENCOUNTER — Other Ambulatory Visit: Payer: Self-pay

## 2021-05-12 NOTE — Patient Instructions (Signed)
Visit Information  Ms. Theresa [name] was given information about Medicaid Managed Care team care coordination services as a part of their Healthy Blue Medicaid benefit. Shauntay Brunelli verbally consented to engagement with the [facility] Managed Care team.   If you are experiencing a medical emergency, please call 911 or report to your local emergency department or urgent care.   If you have a non-emergency medical problem during routine business hours, please contact your provider's office and ask to speak with a nurse.   For questions related to your Healthy [facility] health plan, please call: [phone] or visit the homepage here: [email or web address]  If you would like to schedule transportation through your Healthy [facility] plan, please call the following number at least 2 days in advance of your appointment: [phone]  Call the [facility] Crisis Line at [phone], at any time, 24 hours a day, 7 days a week. If you are in danger or need immediate medical attention call 911.  If you would like help to quit smoking, call 1-800-QUIT-NOW (540-781-0242) OR Espaol: 1-855-Djelo-Ya (9-767-341-9379) o para ms informacin haga clic aqu or Text READY to 024-097 to register via text  Ms. Theresa [name] - following are the goals we discussed in your visit today:   Goals Addressed   None     Social Worker will follow up with patient in 30 days.   Gus Puma, BSW, [place] [facility]  [place]  High Risk Managed Medicaid Team  [phone]   Following is a copy of your plan of care:  There are no care plans that you recently modified to display for this patient.

## 2021-05-12 NOTE — Patient Outreach (Addendum)
Medicaid Managed Care Social Work Note  05/12/2021 Name:  Theresa [name] MRN:  [id number] DOB:  06/06/1991  Theresa [name] is an 30 y.o. year old female who is a primary patient of Pcp, No.  The Medicaid Managed Care Coordination team was consulted for assistance with:   PCP  Ms. Vonderhaar was given information about Medicaid Managed Care Coordination team services today. Bard Herbert Patient agreed to services and verbal consent obtained.  Engaged with patient  for by telephone forinitial visit in response to referral for case management and/or care coordination services.   Assessments/Interventions:  Review of past medical history, allergies, medications, health status, including review of consultants reports, laboratory and other test data, was performed as part of comprehensive evaluation and provision of chronic care management services.  SDOH: (Social Determinant of Health) assessments and interventions performed: BSW provided patient with the telephone number to [facility] to set an appointment for a new PCP. Patient states no other services/resources are needed at this time.   Advanced Directives Status:  Not addressed in this encounter.  Care Plan                 Allergies  Allergen Reactions   Metronidazole Itching    Medications Reviewed Today     Reviewed by Harrie Jeans (Pharmacy Technician) on 05/07/21 at 1347  Med List Status: Complete   Medication Order Taking? Sig Documenting Provider Last Dose Status Informant  amoxicillin-clavulanate (AUGMENTIN) 875-125 MG tablet [id number] No Take 1 tablet by mouth every 12 (twelve) hours.  Patient not taking: Reported on 05/07/2021   Rhys Martini, PA-C Not Taking Active Self  cetirizine (ZYRTEC ALLERGY) 10 MG tablet [id number] No Take 1 tablet (10 mg total) by mouth daily.  Patient not taking: Reported on 05/07/2021   Rhys Martini, PA-C Not Taking Active Self  fluconazole (DIFLUCAN) 150 MG  tablet [id number] No Take 1 tablet (150 mg total) by mouth daily. Take one pill today. If you develop symptoms, take the second pill in 3 days.  Patient not taking: Reported on 05/07/2021   Rhys Martini, PA-C Not Taking Active Self  ibuprofen (ADVIL) 800 MG tablet [id number] No Take 1 tablet (800 mg total) by mouth 3 (three) times daily.  Patient not taking: Reported on 05/07/2021   Ivette Loyal, NP Not Taking Active Self  Multiple Vitamin (MULTIVITAMIN) capsule [id number] Yes Take 1 capsule by mouth daily. 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  Follow up:  Patient agrees to Care Plan and Follow-up.  Plan: The Managed Medicaid care management team will reach out to the patient again over the next 14 days.  Date/time of next scheduled Social Work care management/care coordination outreach:  06/01/21  Gus Puma, Kenard Gower, [facility]  [facility]  High Risk Managed Medicaid Team  [phone]

## 2021-06-13 ENCOUNTER — Other Ambulatory Visit: Payer: Self-pay

## 2021-06-13 NOTE — Patient Outreach (Signed)
Medicaid Managed Care Social Work Note  06/13/2021 Name:  Theresa [name] MRN:  [id number] DOB:  [birth year]  Theresa [name] is an 30 y.o. year old female who is a primary patient of Pcp, No.  The Medicaid Managed Care Coordination team was consulted for assistance with:   PCP  Ms. Cure was given information about Medicaid Managed Care Coordination team services today. Bard Herbert Patient agreed to services and verbal consent obtained.  Engaged with patient  for by telephone forfollow up visit in response to referral for case management and/or care coordination services.   Assessments/Interventions:  Review of past medical history, allergies, medications, health status, including review of consultants reports, laboratory and other test data, was performed as part of comprehensive evaluation and provision of chronic care management services.  SDOH: (Social Determinant of Health) assessments and interventions performed: BSW completed a follow up telephone call with patient. She stated she has not had a chance to contact the [facility] to establish care but will contact them today.   Advanced Directives Status:  Not addressed in this encounter.  Care Plan                 Allergies  Allergen Reactions   Metronidazole Itching    Medications Reviewed Today     Reviewed by Harrie Jeans (Pharmacy Technician) on 05/07/21 at 1347  Med List Status: Complete   Medication Order Taking? Sig Documenting Provider Last Dose Status Informant  amoxicillin-clavulanate (AUGMENTIN) 875-125 MG tablet [id number] No Take 1 tablet by mouth every 12 (twelve) hours.  Patient not taking: Reported on 05/07/2021   Rhys Martini, PA-C Not Taking Active Self  cetirizine (ZYRTEC ALLERGY) 10 MG tablet [id number] No Take 1 tablet (10 mg total) by mouth daily.  Patient not taking: Reported on 05/07/2021   Rhys Martini, PA-C Not Taking Active Self  fluconazole (DIFLUCAN) 150 MG  tablet [id number] No Take 1 tablet (150 mg total) by mouth daily. Take one pill today. If you develop symptoms, take the second pill in 3 days.  Patient not taking: Reported on 05/07/2021   Rhys Martini, PA-C Not Taking Active Self  ibuprofen (ADVIL) 800 MG tablet [id number] No Take 1 tablet (800 mg total) by mouth 3 (three) times daily.  Patient not taking: Reported on 05/07/2021   Ivette Loyal, NP Not Taking Active Self  Multiple Vitamin (MULTIVITAMIN) capsule [id number] Yes Take 1 capsule by mouth daily. 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   Follow up:  Patient agrees to Care Plan and Follow-up.  Plan: The Managed Medicaid care management team will reach out to the patient again over the next 4 days.  Date/time of next scheduled Social Work care management/care coordination outreach:  06/16/21  Gus Puma, Kenard Gower, [facility]  [facility]  High Risk Managed Medicaid Team  [phone]

## 2021-06-13 NOTE — Patient Instructions (Signed)
Visit Information  Ms. Theresa [name] was given information about Medicaid Managed Care team care coordination services as a part of their Healthy Blue Medicaid benefit. [name] verbally consented to engagement with the [facility] Managed Care team.   If you are experiencing a medical emergency, please call 911 or report to your local emergency department or urgent care.   If you have a non-emergency medical problem during routine business hours, please contact your provider's office and ask to speak with a nurse.   For questions related to your Healthy [facility] health plan, please call: [phone] or visit the homepage here: [email or web address]  If you would like to schedule transportation through your Healthy [facility] plan, please call the following number at least 2 days in advance of your appointment: [phone]  Call the [facility] Crisis Line at [phone], at any time, 24 hours a day, 7 days a week. If you are in danger or need immediate medical attention call 911.  If you would like help to quit smoking, call 1-800-QUIT-NOW (650-313-0454) OR Espaol: 1-855-Djelo-Ya (7-517-001-7494) o para ms informacin haga clic aqu or Text READY to 496-759 to register via text  [name] - following are the goals we discussed in your visit today:   Goals Addressed   None       Social Worker will follow up with patient on 06/16/21.   Marland Kitchen Gus Puma, BSW, [place] [facility]    High Risk Managed Medicaid Team  [phone]   Following is a copy of your plan of care:  There are no care plans that you recently modified to display for this patient.

## 2021-06-16 ENCOUNTER — Other Ambulatory Visit: Payer: Self-pay

## 2021-06-16 NOTE — Patient Instructions (Signed)
Visit Information  Ms. Theresa [name]  - as a part of your Medicaid benefit, you are eligible for care management and care coordination services at no cost or copay. I was unable to reach you by phone today but would be happy to help you with your health related needs. Please feel free to call me @ [phone]     Gus Puma, BSW, [facility]  [facility]  High Risk Managed Medicaid Team  [phone]

## 2021-06-16 NOTE — Patient Outreach (Signed)
Care Coordination  06/16/2021  Mackenzye Mackel 1990/12/12 [id number]   Medicaid Managed Care   Unsuccessful Outreach Note  06/16/2021 Name: Tonna Palazzi MRN: [id number] DOB: 1991/03/15  Referred by: Pcp, No Reason for referral : High Risk Managed Medicaid (MM Social Work Unsuccessful [employer])   An unsuccessful telephone outreach was attempted today. The patient was referred to the case management team for assistance with care management and care coordination.   Follow Up Plan: The patient has been provided with contact information for the care management team and has been advised to call with any health related questions or concerns.   SIGNATURE

## 2022-04-10 DIAGNOSIS — F332 Major depressive disorder, recurrent severe without psychotic features: Secondary | ICD-10-CM | POA: Diagnosis not present

## 2022-04-17 DIAGNOSIS — F332 Major depressive disorder, recurrent severe without psychotic features: Secondary | ICD-10-CM | POA: Diagnosis not present

## 2022-05-01 DIAGNOSIS — F332 Major depressive disorder, recurrent severe without psychotic features: Secondary | ICD-10-CM | POA: Diagnosis not present

## 2023-01-24 ENCOUNTER — Emergency Department (HOSPITAL_BASED_OUTPATIENT_CLINIC_OR_DEPARTMENT_OTHER)
Admission: EM | Admit: 2023-01-24 | Discharge: 2023-01-24 | Disposition: A | Discharge status: Home / Self Care | Payer: Medicaid Other | Attending: Emergency Medicine | Admitting: Emergency Medicine

## 2023-01-24 ENCOUNTER — Emergency Department (HOSPITAL_BASED_OUTPATIENT_CLINIC_OR_DEPARTMENT_OTHER): Payer: Medicaid Other

## 2023-01-24 ENCOUNTER — Emergency Department (HOSPITAL_BASED_OUTPATIENT_CLINIC_OR_DEPARTMENT_OTHER): Payer: Medicaid Other | Admitting: Radiology

## 2023-01-24 ENCOUNTER — Other Ambulatory Visit (HOSPITAL_BASED_OUTPATIENT_CLINIC_OR_DEPARTMENT_OTHER): Payer: Self-pay

## 2023-01-24 ENCOUNTER — Other Ambulatory Visit: Payer: Self-pay

## 2023-01-24 ENCOUNTER — Encounter (HOSPITAL_BASED_OUTPATIENT_CLINIC_OR_DEPARTMENT_OTHER): Payer: Self-pay | Admitting: Emergency Medicine

## 2023-01-24 DIAGNOSIS — M7989 Other specified soft tissue disorders: Secondary | ICD-10-CM | POA: Insufficient documentation

## 2023-01-24 DIAGNOSIS — S99911A Unspecified injury of right ankle, initial encounter: Secondary | ICD-10-CM | POA: Diagnosis present

## 2023-01-24 DIAGNOSIS — I82461 Acute embolism and thrombosis of right calf muscular vein: Secondary | ICD-10-CM | POA: Insufficient documentation

## 2023-01-24 DIAGNOSIS — X58XXXA Exposure to other specified factors, initial encounter: Secondary | ICD-10-CM | POA: Diagnosis not present

## 2023-01-24 DIAGNOSIS — S8264XA Nondisplaced fracture of lateral malleolus of right fibula, initial encounter for closed fracture: Secondary | ICD-10-CM | POA: Insufficient documentation

## 2023-01-24 MED ORDER — RIVAROXABAN 15 MG PO TABS
15.0000 mg | ORAL_TABLET | Freq: Once | ORAL | Status: AC
  Administered 2023-01-24: 15 mg via ORAL
  Filled 2023-01-24: qty 1

## 2023-01-24 MED ORDER — TRAMADOL HCL 50 MG PO TABS
50.0000 mg | ORAL_TABLET | Freq: Four times a day (QID) | ORAL | 0 refills | Status: DC | PRN
  Filled 2023-01-24: qty 15, 4d supply, fill #0

## 2023-01-24 MED ORDER — HYDROCODONE-ACETAMINOPHEN 5-325 MG PO TABS
1.0000 | ORAL_TABLET | Freq: Once | ORAL | Status: AC
  Administered 2023-01-24: 1 via ORAL
  Filled 2023-01-24: qty 1

## 2023-01-24 MED ORDER — RIVAROXABAN (XARELTO) EDUCATION KIT FOR DVT/PE PATIENTS: PACK | Freq: Once | Status: AC

## 2023-01-24 MED ORDER — ONDANSETRON 4 MG PO TBDP
4.0000 mg | ORAL_TABLET | Freq: Once | ORAL | Status: AC
  Administered 2023-01-24: 4 mg via ORAL
  Filled 2023-01-24: qty 1

## 2023-01-24 MED ORDER — RIVAROXABAN (XARELTO) VTE STARTER PACK (15 & 20 MG)
ORAL_TABLET | ORAL | 0 refills | Status: DC
  Filled 2023-01-24: qty 51, 30d supply, fill #0

## 2023-01-24 NOTE — ED Notes (Signed)
Report given to the next RN... 

## 2023-01-24 NOTE — ED Provider Notes (Signed)
[place] EMERGENCY DEPARTMENT AT [facility] Provider Note   CSN: [id number] Arrival date & time: 01/24/23  1034     History {Add pertinent medical, surgical, social history, OB history to HPI:1} Chief Complaint  Patient presents with   Ankle Pain    Theresa [name] is a 32 y.o. female who present for evaluation of right ankle pain.  Patient broke her ankle while she was on vacation in [place] 2 days ago.  She was driven home in a posterior stirrup splint which she has removed.  She complains of worsening pain throbbing and tingling on the right lower part of her leg.  She denies chest pain or shortness of breath.  She is taken naproxen which was helpful but did not significantly improve her pain.  He was told that they did not have a walking boot at the ER.  She has a previous history of right great toe surgery on that side.  She denies any numbness or tingling in the toes.   Ankle Pain      Home Medications Prior to Admission medications   Medication Sig Start Date End Date Taking? Authorizing Provider  amoxicillin-clavulanate (AUGMENTIN) 875-125 MG tablet Take 1 tablet by mouth every 12 (twelve) hours. Patient not taking: Reported on 05/07/2021 09/02/20   Rhys Martini, PA-C  cetirizine (ZYRTEC ALLERGY) 10 MG tablet Take 1 tablet (10 mg total) by mouth daily. Patient not taking: Reported on 05/07/2021 09/02/20   Rhys Martini, PA-C  fluconazole (DIFLUCAN) 150 MG tablet Take 1 tablet (150 mg total) by mouth daily. Take one pill today. If you develop symptoms, take the second pill in 3 days. Patient not taking: Reported on 05/07/2021 09/02/20   Rhys Martini, PA-C  ibuprofen (ADVIL) 800 MG tablet Take 1 tablet (800 mg total) by mouth 3 (three) times daily. Patient not taking: Reported on 05/07/2021 01/27/21   Ivette Loyal, NP  Multiple Vitamin (MULTIVITAMIN) capsule Take 1 capsule by mouth daily.    [provider]  clonazePAM (KLONOPIN) 1 MG tablet Take 1  mg by mouth daily.  03/20/17 11/12/19  [provider]  Desvenlafaxine Succinate ER 25 MG TB24 Take 50 mg by mouth daily as needed (anxiety).  05/29/17 11/12/19  [provider]  fluticasone (FLONASE) 50 MCG/ACT nasal spray Place 1 spray into both nostrils daily. 06/06/18 11/12/19  Elpidio Anis, PA-C  omeprazole (PRILOSEC) 40 MG capsule Take one pill 30 minutes before breakfast every morning. 05/16/17 11/12/19  [name], MD      Allergies    Metronidazole    Review of Systems   Review of Systems  Physical Exam Updated Vital Signs BP 116/77   Pulse 78   Temp 98 F (36.7 C)   Resp 18   Ht 5\' 4"  (1.626 m)   Wt 83.9 kg   LMP 01/17/2023   SpO2 100%   BMI 31.76 kg/m  Physical Exam Vitals and nursing note reviewed.  Constitutional:      General: She is not in acute distress.    Appearance: She is well-developed. She is not diaphoretic.  HENT:     Head: Normocephalic and atraumatic.     Right Ear: External ear normal.     Left Ear: External ear normal.     Nose: Nose normal.     Mouth/Throat:     Mouth: Mucous membranes are moist.  Eyes:     General: No scleral icterus.    Conjunctiva/sclera: Conjunctivae normal.  Cardiovascular:     Rate and Rhythm: Normal rate and regular rhythm.     Heart sounds: Normal heart sounds. No murmur heard.    No friction rub. No gallop.  Pulmonary:     Effort: Pulmonary effort is normal. No respiratory distress.     Breath sounds: Normal breath sounds.  Abdominal:     General: Bowel sounds are normal. There is no distension.     Palpations: Abdomen is soft. There is no mass.     Tenderness: There is no abdominal tenderness. There is no guarding.  Musculoskeletal:     Cervical back: Normal range of motion.     Right lower leg: Edema present.     Comments: Significant bruising along the lateral border of the right foot with swelling over the right mid lateral malleolus.  Normal DP and PT pulse able to wiggle toes.   Swelling of the right lower extremity as compared to the right to the mid calf region.  Calf compartments are soft without evidence of compartment syndrome.  Skin:    General: Skin is warm and dry.  Neurological:     Mental Status: She is alert and oriented to person, place, and time.  Psychiatric:        Behavior: Behavior normal.     ED Results / Procedures / Treatments   Labs (all labs ordered are listed, but only abnormal results are displayed) Labs Reviewed - No data to display  EKG None  Radiology No results found.  Procedures Procedures  {Document cardiac monitor, telemetry assessment procedure when appropriate:1}  Medications Ordered in ED Medications  HYDROcodone-acetaminophen (NORCO/VICODIN) 5-325 MG per tablet 1 tablet (has no administration in time range)  ondansetron (ZOFRAN-ODT) disintegrating tablet 4 mg (has no administration in time range)    ED Course/ Medical Decision Making/ A&P   {   Click here for ABCD2, HEART and other calculatorsREFRESH Note before signing :1}                          Medical Decision Making Amount and/or Complexity of Data Reviewed Radiology: ordered.  Risk Prescription drug management.   Patient here with right lower extremity injury.  Unable to obtain or see her recent x-rays.  Will repeat these x-rays and obtain a right lower extremity DVT study to rule out clot as she was in a 10-hour car ride with her recent injury.  {Document critical care time when appropriate:1} {Document review of labs and clinical decision tools ie heart score, Chads2Vasc2 etc:1}  {Document your independent review of radiology images, and any outside records:1} {Document your discussion with family members, caretakers, and with consultants:1} {Document social determinants of health affecting pt's care:1} {Document your decision making why or why not admission, treatments were needed:1} Final Clinical Impression(s) / ED Diagnoses Final diagnoses:   None    Rx / DC Orders ED Discharge Orders     None

## 2023-01-24 NOTE — ED Notes (Signed)
Patient given discharge instructions. Questions were answered. Patient verbalized understanding of discharge instructions and care at home. Discharged with family member.   CAM boot applied by ED tech prior to discharge (right foot).

## 2023-01-24 NOTE — Discharge Instructions (Signed)
Get help right away if: You have signs or symptoms that a blood clot has moved to the lungs. These may include: Shortness of breath. Chest pain. Fast or irregular heartbeats (palpitations). Light-headedness, dizziness, or fainting. Coughing up blood. You have signs or symptoms that your blood is too thin. These may include: Blood in your vomit, stool, or urine. A cut that will not stop bleeding. A menstrual period that is heavier than usual. A severe headache or confusion. These symptoms may be an emergency. Get help right away. Call 911. Do not wait to see if the symptoms will go away. Do not drive yourself to the hospital. 

## 2023-01-24 NOTE — ED Notes (Signed)
Per Abby, PA pt refuses repeat XR

## 2023-01-24 NOTE — ED Triage Notes (Signed)
Patient states she was in [place] on Saturday morning in their ED and diagnosed with a broken right ankle. Patient was placed in a temporary cast and told to follow up with ortho. Patient states she did not receive the x-ray and attempted to call there but they can not help her get disc. Patient states she is having continued pain and tylenol and ibuprofen are not working.

## 2023-02-01 ENCOUNTER — Ambulatory Visit (HOSPITAL_COMMUNITY)
Admission: RE | Admit: 2023-02-01 | Discharge: 2023-02-01 | Disposition: A | Discharge status: Home / Self Care | Payer: Medicaid Other | Source: Ambulatory Visit | Attending: Vascular Surgery | Admitting: Vascular Surgery

## 2023-02-01 ENCOUNTER — Encounter (HOSPITAL_COMMUNITY): Payer: Self-pay

## 2023-02-01 VITALS — BP 105/64 | HR 65 | Ht 64.0 in | Wt 191.7 lb

## 2023-02-01 DIAGNOSIS — I82441 Acute embolism and thrombosis of right tibial vein: Secondary | ICD-10-CM | POA: Insufficient documentation

## 2023-02-01 HISTORY — DX: Acute embolism and thrombosis of right tibial vein: I82.441

## 2023-02-01 NOTE — Progress Notes (Signed)
DVT Clinic Note  Name: Theresa [name]     MRN: [id number]     DOB: 1990-08-01     Sex: female  PCP: Patient, No Pcp Per  Today's Visit: Visit Information: Initial Visit  Referred to DVT Clinic by: Arthor Captain, PA-C [facility] ED)  Referred to CPP by: Dr. Lenell Antu Reason for referral:  Chief Complaint  Patient presents with   Med Management - DVT   HISTORY OF PRESENT ILLNESS: Theresa [name] is a 32 y.o. female who presents for follow up medication management after diagnosis of DVT on 01/24/23. She broke her ankle while on vacation in [place], then she drove home 10 hours in a splint. She then experienced worsening pain and tingling in her RLE and went to the ED 01/24/23. US showed acute DVT involving the right posterior tibial veins. She was very anxious about the DVT and was referred to the DVT Clinic for follow up.   Today patient reports she has felt very anxious about the clot. She is still having some muscle aching in her medial right calf. Denies abnormal bleeding or bruising. Denies missed doses of Xarelto but has had trouble remembering the morning dose. She is taking it with a cracker. She has two young kids and is very active. She works at home but has been getting up every 20-30 minutes to walk around the house. Is not wearing compression stockings since her foot is in a boot. She has been elevating her leg. She is currently smoking 2-3 cigarettes per day and is trying to cut down to quit. She has an appointment to establish with primary care this month.   Positive Thrombotic Risk Factors: Recent trauma (within 3 months), Sedentary journey lasting >8 hours within 4 weeks, Smoking, Obesity Bleeding Risk Factors: Anticoagulant therapy  Negative Thrombotic Risk Factors: Previous VTE, Recent surgery (within 3 months), Older age, Estrogen therapy, Testosterone therapy, Active cancer, Recent cesarean section (within 3 months), Central venous catheterization, Pregnancy, Recent admission to  hospital with acute illness (within 3 months), Paralysis, paresis, or recent plaster cast immobilization of lower extremity, Bed rest >72 hours within 3 months, Within 6 weeks postpartum, Erythropoiesis-stimulating agent, Non-malignant, chronic inflammatory condition, Known thrombophilic condition, Recent COVID diagnosis (within 3 months)  Rx Insurance Coverage: Medicaid Rx Affordability: Xarelto should be $4 per 30 or 90 day supply Preferred Pharmacy: Prefers mail order/delivery for Xarelto refills to [facility].  Past Medical History:  Diagnosis Date   Achalasia    Anxiety    Asthma    Chlamydia age 18   Depression    GERD (gastroesophageal reflux disease)    Left fibular fracture 12/2011   referred by ED to outpt ortho. non-surgical mgt.    Obesity (BMI 30-39.9) 07/2013    Past Surgical History:  Procedure Laterality Date   BOTOX INJECTION N/A 01/26/2014   Procedure: BOTOX INJECTION;  Surgeon: Hart Carwin, MD;  Location: [facility];  Service: Endoscopy;  Laterality: N/A;   CESAREAN SECTION  12/2008   CESAREAN SECTION N/A 03/23/2015   Procedure: CESAREAN SECTION;  Surgeon: Silverio Lay, MD;  Location: [facility];  Service: Obstetrics;  Laterality: N/A;   ESOPHAGEAL MANOMETRY N/A 08/18/2013   Procedure: ESOPHAGEAL MANOMETRY (EM);  Surgeon: Hart Carwin, MD;  Location: [facility];  Service: Endoscopy;  Laterality: N/A;   ESOPHAGOGASTRODUODENOSCOPY N/A 01/26/2014   Procedure: ESOPHAGOGASTRODUODENOSCOPY (EGD);  Surgeon: Hart Carwin, MD;  Location: [facility];  Service: Endoscopy;  Laterality: N/A;   ESOPHAGOGASTRODUODENOSCOPY N/A  03/25/2015   Procedure:  EGD with Botox;  Surgeon: [name], MD;  Location: [facility];  Service: Endoscopy;  Laterality: N/A;   ESOPHAGOGASTRODUODENOSCOPY N/A 11/01/2015   Procedure: ESOPHAGOGASTRODUODENOSCOPY (EGD);  Surgeon: Avel Peace, MD;  [facility];  Service: General;  Laterality: N/A;   ESOPHAGOGASTRODUODENOSCOPY  N/A 05/08/2017   Procedure: ESOPHAGOGASTRODUODENOSCOPY (EGD);  Surgeon: Iva Boop, MD;  Location: [facility] ENDOSCOPY;  Service: Endoscopy;  Laterality: N/A;   ESOPHAGOGASTRODUODENOSCOPY (EGD) WITH ESOPHAGEAL DILATION N/A 08/06/2013   Procedure: ESOPHAGOGASTRODUODENOSCOPY (EGD) WITH ESOPHAGEAL DILATION;  Surgeon: Hart Carwin, MD;  Location: [facility] ENDOSCOPY;  Service: Endoscopy;  Laterality: N/A;   FOREIGN BODY REMOVAL N/A 05/08/2017   Procedure: FOREIGN BODY REMOVAL;  Surgeon: Iva Boop, MD;  Location: [facility] ENDOSCOPY;  Service: Endoscopy;  Laterality: N/A;   HELLER MYOTOMY N/A 11/01/2015   Procedure: LAPAROSCOPIC HELLER MYOTOMY AND DOR FUNDOPLICATION;  Surgeon: Avel Peace, MD;  Location: [facility];  Service: General;  Laterality: N/A;   TONSILLECTOMY  07/2007   Dr Narda Bonds    Social History   Socioeconomic History   Marital status: Single    Spouse name: Not on file   Number of children: 2   Years of education: Not on file   Highest education level: Associate degree: occupational, Scientist, product/process development, or vocational program  Occupational History   Not on file  Tobacco Use   Smoking status: Every Day    Current packs/day: 0.00    Types: Cigarettes    Last attempt to quit: 10/24/2014    Years since quitting: 8.2   Smokeless tobacco: Never  Vaping Use   Vaping status: Never Used  Substance and Sexual Activity   Alcohol use: Yes    Comment: occassionally   Drug use: No   Sexual activity: Yes    Birth control/protection: None  Other Topics Concern   Not on file  Social History Narrative   Not on file   Social Determinants of Health   Financial Resource Strain: Not on file  Food Insecurity: Not on file  Transportation Needs: No Transportation Needs (03/20/2019)   PRAPARE - Administrator, Civil Service (Medical): No    Lack of Transportation (Non-Medical): No  Physical Activity: Not on file  Stress: Not on file  Social Connections: Not on file  Intimate Partner  Violence: Not on file    Family History  Problem Relation Age of Onset   Hypertension Other    Colon cancer Neg Hx    Esophageal cancer Neg Hx    Rectal cancer Neg Hx    Stomach cancer Neg Hx    Colon polyps Neg Hx     Allergies as of 02/01/2023 - Review Complete 02/01/2023  Allergen Reaction Noted   Metronidazole Itching 05/07/2021    Current Outpatient Medications on File Prior to Encounter  Medication Sig Dispense Refill   calcium carbonate (TUMS) 500 MG chewable tablet Chew 1 tablet by mouth daily as needed for indigestion or heartburn.     hydrOXYzine (VISTARIL) 25 MG capsule Take 25 mg by mouth every 6 (six) hours as needed.     Multiple Vitamin (MULTIVITAMIN) capsule Take 1 capsule by mouth daily.     RIVAROXABAN (XARELTO) VTE STARTER PACK (15 & 20 MG) Follow package directions: Take one 15mg  tablet by mouth twice a day. On day 22, switch to one 20mg  tablet once a day. Take with food. 51 each 0   traMADol (ULTRAM) 50 MG tablet Take 1 tablet (50  mg total) by mouth every 6 (six) hours as needed. 15 tablet 0   vortioxetine HBr (TRINTELLIX) 10 MG TABS tablet Take 10 mg by mouth daily.     [DISCONTINUED] clonazePAM (KLONOPIN) 1 MG tablet Take 1 mg by mouth daily.   2   [DISCONTINUED] Desvenlafaxine Succinate ER 25 MG TB24 Take 50 mg by mouth daily as needed (anxiety).   0   [DISCONTINUED] fluticasone (FLONASE) 50 MCG/ACT nasal spray Place 1 spray into both nostrils daily. 16 g 0   [DISCONTINUED] omeprazole (PRILOSEC) 40 MG capsule Take one pill 30 minutes before breakfast every morning. 30 capsule 5   No current facility-administered medications on file prior to encounter.   REVIEW OF SYSTEMS:  Review of Systems  Respiratory:  Negative for shortness of breath.   Cardiovascular:  Positive for leg swelling (R ankle). Negative for chest pain and palpitations.  Musculoskeletal:  Positive for myalgias.  Neurological:  Negative for dizziness and tingling.   PHYSICAL EXAMINATION:   Vitals:   02/01/23 1028  BP: 105/64  Pulse: 65  SpO2: 100%  Weight: 191 lb 11.2 oz (87 kg)  Height: 5\' 4"  (1.626 m)    Body mass index is 32.91 kg/m.  Physical Exam Vitals reviewed.  Cardiovascular:     Rate and Rhythm: Normal rate.  Pulmonary:     Effort: Pulmonary effort is normal.  Musculoskeletal:     Right lower leg: Edema (R ankle at site of fracture) present.     Left lower leg: No edema.  Skin:    Findings: No bruising or erythema.  Psychiatric:        Mood and Affect: Mood normal.        Behavior: Behavior normal.        Thought Content: Thought content normal.   Villalta Score for Post-Thrombotic Syndrome: Pain: Moderate Cramps: Absent Heaviness: Mild Paresthesia: Mild Pruritus: Mild Pretibial Edema: Mild Skin Induration: Absent Hyperpigmentation: Absent Redness: Absent Venous Ectasia: Absent Pain on calf compression: Mild Villalta Preliminary Score: 7 Is venous ulcer present?: No If venous ulcer is present and score is <15, then 15 points total are assigned: Absent Villalta Total Score: 7  LABS:  CBC     Component Value Date/Time   WBC 2.5 (L) 04/22/2020 1150   RBC 4.20 04/22/2020 1150   HGB 13.0 04/22/2020 1150   HCT 38.5 04/22/2020 1150   PLT 211 04/22/2020 1150   MCV 91.7 04/22/2020 1150   MCH 31.0 04/22/2020 1150   MCHC 33.8 04/22/2020 1150   RDW 13.1 04/22/2020 1150   LYMPHSABS 0.3 (L) 04/22/2020 1150   MONOABS 0.4 04/22/2020 1150   EOSABS 0.0 04/22/2020 1150   BASOSABS 0.0 04/22/2020 1150    Hepatic Function      Component Value Date/Time   PROT 6.8 04/22/2020 1150   ALBUMIN 3.8 04/22/2020 1150   AST 23 04/22/2020 1150   ALT 17 04/22/2020 1150   ALKPHOS 64 04/22/2020 1150   BILITOT 0.4 04/22/2020 1150    Renal Function   Lab Results  Component Value Date   CREATININE 0.68 04/22/2020   CREATININE 0.62 05/08/2017   CREATININE 0.65 05/07/2017    CrCl cannot be calculated (Patient's most recent lab result is older than the  maximum 21 days allowed.).   VVS Vascular Lab Studies:  02/01/23 doppler Examination is POSITIVE for acute, occlusive DVT within the RIGHT calf (posterior tibial) veins.  ASSESSMENT: Location of DVT: Right distal vein Cause of DVT: provoked by a transient risk factor -  recent R ankle fracture followed by prolonged (10 hour) car travel immediately preceding symptom onset. No need for vascular surgery intervention. She was started on Xarelto in the ED which is appropriate. We discussed the need to take this with food (ideally more than a cracker). Will plan for 3 months of anticoagulation for a first provoked DVT. Extensively counseled the patient on her DVT and on Xarelto. She reports she feels much less anxious now. We will arrange refills for her to be delivered to her house.   PLAN: -Continue rivaroxaban (Xarelto) 15 mg twice daily with food for 21 days followed by 20 mg daily with food. -Expected duration of therapy: 3 months. Therapy started on 01/24/23. -Patient educated on purpose, proper use and potential adverse effects of rivaroxaban (Xarelto). -Discussed importance of taking medication around the same time every day. -Advised patient of medications to avoid (NSAIDs, aspirin doses >100 mg daily). -Educated that Tylenol (acetaminophen) is the preferred analgesic to lower the risk of bleeding. -Advised patient to alert all providers of anticoagulation therapy prior to starting a new medication or having a procedure. -Emphasized importance of monitoring for signs and symptoms of bleeding (abnormal bruising, prolonged bleeding, nose bleeds, bleeding from gums, discolored urine, black tarry stools). -Educated patient to present to the ED if emergent signs and symptoms of new thrombosis occur. -Counseled patient to wear compression stockings daily, removing at night. Elevate leg to help with swelling.   Follow up: with DVT Clinic in 3 months. Per the patient's preference, I will arrange her  refills to deliver to her through [facility] - will send this in closer to the time she's due to refill.   Pervis Hocking, PharmD, [place], CPP Deep [facility]in Thrombosis Clinic Clinical Pharmacist Practitioner Office: [phone]

## 2023-02-01 NOTE — Patient Instructions (Signed)
-  Continue rivaroxaban (Xarelto) 15 mg twice daily with food for 21 days followed by 20 mg daily with food. -Your refills will be sent to our [facility] pharmacy to send them to your house.  -It is important to take your medication around the same time every day.  -Avoid NSAIDs like ibuprofen (Advil, Motrin) and naproxen (Aleve) as well as aspirin doses over 100 mg daily. -Tylenol (acetaminophen) is the preferred over the counter pain medication to lower the risk of bleeding. -Be sure to alert all of your health care providers that you are taking an anticoagulant prior to starting a new medication or having a procedure. -Monitor for signs and symptoms of bleeding (abnormal bruising, prolonged bleeding, nose bleeds, bleeding from gums, discolored urine, black tarry stools). If you have fallen and hit your head OR if your bleeding is severe or not stopping, seek emergency care.  -Go to the emergency room if emergent signs and symptoms of new clot occur (new or worse swelling and pain in an arm or leg, shortness of breath, chest pain, fast or irregular heartbeats, lightheadedness, dizziness, fainting, coughing up blood) or if you experience a significant color change (pale or blue) in the extremity that has the DVT.  -We recommend you wear compression stockings (20-30 mmHg) as long as you are having swelling or pain. Be sure to purchase the correct size and take them off at night. In the meantime while in your boot, you can elevate your legs.   Your next visit is on Tuesday October 8th at [facility] [address] [place], [place], [place] [address] Enter the hospital through Entrance C off [facility] and pull up to the Heart & Vascular Center entrance to the free valet parking.  Check in for your appointment at the Heart & Vascular Center.   If you have any questions or need to reschedule an appointment, please call [phone] [facility].  If you are having an emergency, call  [phone] or present to the nearest emergency room.   What is a DVT?  -Deep vein thrombosis (DVT) is a condition in which a blood clot forms in a vein of the deep venous system which can occur in the lower leg, thigh, pelvis, arm, or neck. This condition is serious and can be life-threatening if the clot travels to the arteries of the lungs and causing a blockage (pulmonary embolism, PE). A DVT can also damage veins in the leg, which can lead to long-term venous disease, leg pain, swelling, discoloration, and ulcers or sores (post-thrombotic syndrome).  -Treatment may include taking an anticoagulant medication to prevent more clots from forming and the current clot from growing, wearing compression stockings, and/or surgical procedures to remove or dissolve the clot.

## 2023-02-12 ENCOUNTER — Encounter: Payer: Self-pay | Admitting: Internal Medicine

## 2023-02-12 ENCOUNTER — Ambulatory Visit: Payer: Medicaid Other | Admitting: Internal Medicine

## 2023-02-12 VITALS — BP 108/68 | HR 85 | Temp 98.6°F | Ht 64.0 in | Wt 195.4 lb

## 2023-02-12 DIAGNOSIS — I82441 Acute embolism and thrombosis of right tibial vein: Secondary | ICD-10-CM

## 2023-02-12 DIAGNOSIS — N6313 Unspecified lump in the right breast, lower outer quadrant: Secondary | ICD-10-CM | POA: Diagnosis not present

## 2023-02-12 DIAGNOSIS — S8264XA Nondisplaced fracture of lateral malleolus of right fibula, initial encounter for closed fracture: Secondary | ICD-10-CM | POA: Diagnosis not present

## 2023-02-12 DIAGNOSIS — K220 Achalasia of cardia: Secondary | ICD-10-CM

## 2023-02-12 NOTE — Progress Notes (Signed)
[facility] PRIMARY [facility]ARE [facility] [address] [place] [address] Dept: [phone] Dept Fax: [phone]  New Patient Office Visit  Subjective:   Theresa [name] [birth year] 02/12/2023  Chief Complaint  Patient presents with   Establish Care    Weight management, 2 years ago lump in left breast now right breast pain after taking piecing out a year ago    HPI: Theresa [name] presents today to establish care at [facility] [employer]. Introduced to Publishing rights manager role and practice setting.  All questions answered.  Concerns: See below   Discussed the use of AI scribe software for clinical note transcription with the patient, who gave verbal consent to proceed.  History of Present Illness    Theresa [name] is a 32 year old female who presents for recent diagnosis on 01/24/2023 of a closed nondisplaced fracture of the lateral malleolus and right fibula, with complication of acute DVT of the tibial vein of the right lower extremity.  She was placed into a walking boot at her ER visit on 01/24/2023 and started on Xarelto.  Patient has followed up at the DVT clinic on 02/01/2023, and she has a future follow-up visit in October.  She is compliant with Xarelto, currently taking 15 mg twice daily which she has 2 days left, then she will transition to 20 mg once a day.  She reports she was supposed to follow-up with Ortho regarding her fracture, but has not been contacted for appointment set up.   The patient also reports a lump in the right breast, which she has noticed over the past three to four months. She describes the lump as causing a tight, aching pain, but not sharp pain. She has a history of a lump in the left breast, which was biopsied and found to be a swollen lymph node. The patient also mentions having dense breast tissue.  The patient is also concerned about her diagnosis and history of achalasia.  She has been followed by GI  in the past and underwent EGD with esophageal dilation.  She states that the surgery did improve her swallowing and digestion, but she has recently noticed impairment in digestion and dysphagia.  She is not currently under any GI care.    The following portions of the patient's history were reviewed and updated as appropriate: past medical history, past surgical history, family history, social history, allergies, medications, and problem list.   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   Status post repeat low transverse cesarean section 03/23/2015   Cesarean delivery delivered 03/23/2015   H/O: depression 11/24/2014   H/O: C-section - 2010 - NRFHRT 11/23/2014   Breast lump 11/23/2014   Infection due to enterococcus - at [facility] - treated 11/23/2014   Vomiting 08/06/2013   Tobacco abuse 08/06/2013   Achalasia, esophageal s/p laparoscopic Heller myotomy and Dor fundoplication 11/01/15 08/06/2013   Obesity (BMI 30-39.9) 07/2013   Past Medical History:  Diagnosis Date   Achalasia    Anxiety    Asthma    Chlamydia age 59   Depression  GERD (gastroesophageal reflux disease)    Left fibular fracture 12/2011   referred by ED to outpt ortho. non-surgical mgt.    Obesity (BMI 30-39.9) 07/2013   Past Surgical History:  Procedure Laterality Date   BOTOX INJECTION N/A 01/26/2014   Procedure: BOTOX INJECTION;  Surgeon: Hart Carwin, MD;  [facility];  Service: Endoscopy;  Laterality: N/A;   CESAREAN SECTION  12/2008   CESAREAN SECTION N/A 03/23/2015   Procedure: CESAREAN SECTION;  Surgeon: Silverio Lay, MD;  Location: [facility];  Service: Obstetrics;  Laterality: N/A;   ESOPHAGEAL MANOMETRY N/A 08/18/2013   Procedure: ESOPHAGEAL MANOMETRY (EM);  Surgeon: Hart Carwin, MD;  [facility];  Service: Endoscopy;  Laterality: N/A;   ESOPHAGOGASTRODUODENOSCOPY N/A 01/26/2014   Procedure: ESOPHAGOGASTRODUODENOSCOPY (EGD);  Surgeon: Hart Carwin, MD;  Location: [facility] ENDOSCOPY;  Service: Endoscopy;  Laterality: N/A;   ESOPHAGOGASTRODUODENOSCOPY N/A 03/25/2015   Procedure:  EGD with Botox;  Surgeon: [name], MD;  Location: [facility] ENDOSCOPY;  Service: Endoscopy;  Laterality: N/A;   ESOPHAGOGASTRODUODENOSCOPY N/A 11/01/2015   Procedure: ESOPHAGOGASTRODUODENOSCOPY (EGD);  Surgeon: Avel Peace, MD;  Location: [facility];  Service: General;  Laterality: N/A;   ESOPHAGOGASTRODUODENOSCOPY N/A 05/08/2017   Procedure: ESOPHAGOGASTRODUODENOSCOPY (EGD);  Surgeon: Iva Boop, MD;  Location: [facility] ENDOSCOPY;  Service: Endoscopy;  Laterality: N/A;   ESOPHAGOGASTRODUODENOSCOPY (EGD) WITH ESOPHAGEAL DILATION N/A 08/06/2013   Procedure: ESOPHAGOGASTRODUODENOSCOPY (EGD) WITH ESOPHAGEAL DILATION;  Surgeon: Hart Carwin, MD;  Location: [facility] ENDOSCOPY;  Service: Endoscopy;  Laterality: N/A;   FOREIGN BODY REMOVAL N/A 05/08/2017   Procedure: FOREIGN BODY REMOVAL;  Surgeon: Iva Boop, MD;  Location: [facility] ENDOSCOPY;  Service: Endoscopy;  Laterality: N/A;   HELLER MYOTOMY N/A 11/01/2015   Procedure: LAPAROSCOPIC HELLER MYOTOMY AND DOR FUNDOPLICATION;  Surgeon: Avel Peace, MD;  Location: [facility]S;  Service: General;  Laterality: N/A;   TONSILLECTOMY  07/2007   Dr Narda Bonds   Family History  Problem Relation Age of Onset   Hypertension Other    Colon cancer Neg Hx    Esophageal cancer Neg Hx    Rectal cancer Neg Hx    Stomach cancer Neg Hx    Colon polyps Neg Hx    Outpatient Medications Prior to Visit  Medication Sig Dispense Refill   calcium carbonate (TUMS) 500 MG chewable tablet Chew 1 tablet by  mouth daily as needed for indigestion or heartburn.     hydrOXYzine (VISTARIL) 25 MG capsule Take 25 mg by mouth every 6 (six) hours as needed.     Multiple Vitamin (MULTIVITAMIN) capsule Take 1 capsule by mouth daily.     RIVAROXABAN (XARELTO) VTE STARTER PACK (15 & 20 MG) Follow package directions: Take one 15mg  tablet by mouth twice a day. On day 22, switch to one 20mg  tablet once a day. Take with food. 51 each 0   traMADol (ULTRAM) 50 MG tablet Take 1 tablet (50 mg total) by mouth every 6 (six) hours as needed. 15 tablet 0   vortioxetine HBr (TRINTELLIX) 10 MG TABS tablet Take 10 mg by mouth daily.     No facility-administered medications prior to visit.   Allergies  Allergen Reactions   Metronidazole Itching    ROS: A complete ROS was performed with pertinent positives/negatives noted in the HPI. The remainder of the ROS are negative.   Objective:   Today's Vitals   02/12/23 0830  BP: 108/68  Pulse: 85  Temp: 98.6 F (37 C)  TempSrc: Temporal  SpO2: 98%  Weight: 195 lb 6.4 oz (88.6 kg)  Height: 5\' 4"  (1.626 m)    GENERAL: Well-appearing, in NAD. Well nourished.  SKIN: Pink, warm and dry. NECK: Trachea midline. Full ROM w/o pain or tenderness. No lymphadenopathy.  RESPIRATORY: Chest wall symmetrical. Respirations even and non-labored. Breath sounds clear to auscultation bilaterally.  CARDIAC: S1, S2 present, regular rate and rhythm. Peripheral pulses 2+ bilaterally.  BREASTS: Breasts pendulous, symmetrical, and w/o palpable masses. Nipples everted and w/o discharge. No rash or skin retraction. No axillary or supraclavicular lymphadenopathy. Firm, dense tissue palpable to outer quadrant of right breast, no specific identifiable mass.  EXTREMITIES: Without clubbing, cyanosis, or edema.  NEUROLOGIC: No motor or sensory deficits. Steady, even gait.  PSYCH/MENTAL STATUS: Alert, oriented x 3. Cooperative, appropriate mood and affect.   Health Maintenance Due  Topic Date Due    PAP SMEAR-Modifier  Never done    No results found for any visits on 02/12/23.  Assessment & Plan:   1. Acute deep vein thrombosis (DVT) of tibial vein of right lower extremity (HCC) -Continue Xarelto therapy as prescribed -Continue follow-up with DVT clinic as scheduled in October  2. Closed nondisplaced fracture of lateral malleolus of right fibula, initial encounter - Ambulatory referral to Orthopedic Surgery  3. Mass of lower outer quadrant of right breast - MM 3D DIAGNOSTIC MAMMOGRAM UNILATERAL RIGHT BREAST; Future  4. Achalasia - Ambulatory referral to Gastroenterology   Return in about 3 months (around 05/15/2023) for Chronic Condition follow up.   Of note, portions of this note may have been created with voice recognition software Physicist, medical). While this note has been edited for accuracy, occasional wrong-word or 'sound-a-like' substitutions may have occurred due to the inherent limitations of voice recognition software.  Salvatore Decent, FNP

## 2023-02-16 ENCOUNTER — Other Ambulatory Visit (HOSPITAL_COMMUNITY): Payer: Self-pay

## 2023-02-16 ENCOUNTER — Other Ambulatory Visit: Payer: Self-pay

## 2023-02-16 ENCOUNTER — Telehealth (HOSPITAL_COMMUNITY): Payer: Self-pay | Admitting: Student-PharmD

## 2023-02-16 MED ORDER — RIVAROXABAN 20 MG PO TABS
20.0000 mg | ORAL_TABLET | Freq: Every day | ORAL | 0 refills | Status: DC
  Filled 2023-02-16 – 2023-02-24 (×2): qty 60, 60d supply, fill #0

## 2023-02-16 NOTE — Telephone Encounter (Signed)
Patient called concerned because she started her period yesterday, and it is much heavier than normal for her. She is also passing more clots in her period than what is normal for her. She knows we discussed at her last visit that this would likely happen while she is on anticoagulation but wants to confirm that this is normal. I explained that it is very common for women who are menstruating to have heavier periods and more clots due to the heavier menstrual bleeding while on anticoagulation. She is not experiencing any lightheadedness or dizziness. No other concerning symptoms. We discussed that there is some data that suggests all anticoagulation worsens menstruation but Eliquis may worsen it the least. She would rather wait it out on Xarelto since she is already used to taking it and she will only be on it for 3 months. She'll call me if any other concerns arise.   She also noted that her last dose of Xarelto in her starter pack is on Wednesday. She wants her refills to come from [facility] for delivery. I went ahead and sent these in today.

## 2023-02-19 ENCOUNTER — Other Ambulatory Visit: Payer: Self-pay

## 2023-02-19 ENCOUNTER — Other Ambulatory Visit (HOSPITAL_COMMUNITY): Payer: Self-pay

## 2023-02-20 ENCOUNTER — Other Ambulatory Visit: Payer: Self-pay

## 2023-02-20 ENCOUNTER — Other Ambulatory Visit (INDEPENDENT_AMBULATORY_CARE_PROVIDER_SITE_OTHER): Payer: Medicaid Other

## 2023-02-20 ENCOUNTER — Encounter: Payer: Self-pay | Admitting: Pharmacist

## 2023-02-20 ENCOUNTER — Ambulatory Visit (INDEPENDENT_AMBULATORY_CARE_PROVIDER_SITE_OTHER): Payer: Medicaid Other | Admitting: Orthopedic Surgery

## 2023-02-20 ENCOUNTER — Encounter: Payer: Self-pay | Admitting: Orthopedic Surgery

## 2023-02-20 DIAGNOSIS — M25531 Pain in right wrist: Secondary | ICD-10-CM

## 2023-02-20 DIAGNOSIS — S82891A Other fracture of right lower leg, initial encounter for closed fracture: Secondary | ICD-10-CM | POA: Diagnosis not present

## 2023-02-20 DIAGNOSIS — M25571 Pain in right ankle and joints of right foot: Secondary | ICD-10-CM

## 2023-02-20 NOTE — Progress Notes (Signed)
Office Visit Note   Patient: Theresa [name]           Date of Birth: 1991/06/22           MRN: [id number] Visit Date: 02/20/2023              Requested by: Salvatore Decent, FNP [address] [place],  [place] [address] PCP: Patient, No Pcp Per  Chief Complaint  Patient presents with   Right Ankle - Pain    Was in ER 01/24/2023 following  an  injury that occurred while the pt was on vacation 2 days prior to that.    Right Wrist - Pain      HPI: Patient is a 32 year old woman who fell 4 weeks ago while on vacation.  She was placed in a fracture boot she has been full weightbearing she states around the house she ambulates without the boot.  Patient states she has had a recent DVT in the calf and she is on Xarelto.  Patient also complains of pain over the first dorsal extensor compartment which has been chronic.  Assessment & Plan: Visit Diagnoses:  1. Pain in right ankle and joints of right foot   2. Pain in right wrist   3. Closed fracture of right ankle, initial encounter     Plan: Recommended Voltaren gel and range of motion for the right wrist.  She will wear the fracture boot for 2 more weeks and then advance to an ASO.  Follow-Up Instructions: No follow-ups on file.   Ortho Exam  Patient is alert, oriented, no adenopathy, well-dressed, normal affect, normal respiratory effort. Examination the fracture is nontender to palpation and there are no skin lesions no ecchymosis or bruising.  Radiograph shows a nondisplaced Weber B fibular fracture.  Examination of the wrist the scaphoid scapholunate and TFCC are nontender to palpation she does have tenderness to palpation over the first dorsal extensor compartment.  The Finkelstein's test is moderately painful  Imaging: XR Ankle 2 Views Right  Result Date: 02/20/2023 2 view radiographs of the right ankle shows a Weber B fibular fracture with a congruent mortise the fibula is out to length.  XR Wrist 2 Views Right  Result  Date: 02/20/2023 2 view radiographs of the right wrist shows no acute fractures.  No images are attached to the encounter.  Labs: Lab Results  Component Value Date   REPTSTATUS 04/27/2013 FINAL 04/25/2013   CULT  04/25/2013    ESCHERICHIA COLI Performed at [facility]   LABORGA ESCHERICHIA COLI 04/25/2013     Lab Results  Component Value Date   ALBUMIN 3.8 04/22/2020   ALBUMIN 3.9 05/08/2017   ALBUMIN 4.3 10/26/2015    No results found for: "MG" No results found for: "VD25OH"  No results found for: "PREALBUMIN"    Latest Ref Rng & Units 04/22/2020   11:50 AM 05/08/2017    1:40 PM 05/07/2017    8:01 PM  CBC EXTENDED  WBC 4.0 - 10.5 K/uL 2.5  6.5  7.0   RBC 3.87 - 5.11 MIL/uL 4.20  4.66  4.93   Hemoglobin 12.0 - 15.0 g/dL 19.1  47.8  29.5   HCT 36.0 - 46.0 % 38.5  41.6  44.3   Platelets 150 - 400 K/uL 211  231  210   NEUT# 1.7 - 7.7 K/uL 1.7  3.7    Lymph# 0.7 - 4.0 K/uL 0.3  2.0       There is no height  or weight on file to calculate BMI.  Orders:  Orders Placed This Encounter  Procedures   XR Ankle 2 Views Right   XR Wrist 2 Views Right   No orders of the defined types were placed in this encounter.    Procedures: No procedures performed  Clinical Data: No additional findings.  ROS:  All other systems negative, except as noted in the HPI. Review of Systems  Objective: Vital Signs: LMP 01/17/2023   Specialty Comments:  No specialty comments available.  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   Status post repeat low transverse cesarean  section 03/23/2015   Cesarean delivery delivered 03/23/2015   H/O: depression 11/24/2014   H/O: C-section - 2010 - NRFHRT 11/23/2014   Breast lump 11/23/2014   Infection due to enterococcus - at [facility] - treated 11/23/2014   Vomiting 08/06/2013   Tobacco abuse 08/06/2013   Achalasia, esophageal s/p laparoscopic Heller myotomy and Dor fundoplication 11/01/15 08/06/2013   Obesity (BMI 30-39.9) 07/2013   Past Medical History:  Diagnosis Date   Achalasia    Anxiety    Asthma    Chlamydia age 68   Depression    GERD (gastroesophageal reflux disease)    Left fibular fracture 12/2011   referred by ED to outpt ortho. non-surgical mgt.    Obesity (BMI 30-39.9) 07/2013    Family History  Problem Relation Age of Onset   Hypertension Other    Colon cancer Neg Hx    Esophageal cancer Neg Hx    Rectal cancer Neg Hx    Stomach cancer Neg Hx    Colon polyps Neg Hx     Past Surgical History:  Procedure Laterality Date   BOTOX INJECTION N/A 01/26/2014   Procedure: BOTOX INJECTION;  Surgeon: Hart Carwin, MD;  Location: [facility];  Service: Endoscopy;  Laterality: N/A;   CESAREAN SECTION  12/2008   CESAREAN SECTION N/A 03/23/2015   Procedure: CESAREAN SECTION;  Surgeon: Silverio Lay, MD;  Location: [facility];  Service: Obstetrics;  Laterality: N/A;   ESOPHAGEAL MANOMETRY N/A 08/18/2013   Procedure: ESOPHAGEAL MANOMETRY (EM);  Surgeon: Hart Carwin, MD;  Location: [facility];  Service: Endoscopy;  Laterality: N/A;   ESOPHAGOGASTRODUODENOSCOPY N/A 01/26/2014   Procedure: ESOPHAGOGASTRODUODENOSCOPY (EGD);  Surgeon: Hart Carwin, MD;  Location: [facility];  Service: Endoscopy;  Laterality: N/A;   ESOPHAGOGASTRODUODENOSCOPY N/A 03/25/2015   Procedure:  EGD with Botox;  Surgeon: [name], MD;  Location: [facility];  Service: Endoscopy;  Laterality: N/A;   ESOPHAGOGASTRODUODENOSCOPY N/A 11/01/2015   Procedure: ESOPHAGOGASTRODUODENOSCOPY (EGD);  Surgeon: Avel Peace, MD;  Location: [facility];   Service: General;  Laterality: N/A;   ESOPHAGOGASTRODUODENOSCOPY N/A 05/08/2017   Procedure: ESOPHAGOGASTRODUODENOSCOPY (EGD);  Surgeon: Iva Boop, MD;  Location[facility] ENDOSCOPY;  Service: Endoscopy;  Laterality: N/A;   ESOPHAGOGASTRODUODENOSCOPY (EGD) WITH ESOPHAGEAL DILATION N/A 08/06/2013   Procedure: ESOPHAGOGASTRODUODENOSCOPY (EGD) WITH ESOPHAGEAL DILATION;  Surgeon: Hart Carwin, MD;  Location: [facility] ENDOSCOPY;  Service: Endoscopy;  Laterality: N/A;   FOREIGN BODY REMOVAL N/A 05/08/2017   Procedure: FOREIGN  BODY REMOVAL;  Surgeon: Iva Boop, MD;  Location: [facility] ENDOSCOPY;  Service: Endoscopy;  Laterality: N/A;   HELLER MYOTOMY N/A 11/01/2015   Procedure: LAPAROSCOPIC HELLER MYOTOMY AND DOR FUNDOPLICATION;  Surgeon: Avel Peace, MD;  Location: [facility];  Service: General;  Laterality: N/A;   TONSILLECTOMY  07/2007   Dr Narda Bonds   Social History   Occupational History   Not on file  Tobacco Use   Smoking status: Every Day    Current packs/day: 0.00    Types: Cigarettes    Last attempt to quit: 10/24/2014    Years since quitting: 8.3   Smokeless tobacco: Never  Vaping Use   Vaping status: Never Used  Substance and Sexual Activity   Alcohol use: Yes    Comment: occassionally   Drug use: No   Sexual activity: Yes    Birth control/protection: None

## 2023-02-22 ENCOUNTER — Other Ambulatory Visit (HOSPITAL_COMMUNITY): Payer: Self-pay

## 2023-02-24 ENCOUNTER — Other Ambulatory Visit (HOSPITAL_COMMUNITY): Payer: Self-pay

## 2023-02-25 ENCOUNTER — Other Ambulatory Visit: Payer: Self-pay

## 2023-02-26 ENCOUNTER — Other Ambulatory Visit: Payer: Self-pay

## 2023-03-09 ENCOUNTER — Other Ambulatory Visit: Payer: Self-pay | Admitting: Internal Medicine

## 2023-03-09 DIAGNOSIS — N6313 Unspecified lump in the right breast, lower outer quadrant: Secondary | ICD-10-CM

## 2023-03-15 ENCOUNTER — Telehealth: Payer: Self-pay | Admitting: General Practice

## 2023-03-15 ENCOUNTER — Other Ambulatory Visit: Payer: Self-pay

## 2023-03-15 ENCOUNTER — Other Ambulatory Visit: Payer: Medicaid Other

## 2023-03-15 DIAGNOSIS — N6313 Unspecified lump in the right breast, lower outer quadrant: Secondary | ICD-10-CM

## 2023-03-15 NOTE — Telephone Encounter (Signed)
Theresa [name] [phone]   Pt said she was late for her breast imaging appt this morning and they would not see her. The next appt they have available is 9/15. She would like to find another office that has something sooner.

## 2023-03-15 NOTE — Telephone Encounter (Signed)
Patient is scheduled with [facility] on Sept 4th at 1:30pm. Spoke to patient and she is aware of appointment details, and of their phone number if she needs to reschedule.

## 2023-03-15 NOTE — Telephone Encounter (Signed)
Location was changed and placed as STAT

## 2023-03-16 NOTE — Addendum Note (Signed)
Addended by: Mary Sella D on: 03/16/2023 09:30 AM   Modules accepted: Orders

## 2023-04-24 ENCOUNTER — Ambulatory Visit (HOSPITAL_COMMUNITY)
Admission: RE | Admit: 2023-04-24 | Discharge: 2023-04-24 | Disposition: A | Discharge status: Home / Self Care | Payer: Medicaid Other | Source: Ambulatory Visit | Attending: Vascular Surgery | Admitting: Vascular Surgery

## 2023-04-24 ENCOUNTER — Encounter (HOSPITAL_COMMUNITY): Payer: Self-pay

## 2023-04-24 VITALS — BP 104/61 | HR 91

## 2023-04-24 DIAGNOSIS — I82441 Acute embolism and thrombosis of right tibial vein: Secondary | ICD-10-CM | POA: Insufficient documentation

## 2023-04-24 NOTE — Patient Instructions (Signed)
You have been discharged from the DVT Clinic! No further follow up in the DVT Clinic is needed.  [employer] your current supply of Xarelto (20 mg daily with food) then discontinue as you will have completed 3 months of treatment.  -Follow up with your primary care provider.   Please reach out if any questions come up. [phone] [facility].

## 2023-04-24 NOTE — Progress Notes (Signed)
DVT Clinic Note  Name: Theresa [name]     MRN: [id number]     DOB: [birth year]     Sex: female  PCP: Patient, No Pcp Per  Today's Visit: Visit Information: Discharge Visit  Referred to DVT Clinic by: Emergency Department - Arthor Captain, PA-C  Referred to CPP by: Dr. Randie Heinz Reason for referral:  Chief Complaint  Patient presents with   Med Management - DVT   HISTORY OF PRESENT ILLNESS: Theresa [name] is a 32 y.o. female who presents for follow up medication management after diagnosis of DVT on 01/24/23. She broke her ankle while on vacation in [place], then she drove home 10 hours in a splint. She then experienced worsening pain and tingling in her RLE and went to the ED 01/24/23. US showed acute DVT involving the right posterior tibial veins. She was very anxious about the DVT and was referred to the DVT Clinic for follow up. Last seen in DVT Clinic 02/01/23 at which time Xarelto was continued.   Today patient reports her leg is feeling much better as it relates to the blood clot and to her ankle fracture. She has experienced significant heavy menstrual bleeding while on Xarelto. She skipped 1-2 days of Xarelto during last month's period because the bleeding was so heavy. Says she is looking forward to finishing treatment with Xarelto for her periods to go back to normal. Otherwise denies abnormal bleeding or bruising and other missed doses of Xarelto. She is smoking about 3 cigarettes per day.   Positive Thrombotic Risk Factors: Recent trauma (within 3 months), Sedentary journey lasting >8 hours within 4 weeks, Smoking, Obesity Bleeding Risk Factors: Anticoagulant therapy  Negative Thrombotic Risk Factors: Previous VTE, Recent surgery (within 3 months), Recent admission to hospital with acute illness (within 3 months), Paralysis, paresis, or recent plaster cast immobilization of lower extremity, Central venous catheterization, Bed rest >72 hours within 3 months, Pregnancy, Within 6 weeks  postpartum, Recent cesarean section (within 3 months), Estrogen therapy, Testosterone therapy, Erythropoiesis-stimulating agent, Recent COVID diagnosis (within 3 months), Active cancer, Non-malignant, chronic inflammatory condition, Known thrombophilic condition, Older age  Rx Insurance Coverage: Medicaid Rx Affordability: Xarelto is $4/month Preferred Pharmacy: She received Xarelto refills from [facility] via delivery.  Past Medical History:  Diagnosis Date   Achalasia    Anxiety    Asthma    Chlamydia age 32   Depression    GERD (gastroesophageal reflux disease)    Left fibular fracture 12/2011   referred by ED to outpt ortho. non-surgical mgt.    Obesity (BMI 30-39.9) 07/2013    Past Surgical History:  Procedure Laterality Date   BOTOX INJECTION N/A 01/26/2014   Procedure: BOTOX INJECTION;  Surgeon: Hart Carwin, MD;  Location: [facility];  Service: Endoscopy;  Laterality: N/A;   CESAREAN SECTION  12/2008   CESAREAN SECTION N/A 03/23/2015   Procedure: CESAREAN SECTION;  Surgeon: Silverio Lay, MD;  Location: [facility];  Service: Obstetrics;  Laterality: N/A;   ESOPHAGEAL MANOMETRY N/A 08/18/2013   Procedure: ESOPHAGEAL MANOMETRY (EM);  Surgeon: Hart Carwin, MD;  Location: [facility];  Service: Endoscopy;  Laterality: N/A;   ESOPHAGOGASTRODUODENOSCOPY N/A 01/26/2014   Procedure: ESOPHAGOGASTRODUODENOSCOPY (EGD);  Surgeon: Hart Carwin, MD;  Location: [facility];  Service: Endoscopy;  Laterality: N/A;   ESOPHAGOGASTRODUODENOSCOPY N/A 03/25/2015   Procedure:  EGD with Botox;  Surgeon: [name], MD;  Location: [facility];  Service: Endoscopy;  Laterality: N/A;   ESOPHAGOGASTRODUODENOSCOPY N/A 11/01/2015   Procedure:  ESOPHAGOGASTRODUODENOSCOPY (EGD);  Surgeon: Avel Peace, MD;  Location: [facility];  Service: General;  Laterality: N/A;   ESOPHAGOGASTRODUODENOSCOPY N/A 05/08/2017   Procedure: ESOPHAGOGASTRODUODENOSCOPY (EGD);  Surgeon: Iva Boop, MD;  Location: [facility] ENDOSCOPY;   Service: Endoscopy;  Laterality: N/A;   ESOPHAGOGASTRODUODENOSCOPY (EGD) WITH ESOPHAGEAL DILATION N/A 08/06/2013   Procedure: ESOPHAGOGASTRODUODENOSCOPY (EGD) WITH ESOPHAGEAL DILATION;  Surgeon: Hart Carwin, MD;  Location: [facility] ENDOSCOPY;  Service: Endoscopy;  Laterality: N/A;   FOREIGN BODY REMOVAL N/A 05/08/2017   Procedure: FOREIGN BODY REMOVAL;  Surgeon: Iva Boop, MD;  Location: [facility] ENDOSCOPY;  Service: Endoscopy;  Laterality: N/A;   HELLER MYOTOMY N/A 11/01/2015   Procedure: LAPAROSCOPIC HELLER MYOTOMY AND DOR FUNDOPLICATION;  Surgeon: Avel Peace, MD;  Location: [facility];  Service: General;  Laterality: N/A;   TONSILLECTOMY  07/2007   Dr Narda Bonds    Social History   Socioeconomic History   Marital status: Single    Spouse name: Not on file   Number of children: 2   Years of education: Not on file   Highest education level: Associate degree: occupational, Scientist, product/process development, or vocational program  Occupational History   Not on file  Tobacco Use   Smoking status: Every Day    Current packs/day: 0.00    Types: Cigarettes    Last attempt to quit: 10/24/2014    Years since quitting: 8.5   Smokeless tobacco: Never  Vaping Use   Vaping status: Never Used  Substance and Sexual Activity   Alcohol use: Yes    Comment: occassionally   Drug use: No   Sexual activity: Yes    Birth control/protection: None  Other Topics Concern   Not on file  Social History Narrative   Not on file   Social Determinants of Health   Financial Resource Strain: Not on file  Food Insecurity: Not on file  Transportation Needs: No Transportation Needs (03/20/2019)   PRAPARE - Administrator, Civil Service (Medical): No    Lack of Transportation (Non-Medical): No  Physical Activity: Not on file  Stress: Not on file  Social Connections: Not on file  Intimate Partner Violence: Not on file    Family History  Problem Relation Age of Onset   Hypertension Other    Colon cancer Neg Hx     Esophageal cancer Neg Hx    Rectal cancer Neg Hx    Stomach cancer Neg Hx    Colon polyps Neg Hx     Allergies as of 04/24/2023 - Review Complete 04/24/2023  Allergen Reaction Noted   Metronidazole Itching 05/07/2021    Current Outpatient Medications on File Prior to Encounter  Medication Sig Dispense Refill   calcium carbonate (TUMS) 500 MG chewable tablet Chew 1 tablet by mouth daily as needed for indigestion or heartburn.     hydrOXYzine (VISTARIL) 25 MG capsule Take 25 mg by mouth every 6 (six) hours as needed.     Multiple Vitamin (MULTIVITAMIN) capsule Take 1 capsule by mouth daily.     rivaroxaban (XARELTO) 20 MG TABS tablet Take 1 tablet (20 mg total) by mouth daily with supper. Take with food. 60 tablet 0   vortioxetine HBr (TRINTELLIX) 10 MG TABS tablet Take 10 mg by mouth daily.     traMADol (ULTRAM) 50 MG tablet Take 1 tablet (50 mg total) by mouth every 6 (six) hours as needed. (Patient not taking: Reported on 04/24/2023) 15 tablet 0   [DISCONTINUED] clonazePAM (KLONOPIN) 1 MG tablet Take 1 mg  by mouth daily.   2   [DISCONTINUED] Desvenlafaxine Succinate ER 25 MG TB24 Take 50 mg by mouth daily as needed (anxiety).   0   [DISCONTINUED] fluticasone (FLONASE) 50 MCG/ACT nasal spray Place 1 spray into both nostrils daily. 16 g 0   [DISCONTINUED] omeprazole (PRILOSEC) 40 MG capsule Take one pill 30 minutes before breakfast every morning. 30 capsule 5   No current facility-administered medications on file prior to encounter.   REVIEW OF SYSTEMS:  Review of Systems  Respiratory:  Negative for shortness of breath.   Cardiovascular:  Negative for chest pain, palpitations and leg swelling.  Musculoskeletal:  Negative for myalgias.  Neurological:  Negative for dizziness and tingling.   PHYSICAL EXAMINATION:  Vitals:   04/24/23 0931  BP: 104/61  Pulse: 91  SpO2: 99%    Physical Exam Vitals reviewed.  Cardiovascular:     Rate and Rhythm: Normal rate.  Pulmonary:     Effort:  Pulmonary effort is normal.  Musculoskeletal:        General: No swelling or tenderness.  Skin:    Findings: No bruising or erythema.  Psychiatric:        Mood and Affect: Mood normal.        Behavior: Behavior normal.        Thought Content: Thought content normal.   Villalta Score for Post-Thrombotic Syndrome: Pain: Mild Cramps: Absent Heaviness: Absent Paresthesia: Absent Pruritus: Absent Pretibial Edema: Absent Skin Induration: Absent Hyperpigmentation: Absent Redness: Absent Venous Ectasia: Absent Pain on calf compression: Absent Villalta Preliminary Score: 1 Is venous ulcer present?: No If venous ulcer is present and score is <15, then 15 points total are assigned: Absent Villalta Total Score: 1  LABS:  CBC     Component Value Date/Time   WBC 2.5 (L) 04/22/2020 1150   RBC 4.20 04/22/2020 1150   HGB 13.0 04/22/2020 1150   HCT 38.5 04/22/2020 1150   PLT 211 04/22/2020 1150   MCV 91.7 04/22/2020 1150   MCH 31.0 04/22/2020 1150   MCHC 33.8 04/22/2020 1150   RDW 13.1 04/22/2020 1150   LYMPHSABS 0.3 (L) 04/22/2020 1150   MONOABS 0.4 04/22/2020 1150   EOSABS 0.0 04/22/2020 1150   BASOSABS 0.0 04/22/2020 1150    Hepatic Function      Component Value Date/Time   PROT 6.8 04/22/2020 1150   ALBUMIN 3.8 04/22/2020 1150   AST 23 04/22/2020 1150   ALT 17 04/22/2020 1150   ALKPHOS 64 04/22/2020 1150   BILITOT 0.4 04/22/2020 1150    Renal Function   Lab Results  Component Value Date   CREATININE 0.68 04/22/2020   CREATININE 0.62 05/08/2017   CREATININE 0.65 05/07/2017    CrCl cannot be calculated (Patient's most recent lab result is older than the maximum 21 days allowed.).   Venous Studies:  02/01/23 doppler Examination is POSITIVE for acute, occlusive DVT within the RIGHT calf (posterior tibial) veins.  ASSESSMENT: Location of DVT: Right distal vein Cause of DVT: provoked by a transient risk factor - R ankle fracture followed by prolonged (10 hour) car  travel immediately preceding symptom onset. We have planned for 3 months of anticoagulation for a first provoked DVT. Anticoagulation with Xarelto was started on 01/24/23. Her pain and swelling have resolved. She has experienced heavy menstrual bleeding while on Xarelto, which is very common. She has about a week left of Xarelto tablets on hand so she will finish these out then discontinue. No need for follow up imaging. Discussed  smoking cessation. She is ready to quit. She plans to talk about this with her PCP at her next appointment with them but wanted to wait to work on quitting until she was done with Xarelto. It seems that her first and last cigarette of the day are the hardest to give up and that stress is otherwise her main trigger to smoking.   PLAN: -Patient is discharged from the DVT Clinic. -Discontinue anticoagulation with Xarelto, as patient has completed 3 months of treatment for provoked DVT.  -Counseled patient on future VTE risk reduction strategies and to inform all future providers of DVT history.  Follow up: PCP. No further follow up needed in DVT Clinic.  Pervis Hocking, PharmD, [place]y [place], CPP [facility] Clinical Pharmacist Practitioner Office: [phone]

## 2023-05-09 ENCOUNTER — Ambulatory Visit: Payer: Medicaid Other | Admitting: Nurse Practitioner

## 2023-06-10 ENCOUNTER — Ambulatory Visit (HOSPITAL_COMMUNITY)
Admission: EM | Admit: 2023-06-10 | Discharge: 2023-06-10 | Disposition: A | Discharge status: Home / Self Care | Payer: Medicaid Other | Attending: Family Medicine | Admitting: Family Medicine

## 2023-06-10 ENCOUNTER — Encounter (HOSPITAL_COMMUNITY): Payer: Self-pay | Admitting: *Deleted

## 2023-06-10 ENCOUNTER — Other Ambulatory Visit: Payer: Self-pay

## 2023-06-10 DIAGNOSIS — R109 Unspecified abdominal pain: Secondary | ICD-10-CM | POA: Diagnosis present

## 2023-06-10 DIAGNOSIS — N760 Acute vaginitis: Secondary | ICD-10-CM

## 2023-06-10 LAB — POCT URINALYSIS DIP (MANUAL ENTRY)
Bilirubin, UA: NEGATIVE
Blood, UA: NEGATIVE
Glucose, UA: NEGATIVE mg/dL
Ketones, POC UA: NEGATIVE mg/dL
Leukocytes, UA: NEGATIVE
Nitrite, UA: NEGATIVE
Protein Ur, POC: NEGATIVE mg/dL
Spec Grav, UA: 1.025 (ref 1.010–1.025)
Urobilinogen, UA: 1 U/dL
pH, UA: 7 (ref 5.0–8.0)

## 2023-06-10 LAB — POCT URINE PREGNANCY: Preg Test, Ur: NEGATIVE

## 2023-06-10 NOTE — ED Provider Notes (Addendum)
[facility]    CSN: [id number] Arrival date & time: 06/10/23  1728      History   Chief Complaint Chief Complaint  Patient presents with   bv   Vaginal Itching   Vaginal Discharge    HPI Theresa [name] is a 32 y.o. female.   The history is provided by the patient. No language interpreter was used.  Vaginal Itching This is a new (Vaginal discharge and itching x 1 month) problem. The problem occurs constantly. The problem has not changed since onset.Associated symptoms include abdominal pain. Nothing (Feels like this started when she got Mirena. This was taken out 8 yrs ago. However, her symptoms persists) aggravates the symptoms. Relieved by: Sitz bath and natural herbs.  Vaginal Discharge Associated symptoms: abdominal pain and vaginal itching   Abdominal Pain Pain location:  Suprapubic Pain quality: bloating   Pain quality comment:  Feels gassy. This started 2 days ago and feels it is related to her constipation Associated symptoms: vaginal discharge     Past Medical History:  Diagnosis Date   Achalasia    Anxiety    Asthma    Chlamydia age 40   Depression    GERD (gastroesophageal reflux disease)    Left fibular fracture 12/2011   referred by ED to outpt ortho. non-surgical mgt.    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   Status post repeat low transverse cesarean section 03/23/2015   Cesarean delivery delivered 03/23/2015   H/O: depression 11/24/2014   H/O: C-section - 2010 - NRFHRT 11/23/2014   Breast lump 11/23/2014    Infection due to enterococcus - at [facility] - treated 11/23/2014   Vomiting 08/06/2013   Tobacco abuse 08/06/2013   Achalasia, esophageal s/p laparoscopic Heller myotomy and Dor fundoplication 11/01/15 08/06/2013   Obesity (BMI 30-39.9) 07/2013    Past Surgical History:  Procedure Laterality Date   BOTOX INJECTION N/A 01/26/2014   Procedure: BOTOX INJECTION;  Surgeon: Hart Carwin, MD;  Location: [facility];  Service: Endoscopy;  Laterality: N/A;   CESAREAN SECTION  12/2008   CESAREAN SECTION N/A 03/23/2015   Procedure: CESAREAN SECTION;  Surgeon: Silverio Lay, MD;  Location: [facility];  Service: Obstetrics;  Laterality: N/A;   ESOPHAGEAL MANOMETRY N/A 08/18/2013   Procedure: ESOPHAGEAL MANOMETRY (EM);  Surgeon: Hart Carwin, MD;  Location: [facility];  Service: Endoscopy;  Laterality: N/A;   ESOPHAGOGASTRODUODENOSCOPY N/A 01/26/2014   Procedure: ESOPHAGOGASTRODUODENOSCOPY (EGD);  Surgeon: Hart Carwin, MD;  Location: [facility];  Service: Endoscopy;  Laterality: N/A;   ESOPHAGOGASTRODUODENOSCOPY N/A 03/25/2015   Procedure:  EGD with Botox;  Surgeon: [name], MD;  Location: [facility];  Service: Endoscopy;  Laterality: N/A;   ESOPHAGOGASTRODUODENOSCOPY N/A 11/01/2015   Procedure: ESOPHAGOGASTRODUODENOSCOPY (EGD);  Surgeon: Avel Peace, MD;  Location: [facility];  Service: General;  Laterality: N/A;   ESOPHAGOGASTRODUODENOSCOPY N/A 05/08/2017   Procedure: ESOPHAGOGASTRODUODENOSCOPY (EGD);  Surgeon: Iva Boop, MD;  Location: [facility];  Service: Endoscopy;  Laterality: N/A;   ESOPHAGOGASTRODUODENOSCOPY (EGD) WITH ESOPHAGEAL DILATION N/A  08/06/2013   Procedure: ESOPHAGOGASTRODUODENOSCOPY (EGD) WITH ESOPHAGEAL DILATION;  Surgeon: Hart Carwin, MD;  Location: [facility] END[facility]COPY;  Service: Endoscopy;  Laterality: N/A;   FOREIGN BODY REMOVAL N/A 05/08/2017   Procedure: FOREIGN BODY REMOVAL;  Surgeon: Iva Boop, MD;  Location: [facility]OSCOPY;  Service: Endoscopy;  Laterality: N/A;    HELLER MYOTOMY N/A 11/01/2015   Procedure: LAPAROSCOPIC HELLER MYOTOMY AND DOR FUNDOPLICATION;  Surgeon: Avel Peace, MD;  Location: [facility];  Service: General;  Laterality: N/A;   TONSILLECTOMY  07/2007   Dr Narda Bonds    OB History     Gravida  3   Para  2   Term  2   Preterm      AB  1   Living  2      SAB      IAB  1   Ectopic      Multiple  0   Live Births  2            Home Medications    Prior to Admission medications   Medication Sig Start Date End Date Taking? Authorizing Provider  hydrOXYzine (VISTARIL) 25 MG capsule Take 25 mg by mouth every 6 (six) hours as needed. 11/28/22  Yes [provider]  Multiple Vitamin (MULTIVITAMIN) capsule Take 1 capsule by mouth daily.   Yes [provider]  vortioxetine HBr (TRINTELLIX) 10 MG TABS tablet Take 10 mg by mouth daily.   Yes [provider]  calcium carbonate (TUMS) 500 MG chewable tablet Chew 1 tablet by mouth daily as needed for indigestion or heartburn.    [provider]  rivaroxaban (XARELTO) 20 MG TABS tablet Take 1 tablet (20 mg total) by mouth daily with supper. Take with food. 02/16/23   Leonie Douglas, MD  traMADol (ULTRAM) 50 MG tablet Take 1 tablet (50 mg total) by mouth every 6 (six) hours as needed. Patient not taking: Reported on 04/24/2023 01/24/23   Arthor Captain, PA-C  clonazePAM (KLONOPIN) 1 MG tablet Take 1 mg by mouth daily.  03/20/17 11/12/19  [provider]  Desvenlafaxine Succinate ER 25 MG TB24 Take 50 mg by mouth daily as needed (anxiety).  05/29/17 11/12/19  [provider]  fluticasone (FLONASE) 50 MCG/ACT nasal spray Place 1 spray into both nostrils daily. 06/06/18 11/12/19  Elpidio Anis, PA-C  omeprazole (PRILOSEC) 40 MG capsule Take one pill 30 minutes before breakfast every morning. 05/16/17 11/12/19  [name], MD    Family History Family History  Problem Relation Age of Onset   Hypertension Other    Colon cancer  Neg Hx    Esophageal cancer Neg Hx    Rectal cancer Neg Hx    Stomach cancer Neg Hx    Colon polyps Neg Hx     Social History Social History   Tobacco Use   Smoking status: Every Day    Current packs/day: 0.00    Types: Cigarettes    Last attempt to quit: 10/24/2014    Years since quitting: 8.6   Smokeless tobacco: Never  Vaping Use   Vaping status: Never Used  Substance Use Topics   Alcohol use: Yes    Comment: occassionally   Drug use: No     Allergies   Metronidazole   Review of Systems Review of Systems  Gastrointestinal:  Positive for abdominal pain.  Genitourinary:  Positive for vaginal discharge.     Physical Exam Triage Vital Signs ED Triage Vitals  Encounter Vitals  Group     BP 06/10/23 1822 124/81     Systolic BP Percentile --      Diastolic BP Percentile --      Pulse Rate 06/10/23 1822 84     Resp 06/10/23 1822 18     Temp 06/10/23 1822 98.3 F (36.8 C)     Temp src --      SpO2 06/10/23 1822 98 %     Weight --      Height --      Head Circumference --      Peak Flow --      Pain Score 06/10/23 1820 0     Pain Loc --      Pain Education --      Exclude from Growth Chart --    No data found.  Updated Vital Signs BP 124/81   Pulse 84   Temp 98.3 F (36.8 C)   Resp 18   LMP 05/14/2023   SpO2 98%   Visual Acuity Right Eye Distance:   Left Eye Distance:   Bilateral Distance:    Right Eye Near:   Left Eye Near:    Bilateral Near:     Physical Exam Vitals and nursing note reviewed.  Cardiovascular:     Rate and Rhythm: Normal rate and regular rhythm.     Heart sounds: Normal heart sounds. No murmur heard. Pulmonary:     Effort: Pulmonary effort is normal. No respiratory distress.     Breath sounds: Normal breath sounds. No wheezing.  Abdominal:     General: Abdomen is flat. Bowel sounds are normal. There is no distension.     Palpations: Abdomen is soft.     Tenderness: There is no abdominal tenderness.      UC  Treatments / Results  Labs (all labs ordered are listed, but only abnormal results are displayed) Labs Reviewed  POCT URINALYSIS DIP (MANUAL ENTRY)  POCT URINE PREGNANCY  CERVICOVAGINAL ANCILLARY ONLY    EKG   Radiology No results found.  Procedures Procedures (including critical care time)  Medications Ordered in UC Medications - No data to display  Initial Impression / Assessment and Plan / UC Course  I have reviewed the triage vital signs and the nursing notes.  Pertinent labs & imaging results that were available during my care of the patient were reviewed by me and considered in my medical decision making (see chart for details).  Clinical Course as of 06/10/23 1856  Wynelle Link Jun 10, 2023  8119 Vaginitis: Cervical swabs were collected for BV, candida, and STD Will call with her test result. [KE]  1856  Abdominal pain Likely constipation UA neg Also asked for a urine pregnancy test, which was negative She will obtain Miralax OTC for treatment [KE]    Clinical Course User Index [KE] Doreene Eland, MD    Final Clinical Impressions(s) / UC Diagnoses   Final diagnoses:  Vaginitis and vulvovaginitis  Abdominal pain, unspecified abdominal location     Discharge Instructions      It was nice seeing you. I will call you soon with your test results. Your pregnancy test is negative.      ED Prescriptions   None    PDMP not reviewed this encounter.   Doreene Eland, MD 06/10/23 1478    Doreene Eland, MD 06/10/23 [phone]

## 2023-06-10 NOTE — ED Triage Notes (Signed)
Pt reports she is pretty sure she has BV. Pt also has vag. Discharge and vag itching.

## 2023-06-10 NOTE — Discharge Instructions (Signed)
It was nice seeing you. I will call you soon with your test results. Your pregnancy test is negative.

## 2023-06-11 ENCOUNTER — Other Ambulatory Visit: Payer: Self-pay | Admitting: Family Medicine

## 2023-06-11 LAB — CERVICOVAGINAL ANCILLARY ONLY
Bacterial Vaginitis (gardnerella): POSITIVE — AB
Candida Glabrata: NEGATIVE
Candida Vaginitis: NEGATIVE
Chlamydia: NEGATIVE
Comment: NEGATIVE
Comment: NEGATIVE
Comment: NEGATIVE
Comment: NEGATIVE
Comment: NEGATIVE
Comment: NORMAL
Neisseria Gonorrhea: NEGATIVE
Trichomonas: NEGATIVE

## 2023-06-11 MED ORDER — TINIDAZOLE 500 MG PO TABS: 2.0000 g | ORAL_TABLET | Freq: Every day | ORAL | 0 refills | Status: AC

## 2023-06-11 MED ORDER — FLUCONAZOLE 150 MG PO TABS: 150.0000 mg | ORAL_TABLET | Freq: Once | ORAL | 0 refills | Status: AC

## 2023-06-11 NOTE — Addendum Note (Signed)
Addended by: Janit Pagan T on: 06/11/2023 05:49 PM   Modules accepted: Orders

## 2023-06-11 NOTE — Progress Notes (Signed)
I called and discussed her +BV result. She said she does not take Metronidazole due to intolerance but had done well on Tinidazole. I advised her they are the same family of medication. However, she said that is what she had used, and she tolerated it well. Tinidazole e-scribed.

## 2023-06-11 NOTE — Telephone Encounter (Signed)
Late entry - she also requested Diflucan to use after completing Tinidazole.

## 2023-06-12 ENCOUNTER — Telehealth (HOSPITAL_COMMUNITY): Payer: Self-pay | Admitting: *Deleted

## 2023-06-12 NOTE — Telephone Encounter (Signed)
Pt called and and asked if there was an alt to the flagyl since she is allergic. She was given Tinidazole which isnt covered by [employer] she will call pharm to see the out of pocket cost and pay for the med.

## 2023-06-13 ENCOUNTER — Telehealth: Payer: Self-pay | Admitting: Internal Medicine

## 2023-06-13 NOTE — Telephone Encounter (Signed)
No refill. This is for BV, looks like it was sent in by urgent care she went to earlier this week. If she is still having symptoms, she will need to be seen in clinic since I did not initially prescribe this for her.

## 2023-06-13 NOTE — Telephone Encounter (Signed)
Prescription Request  06/13/2023  LOV: 02/12/2023  What is the name of the medication or equipment? tinidazole (TINDAMAX) 500 MG tablet [[id number]]   Have you contacted your pharmacy to request a refill? No   Which pharmacy would you like this sent to?  [id number] [place], [place] - [phone] AT [facility] & [address] [place] [address] Phone: [phone] Fax: [phone]    Patient notified that their request is being sent to the clinical staff for review and that they should receive a response within 2 business days.   Please advise at Mobile [phone] (mobile)

## 2023-06-13 NOTE — Telephone Encounter (Signed)
Is it ok to send in refill?

## 2023-06-19 NOTE — Telephone Encounter (Signed)
Patient informed of message verbalized understanding  ?

## 2023-06-19 NOTE — Telephone Encounter (Signed)
We cannot do the PA on this as I did not prescribe this to her. It has to come from the urgent care that prescribed her the medication. Insurance would question why we are doing the PA when we were not the ones who saw her or treated her. She needs to discuss this with the urgent care provider.

## 2023-08-14 NOTE — Progress Notes (Deleted)
 ASSESSMENT    Brief Narrative:  33 y.o.  female known to Dr. Marland Kitchen  with a past medical history not limited to ***    See [facility] for any additional medical & surgical history   PLAN         HPI   Chief complaint :          Procedure risk assessment:  No history of CHF.  No supplemental 02 use at home.  Not a known difficult airway Anticoagulant:     GI History / Pertinent GI Studies   **All endoscopic studies may not be included here          Latest Ref Rng & Units 04/22/2020   11:50 AM 05/08/2017    1:40 PM 10/26/2015   11:45 AM  Hepatic Function  Total Protein 6.5 - 8.1 g/dL 6.8  7.4  7.7   Albumin 3.5 - 5.0 g/dL 3.8  3.9  4.3   AST 15 - 41 U/L 23  17  16    ALT 0 - 44 U/L 17  13  12    Alk Phosphatase 38 - 126 U/L 64  86  71   Total Bilirubin 0.3 - 1.2 mg/dL 0.4  0.7  0.5        Latest Ref Rng & Units 04/22/2020   11:50 AM 05/08/2017    1:40 PM 05/07/2017    8:01 PM  CBC  WBC 4.0 - 10.5 K/uL 2.5  6.5  7.0   Hemoglobin 12.0 - 15.0 g/dL 09.8  11.9  14.7   Hematocrit 36.0 - 46.0 % 38.5  41.6  44.3   Platelets 150 - 400 K/uL 211  231  210      Past Medical History:  Diagnosis Date   Achalasia    Anxiety    Asthma    Chlamydia age 55   Depression    GERD (gastroesophageal reflux disease)    Left fibular fracture 12/2011   referred by ED to outpt ortho. non-surgical mgt.    Obesity (BMI 30-39.9) 07/2013    Past Surgical History:  Procedure Laterality Date   BOTOX INJECTION N/A 01/26/2014   Procedure: BOTOX INJECTION;  Surgeon: Hart Carwin, MD;  Location: [facility];  Service: Endoscopy;  Laterality: N/A;   CESAREAN SECTION  12/2008   CESAREAN SECTION N/A 03/23/2015   Procedure: CESAREAN SECTION;  Surgeon: Silverio Lay, MD;  Location: [facility];  Service: Obstetrics;  Laterality: N/A;   ESOPHAGEAL MANOMETRY N/A 08/18/2013   Procedure: ESOPHAGEAL MANOMETRY (EM);  Surgeon: Hart Carwin, MD;  Location: [facility];  Service: Endoscopy;   Laterality: N/A;   ESOPHAGOGASTRODUODENOSCOPY N/A 01/26/2014   Procedure: ESOPHAGOGASTRODUODENOSCOPY (EGD);  Surgeon: Hart Carwin, MD;  Location: [facility];  Service: Endoscopy;  Laterality: N/A;   ESOPHAGOGASTRODUODENOSCOPY N/A 03/25/2015   Procedure:  EGD with Botox;  Surgeon: [name], MD;  Location: [facility];  Service: Endoscopy;  Laterality: N/A;   ESOPHAGOGASTRODUODENOSCOPY N/A 11/01/2015   Procedure: ESOPHAGOGASTRODUODENOSCOPY (EGD);  Surgeon: Avel Peace, MD;  Location: [facility];  Service: General;  Laterality: N/A;   ESOPHAGOGASTRODUODENOSCOPY N/A 05/08/2017   Procedure: ESOPHAGOGASTRODUODENOSCOPY (EGD);  Surgeon: Iva Boop, MD;  Location: [facility];  Service: Endoscopy;  Laterality: N/A;   ESOPHAGOGASTRODUODENOSCOPY (EGD) WITH ESOPHAGEAL DILATION N/A 08/06/2013   Procedure: ESOPHAGOGASTRODUODENOSCOPY (EGD) WITH ESOPHAGEAL DILATION;  Surgeon: Hart Carwin, MD;  Location: [facility] END[facility]COPY;  Service: Endoscopy;  Laterality: N/A;   FOREIGN BODY REMOVAL N/A 05/08/2017  Procedure: FOREIGN BODY REMOVAL;  Surgeon: Iva Boop, MD;  Location: [facility]COPY;  Service: Endoscopy;  Laterality: N/A;   HELLER MYOTOMY N/A 11/01/2015   Procedure: LAPAROSCOPIC HELLER MYOTOMY AND DOR FUNDOPLICATION;  Surgeon: Avel Peace, MD;  Location: [facility];  Service: General;  Laterality: N/A;   TONSILLECTOMY  07/2007   Dr Narda Bonds    Family History  Problem Relation Age of Onset   Hypertension Other    Colon cancer Neg Hx    Esophageal cancer Neg Hx    Rectal cancer Neg Hx    Stomach cancer Neg Hx    Colon polyps Neg Hx     Current Medications, Allergies, Family History and Social History were reviewed in [employer] electronic medical record.     Current Outpatient Medications  Medication Sig Dispense Refill   calcium carbonate (TUMS) 500 MG chewable tablet Chew 1 tablet by mouth daily as needed for indigestion or heartburn.     hydrOXYzine (VISTARIL) 25 MG capsule  Take 25 mg by mouth every 6 (six) hours as needed.     Multiple Vitamin (MULTIVITAMIN) capsule Take 1 capsule by mouth daily.     rivaroxaban (XARELTO) 20 MG TABS tablet Take 1 tablet (20 mg total) by mouth daily with supper. Take with food. 60 tablet 0   traMADol (ULTRAM) 50 MG tablet Take 1 tablet (50 mg total) by mouth every 6 (six) hours as needed. (Patient not taking: Reported on 04/24/2023) 15 tablet 0   vortioxetine HBr (TRINTELLIX) 10 MG TABS tablet Take 10 mg by mouth daily.     No current facility-administered medications for this visit.    Review of Systems: No chest pain. No shortness of breath. No urinary complaints.    Physical Exam  There were no vitals filed for this visit. Wt Readings from Last 3 Encounters:  02/12/23 195 lb 6.4 oz (88.6 kg)  02/01/23 191 lb 11.2 oz (87 kg)  01/24/23 185 lb (83.9 kg)    There were no vitals taken for this visit. Constitutional:  Pleasant, generally well appearing ***female in no acute distress. Psychiatric: Normal mood and affect. Behavior is normal. EENT: Pupils normal.  Conjunctivae are normal. No scleral icterus. Neck supple.  Cardiovascular: Normal rate, regular rhythm.  Pulmonary/chest: Effort normal and breath sounds normal. No wheezing, rales or rhonchi. Abdominal: Soft, nondistended, nontender. Bowel sounds active throughout. There are no masses palpable. No hepatomegaly. Neurological: Alert and oriented to person place and time.  Extremities: *** edema  Willette Cluster, NP  08/14/2023, 10:11 PM  Cc:  Salvatore Decent, FNP

## 2023-08-15 ENCOUNTER — Ambulatory Visit: Payer: Medicaid Other | Admitting: Nurse Practitioner

## 2023-10-29 ENCOUNTER — Encounter: Payer: Self-pay | Admitting: Internal Medicine

## 2023-10-29 ENCOUNTER — Other Ambulatory Visit (INDEPENDENT_AMBULATORY_CARE_PROVIDER_SITE_OTHER)

## 2023-10-29 ENCOUNTER — Ambulatory Visit (INDEPENDENT_AMBULATORY_CARE_PROVIDER_SITE_OTHER): Admitting: Internal Medicine

## 2023-10-29 ENCOUNTER — Other Ambulatory Visit (HOSPITAL_COMMUNITY)
Admission: RE | Admit: 2023-10-29 | Discharge: 2023-10-29 | Disposition: A | Discharge status: Home / Self Care | Source: Ambulatory Visit | Attending: Internal Medicine | Admitting: Internal Medicine

## 2023-10-29 VITALS — BP 120/82 | HR 83 | Temp 98.7°F | Ht 65.0 in | Wt 206.4 lb

## 2023-10-29 DIAGNOSIS — Z1159 Encounter for screening for other viral diseases: Secondary | ICD-10-CM | POA: Diagnosis not present

## 2023-10-29 DIAGNOSIS — Z124 Encounter for screening for malignant neoplasm of cervix: Secondary | ICD-10-CM | POA: Insufficient documentation

## 2023-10-29 DIAGNOSIS — Z0000 Encounter for general adult medical examination without abnormal findings: Secondary | ICD-10-CM | POA: Diagnosis not present

## 2023-10-29 DIAGNOSIS — N898 Other specified noninflammatory disorders of vagina: Secondary | ICD-10-CM | POA: Insufficient documentation

## 2023-10-29 DIAGNOSIS — Z113 Encounter for screening for infections with a predominantly sexual mode of transmission: Secondary | ICD-10-CM | POA: Diagnosis not present

## 2023-10-29 DIAGNOSIS — Z114 Encounter for screening for human immunodeficiency virus [HIV]: Secondary | ICD-10-CM | POA: Diagnosis not present

## 2023-10-29 DIAGNOSIS — Z13220 Encounter for screening for lipoid disorders: Secondary | ICD-10-CM

## 2023-10-29 LAB — COMPREHENSIVE METABOLIC PANEL WITH GFR
ALT: 12 U/L (ref 0–35)
AST: 15 U/L (ref 0–37)
Albumin: 4.3 g/dL (ref 3.5–5.2)
Alkaline Phosphatase: 80 U/L (ref 39–117)
BUN: 10 mg/dL (ref 6–23)
CO2: 28 meq/L (ref 19–32)
Calcium: 9 mg/dL (ref 8.4–10.5)
Chloride: 103 meq/L (ref 96–112)
Creatinine, Ser: 0.71 mg/dL (ref 0.40–1.20)
GFR: 110.64 mL/min (ref >60.00)
Glucose, Bld: 76 mg/dL (ref 70–99)
Potassium: 3.2 meq/L — ABNORMAL LOW (ref 3.5–5.1)
Sodium: 139 meq/L (ref 135–145)
Total Bilirubin: 0.5 mg/dL (ref 0.2–1.2)
Total Protein: 7.4 g/dL (ref 6.0–8.3)

## 2023-10-29 LAB — CBC WITH DIFFERENTIAL/PLATELET
Basophils Absolute: 0.1 10*3/uL (ref 0.0–0.1)
Basophils Relative: 0.8 % (ref 0.0–3.0)
Eosinophils Absolute: 0.2 10*3/uL (ref 0.0–0.7)
Eosinophils Relative: 3.1 % (ref 0.0–5.0)
HCT: 40.8 % (ref 36.0–46.0)
Hemoglobin: 13.9 g/dL (ref 12.0–15.0)
Lymphocytes Relative: 21.9 % (ref 12.0–46.0)
Lymphs Abs: 1.5 10*3/uL (ref 0.7–4.0)
MCHC: 34.1 g/dL (ref 30.0–36.0)
MCV: 92.2 fl (ref 78.0–100.0)
Monocytes Absolute: 0.4 10*3/uL (ref 0.1–1.0)
Monocytes Relative: 5.7 % (ref 3.0–12.0)
Neutro Abs: 4.7 10*3/uL (ref 1.4–7.7)
Neutrophils Relative %: 68.5 % (ref 43.0–77.0)
Platelets: 243 10*3/uL (ref 150.0–400.0)
RBC: 4.42 Mil/uL (ref 3.87–5.11)
RDW: 13.5 % (ref 11.5–15.5)
WBC: 6.8 10*3/uL (ref 4.0–10.5)

## 2023-10-29 LAB — TSH: TSH: 0.78 u[IU]/mL (ref 0.35–5.50)

## 2023-10-29 LAB — LIPID PANEL
Cholesterol: 164 mg/dL (ref 0–200)
HDL: 70.3 mg/dL (ref >39.00)
LDL Cholesterol: 77 mg/dL (ref 0–99)
NonHDL: 93.75
Total CHOL/HDL Ratio: 2
Triglycerides: 83 mg/dL (ref 0.0–149.0)
VLDL: 16.6 mg/dL (ref 0.0–40.0)

## 2023-10-29 MED ORDER — FLUCONAZOLE 150 MG PO TABS
ORAL_TABLET | ORAL | 0 refills | Status: DC
Start: 2023-10-29 — End: 2023-11-20

## 2023-10-29 NOTE — Addendum Note (Signed)
 Addended by: Genevive Ket D on: 10/29/2023 10:25 AM   Modules accepted: Orders

## 2023-10-29 NOTE — Progress Notes (Signed)
 Subjective:   Theresa [name] [birth year]  10/29/2023   CC: Chief Complaint  Patient presents with   Annual Exam    Fasting     HPI: Theresa [name] is a 33 y.o. female who presents for a routine health maintenance exam.  Labs collected at time of visit.   Patient does report persistent vaginal itching. Hx of BV and yeast infections. No recent sexual activity. Would like to be screened for STD's.   HEALTH SCREENINGS: - Pap smear: pap done - Mammogram (40+): done prior for breast cyst - Colonoscopy (45+): Not applicable  - Bone Density (65+): Not applicable  - Lung CA screening with low-dose CT:  Not applicable Adults age 68-80 who are current cigarette smokers or quit within the last 15 years. Must have 20 pack year history.   Depression and Anxiety Screen done today and results listed below:     02/12/2023    8:34 AM  Depression screen PHQ 2/9  Decreased Interest 2  Down, Depressed, Hopeless 1  PHQ - 2 Score 3  Altered sleeping 1  Tired, decreased energy 1  Change in appetite 1  Feeling bad or failure about yourself  1  Trouble concentrating 1  Moving slowly or fidgety/restless 0  Suicidal thoughts 0  PHQ-9 Score 8  Difficult doing work/chores Not difficult at all      02/12/2023    8:35 AM  GAD 7 : Generalized Anxiety Score  Nervous, Anxious, on Edge 1  Control/stop worrying 1  Worry too much - different things 1  Trouble relaxing 1  Restless 0  Easily annoyed or irritable 1  Afraid - awful might happen 0  Total GAD 7 Score 5  Anxiety Difficulty Not difficult at all    IMMUNIZATIONS: - Tdap: Tetanus vaccination status reviewed: last tetanus booster within 10 years. - Influenza: Postponed to flu season   Past medical history, surgical history, medications, allergies, family history and social history reviewed with patient today and changes made to appropriate areas of the chart.   Past Medical History:  Diagnosis Date   Achalasia    Acute deep vein  thrombosis (DVT) of tibial vein of right lower extremity (HCC) 02/01/2023   Anxiety    Asthma    Chlamydia age 33   Depression    Esophageal obstruction due to food impaction    GERD (gastroesophageal reflux disease)    Infection due to enterococcus - at [facility] - treated 11/23/2014   Left fibular fracture 12/16/2011   referred by ED to outpt ortho. non-surgical mgt.    Obesity (BMI 30-39.9) 07/17/2013    Past Surgical History:  Procedure Laterality Date   BOTOX INJECTION N/A 01/26/2014   Procedure: BOTOX INJECTION;  Surgeon: Hart Carwin, MD;  Location: [facility];  Service: Endoscopy;  Laterality: N/A;   CESAREAN SECTION  12/2008   CESAREAN SECTION N/A 03/23/2015   Procedure: CESAREAN SECTION;  Surgeon: Silverio Lay, MD;  Location: [facility];  Service: Obstetrics;  Laterality: N/A;   ESOPHAGEAL MANOMETRY N/A 08/18/2013   Procedure: ESOPHAGEAL MANOMETRY (EM);  Surgeon: Hart Carwin, MD;  Location: [facility];  Service: Endoscopy;  Laterality: N/A;   ESOPHAGOGASTRODUODENOSCOPY N/A 01/26/2014   Procedure: ESOPHAGOGASTRODUODENOSCOPY (EGD);  Surgeon: Hart Carwin, MD;  Location: [facility];  Service: Endoscopy;  Laterality: N/A;   ESOPHAGOGASTRODUODENOSCOPY N/A 03/25/2015   Procedure:  EGD with Botox;  Surgeon: [name], MD;  Location: [facility];  Service: Endoscopy;  Laterality: N/A;   ESOPHAGOGASTRODUODENOSCOPY N/A  11/01/2015   Procedure: ESOPHAGOGASTRODUODENOSCOPY (EGD);  Surgeon: Adalberto Hollow, MD;  Location: [facility];  Service: General;  Laterality: N/A;   ESOPHAGOGASTRODUODENOSCOPY N/A 05/08/2017   Procedure: ESOPHAGOGASTRODUODENOSCOPY (EGD);  Surgeon: Kenney Peacemaker, MD;  Location: [facility] END[facility]COPY;  Service: Endoscopy;  Laterality: N/A;   ESOPHAGOGASTRODUODENOSCOPY (EGD) WITH ESOPHAGEAL DILATION N/A 08/06/2013   Procedure: ESOPHAGOGASTRODUODENOSCOPY (EGD) WITH ESOPHAGEAL DILATION;  Surgeon: Pietro Bridegroom, MD;  Location: [facility] ENDOSCOPY;  Service: Endoscopy;  Laterality: N/A;    FOREIGN BODY REMOVAL N/A 05/08/2017   Procedure: FOREIGN BODY REMOVAL;  Surgeon: Kenney Peacemaker, MD;  Location: [facility] ENDOSCOPY;  Service: Endoscopy;  Laterality: N/A;   HELLER MYOTOMY N/A 11/01/2015   Procedure: LAPAROSCOPIC HELLER MYOTOMY AND DOR FUNDOPLICATION;  Surgeon: Adalberto Hollow, MD;  Location: [facility];  Service: General;  Laterality: N/A;   TONSILLECTOMY  07/2007   Dr Starling Eck    Current Outpatient Medications on File Prior to Visit  Medication Sig   hydrOXYzine (VISTARIL) 25 MG capsule Take 25 mg by mouth every 6 (six) hours as needed.   Multiple Vitamin (MULTIVITAMIN) capsule Take 1 capsule by mouth daily.   calcium carbonate (TUMS) 500 MG chewable tablet Chew 1 tablet by mouth daily as needed for indigestion or heartburn.   [DISCONTINUED] clonazePAM (KLONOPIN) 1 MG tablet Take 1 mg by mouth daily.    [DISCONTINUED] Desvenlafaxine Succinate ER 25 MG TB24 Take 50 mg by mouth daily as needed (anxiety).    [DISCONTINUED] fluticasone (FLONASE) 50 MCG/ACT nasal spray Place 1 spray into both nostrils daily.   [DISCONTINUED] omeprazole (PRILOSEC) 40 MG capsule Take one pill 30 minutes before breakfast every morning.   No current facility-administered medications on file prior to visit.    Allergies  Allergen Reactions   Metronidazole Itching     Social History   Socioeconomic History   Marital status: Single    Spouse name: Not on file   Number of children: 2   Years of education: Not on file   Highest education level: Associate degree: occupational, Scientist, product/process development, or vocational program  Occupational History   Not on file  Tobacco Use   Smoking status: Every Day    Current packs/day: 0.00    Types: Cigarettes    Last attempt to quit: 10/24/2014    Years since quitting: 9.0   Smokeless tobacco: Never  Vaping Use   Vaping status: Never Used  Substance and Sexual Activity   Alcohol use: Yes    Comment: occassionally   Drug use: No   Sexual activity: Yes    Birth  control/protection: None  Other Topics Concern   Not on file  Social History Narrative   Not on file   Social Drivers of Health   Financial Resource Strain: Not on file  Food Insecurity: Not on file  Transportation Needs: No Transportation Needs (03/20/2019)   PRAPARE - Administrator, Civil Service (Medical): No    Lack of Transportation (Non-Medical): No  Physical Activity: Not on file  Stress: Not on file  Social Connections: Not on file  Intimate Partner Violence: Not on file   Social History   Tobacco Use  Smoking Status Every Day   Current packs/day: 0.00   Types: Cigarettes   Last attempt to quit: 10/24/2014   Years since quitting: 9.0  Smokeless Tobacco Never   Social History   Substance and Sexual Activity  Alcohol Use Yes   Comment: occassionally    Family History  Problem Relation Age of Onset  Hypertension Other    Colon cancer Neg Hx    Esophageal cancer Neg Hx    Rectal cancer Neg Hx    Stomach cancer Neg Hx    Colon polyps Neg Hx      ROS: Denies fever, fatigue, unexplained weight loss/gain, hearing or vision changes, cardiac or respiratory complaints. Denies neurological deficits, musculoskeletal complaints, gastrointestinal or genitourinary complaints, mental health complaints, and skin changes.   Objective:   Today's Vitals   10/29/23 0922  BP: 120/82  Pulse: 83  Temp: 98.7 F (37.1 C)  TempSrc: Temporal  SpO2: 99%  Weight: 206 lb 6.4 oz (93.6 kg)  Height: 5\' 5"  (1.651 m)    GENERAL APPEARANCE: Well-appearing, in NAD. Well nourished.  SKIN: Pink, warm and dry. Turgor normal. No rash, lesion, ulceration, or ecchymoses. Hair evenly distributed.  HEENT: HEAD: Normocephalic.  EYES: PERRLA. EOMI. Lids intact w/o defect. Sclera white, Conjunctiva pink w/o exudate.  EARS: External ear w/o redness, swelling, masses or lesions. EAC clear. TM's intact, translucent w/o bulging, appropriate landmarks visualized. Appropriate acuity to  conversational tones.  NOSE: Septum midline w/o deformity. Nares patent, mucosa pink and non-inflamed w/o drainage. No sinus tenderness.  THROAT: Uvula midline. Oropharynx clear. Tonsils absent. Oral mucosa pink and moist.  NECK: Supple, Trachea midline. Full ROM w/o pain or tenderness. No lymphadenopathy. Thyroid non-tender w/o enlargement or palpable masses.  BREASTS: Breasts pendulous, symmetrical, and w/o palpable masses. Nipples everted and w/o discharge. No rash or skin retraction. No axillary or supraclavicular lymphadenopathy.  RESPIRATORY: Chest wall symmetrical w/o masses. Respirations even and non-labored. Breath sounds clear to auscultation bilaterally. No wheezes, rales, rhonchi, or crackles. CARDIAC: S1, S2 present, regular rate and rhythm. No gallops, murmurs, rubs, or clicks.Capillary refill <2 seconds. Peripheral pulses 2+ bilaterally. GI: Abdomen soft w/o distention. Normoactive bowel sounds. No palpable masses or tenderness. No guarding or rebound tenderness. Liver and spleen w/o tenderness or enlargement. No CVA tenderness.  GU:  External genitalia without erythema, lesions, or masses. No lymphadenopathy. Vaginal mucosa pink and moist without lesions, or ulcerations. White clumpy discharge present on vaginal mucosa.  Cervix pink without discharge. Cervical os closed. Uterus and adnexae palpable, not enlarged, and w/o tenderness. No palpable masses.  MSK: Muscle tone and strength appropriate for age, w/o atrophy or abnormal movement.  EXTREMITIES: Active ROM intact, w/o tenderness, crepitus, or contracture. No obvious joint deformities or effusions. No clubbing, edema, or cyanosis.  NEUROLOGIC: CN's II-XII intact. Motor strength symmetrical with no obvious weakness. No sensory deficits. Steady, even gait.  PSYCH/MENTAL STATUS: Alert, oriented x 3. Cooperative, appropriate mood and affect.   Chaperoned by Vallorie Gayer CMA   Assessment & Plan:  Encounter for general adult medical  examination without abnormal findings -     CBC with Differential/Platelet -     Comprehensive metabolic panel with GFR -     TSH -     Lipid panel  Cervical cancer screening -     Cytology - PAP  Need for hepatitis C screening test -     Hepatitis C antibody  Encounter for screening for HIV -     HIV Antibody (routine testing w rflx)  Screen for STD (sexually transmitted disease)  Vaginal itching -     Fluconazole; Take 1 tablet by mouth once. Repeat dose in 3 days if symptoms persist.  Dispense: 2 tablet; Refill: 0 -     Cervicovaginal ancillary only    Orders Placed This Encounter  Procedures   CBC with  Differential/Platelet   Comprehensive metabolic panel with GFR   TSH   Lipid panel   Hepatitis C antibody   HIV antibody (with reflex)    PATIENT COUNSELING:  - Encouraged a healthy well-balanced diet. Patient may adjust caloric intake to maintain or achieve ideal body weight. May reduce intake of dietary saturated fat and total fat and have adequate dietary potassium and calcium preferably from fresh fruits, vegetables, and low-fat dairy products.   - Stressed the importance of regular exercise  NEXT PREVENTATIVE PHYSICAL DUE IN 1 YEAR.  Return in about 1 year (around 10/28/2024) for Annual Physical Exam with fasting lab work and as needed.  Gavin Kast, FNP

## 2023-10-30 ENCOUNTER — Encounter: Payer: Self-pay | Admitting: Internal Medicine

## 2023-10-30 ENCOUNTER — Other Ambulatory Visit: Payer: Self-pay | Admitting: Internal Medicine

## 2023-10-30 DIAGNOSIS — E876 Hypokalemia: Secondary | ICD-10-CM

## 2023-10-30 LAB — HIV ANTIBODY (ROUTINE TESTING W REFLEX): HIV 1&2 Ab, 4th Generation: NONREACTIVE

## 2023-10-30 LAB — HEPATITIS C ANTIBODY: Hepatitis C Ab: NONREACTIVE

## 2023-10-30 MED ORDER — POTASSIUM CHLORIDE ER 20 MEQ PO TBCR
20.0000 meq | EXTENDED_RELEASE_TABLET | Freq: Every day | ORAL | 0 refills | Status: DC
Start: 2023-10-30 — End: 2024-04-25

## 2023-10-31 ENCOUNTER — Other Ambulatory Visit: Payer: Self-pay | Admitting: Internal Medicine

## 2023-10-31 ENCOUNTER — Encounter: Payer: Self-pay | Admitting: Internal Medicine

## 2023-10-31 ENCOUNTER — Other Ambulatory Visit (HOSPITAL_COMMUNITY): Payer: Self-pay

## 2023-10-31 DIAGNOSIS — N898 Other specified noninflammatory disorders of vagina: Secondary | ICD-10-CM

## 2023-10-31 DIAGNOSIS — N760 Acute vaginitis: Secondary | ICD-10-CM

## 2023-10-31 DIAGNOSIS — E669 Obesity, unspecified: Secondary | ICD-10-CM

## 2023-10-31 DIAGNOSIS — B9689 Other specified bacterial agents as the cause of diseases classified elsewhere: Secondary | ICD-10-CM

## 2023-10-31 LAB — CERVICOVAGINAL ANCILLARY ONLY
Bacterial Vaginitis (gardnerella): POSITIVE — AB
Candida Glabrata: NEGATIVE
Candida Vaginitis: NEGATIVE
Chlamydia: NEGATIVE
Comment: NEGATIVE
Comment: NEGATIVE
Comment: NEGATIVE
Comment: NEGATIVE
Comment: NEGATIVE
Comment: NORMAL
Neisseria Gonorrhea: NEGATIVE
Trichomonas: NEGATIVE

## 2023-10-31 MED ORDER — CLINDAMYCIN PHOSPHATE 2 % VA CREA
1.0000 | TOPICAL_CREAM | Freq: Every day | VAGINAL | 0 refills | Status: DC
Start: 2023-10-31 — End: 2023-11-07

## 2023-11-01 ENCOUNTER — Other Ambulatory Visit (HOSPITAL_COMMUNITY): Payer: Self-pay

## 2023-11-01 ENCOUNTER — Telehealth: Payer: Self-pay | Admitting: Pharmacy Technician

## 2023-11-01 DIAGNOSIS — B9689 Other specified bacterial agents as the cause of diseases classified elsewhere: Secondary | ICD-10-CM

## 2023-11-01 LAB — CYTOLOGY - PAP
Comment: NEGATIVE
Diagnosis: NEGATIVE
High risk HPV: NEGATIVE

## 2023-11-01 MED ORDER — CLEOCIN 100 MG VA SUPP
100.0000 mg | Freq: Every day | VAGINAL | 0 refills | Status: AC
Start: 2023-11-01 — End: 2023-11-04

## 2023-11-01 NOTE — Telephone Encounter (Signed)
 Pharmacy Patient Advocate Encounter   Received notification from CoverMyMeds that prior authorization for CLINDAMYCIN 2% VAGINAL CREAM is required/requested.   Insurance verification completed.   The patient is insured through [facility] .   Per test claim:  SEE BELOW is preferred by the insurance.  If suggested medication is appropriate, Please send in a new RX and discontinue this one. If not, please advise as to why it's not appropriate so that we may request a Prior Authorization. Please note, some preferred medications may still require a PA.  If the suggested medications have not been trialed and there are no contraindications to their use, the PA will not be submitted, as it will not be approved.

## 2023-11-01 NOTE — Telephone Encounter (Signed)
**Note De-identified  [place] Obfuscation** Please advise 

## 2023-11-01 NOTE — Addendum Note (Signed)
 Addended by: Gavin Kast on: 11/01/2023 12:06 PM   Modules accepted: Orders

## 2023-11-01 NOTE — Telephone Encounter (Signed)
 I have sent alternative that is preferred (Cleocin ovules)

## 2023-11-05 ENCOUNTER — Encounter (INDEPENDENT_AMBULATORY_CARE_PROVIDER_SITE_OTHER): Payer: Self-pay

## 2023-11-20 MED ORDER — CLINDAMYCIN PHOSPHATE 100 MG VA SUPP
100.0000 mg | Freq: Every day | VAGINAL | 0 refills | Status: DC
Start: 2023-11-20 — End: 2024-04-25

## 2023-11-20 MED ORDER — FLUCONAZOLE 150 MG PO TABS
ORAL_TABLET | ORAL | 0 refills | Status: DC
Start: 2023-11-20 — End: 2024-04-25

## 2023-11-20 NOTE — Addendum Note (Signed)
 Addended by: Gavin Kast on: 11/20/2023 12:25 PM   Modules accepted: Orders

## 2024-01-08 ENCOUNTER — Other Ambulatory Visit (HOSPITAL_COMMUNITY)
Admission: RE | Admit: 2024-01-08 | Discharge: 2024-01-08 | Disposition: A | Discharge status: Home / Self Care | Source: Ambulatory Visit | Attending: Family Medicine | Admitting: Family Medicine

## 2024-01-08 ENCOUNTER — Institutional Professional Consult (permissible substitution) (INDEPENDENT_AMBULATORY_CARE_PROVIDER_SITE_OTHER): Payer: Self-pay | Admitting: Internal Medicine

## 2024-01-08 ENCOUNTER — Encounter: Payer: Self-pay | Admitting: Family Medicine

## 2024-01-08 ENCOUNTER — Ambulatory Visit: Admitting: Family Medicine

## 2024-01-08 VITALS — BP 118/76 | HR 97 | Temp 97.3°F | Ht 65.0 in | Wt 202.0 lb

## 2024-01-08 DIAGNOSIS — B9689 Other specified bacterial agents as the cause of diseases classified elsewhere: Secondary | ICD-10-CM | POA: Diagnosis not present

## 2024-01-08 DIAGNOSIS — N760 Acute vaginitis: Secondary | ICD-10-CM | POA: Insufficient documentation

## 2024-01-08 MED ORDER — MOMETASONE FUROATE 0.1 % EX CREA
TOPICAL_CREAM | CUTANEOUS | 1 refills | Status: AC
Start: 2024-01-08 — End: ?

## 2024-01-08 NOTE — Progress Notes (Signed)
 [facility] [address] [place] [address] Dept: [phone] Dept Fax: [phone]  Office Visit  Subjective:    Patient ID: Theresa [name], female    DOB: [birth year], 33 y.o..   MRN: [id number]  Chief Complaint  Patient presents with   Vaginal Itching    C/o having vaginal itching x    History of Present Illness:  Patient is in today complaining of a 3-day history of a vaginal discharge with a fishy odor and some mild itching of her labia. Ms. Winterhalter notes she has had recurrent issues with BV over the past 8 years. She relates this to when she received her first Mirena IUD. However, she has had this out for a while now. She states she has had allergy issues with use of metronidazole . She had documented  BV in Nov. 2024 and April 2025. She has used short courses of boric acid vaginally in the past. Her last episode of BV was treated with vaginal clindamycin . She notes this did clear up the odor and discharge, though she continued ot have itching of her labia.  Past Medical History: Patient Active Problem List   Diagnosis Date Noted   Bacterial vaginosis- Recurrent 01/08/2024   Closed nondisplaced fracture of lateral malleolus of right fibula 02/12/2023   Asthma    Breast lump on left side at 3 o'clock position 03/20/2019   Achalasia 03/24/2015   GBS carrier 03/23/2015   H/O: depression 11/24/2014   H/O: C-section - 2010 - NRFHRT 11/23/2014   Tobacco abuse 08/06/2013   Achalasia, esophageal s/p laparoscopic Heller myotomy and Dor fundoplication 11/01/15 08/06/2013   Obesity (BMI 30-39.9) 07/2013   Past Surgical History:  Procedure Laterality Date   BOTOX  INJECTION N/A 01/26/2014   Procedure: BOTOX  INJECTION;  Surgeon: Princella CHRISTELLA Nida, MD;  Location: [facility];  Service: Endoscopy;  Laterality: N/A;   CESAREAN SECTION  12/2008   CESAREAN SECTION N/A 03/23/2015   Procedure: CESAREAN SECTION;  Surgeon: Nena App, MD;   Location: [facility];  Service: Obstetrics;  Laterality: N/A;   ESOPHAGEAL MANOMETRY N/A 08/18/2013   Procedure: ESOPHAGEAL MANOMETRY (EM);  Surgeon: Princella CHRISTELLA Nida, MD;  Location: [facility];  Service: Endoscopy;  Laterality: N/A;   ESOPHAGOGASTRODUODENOSCOPY N/A 01/26/2014   Procedure: ESOPHAGOGASTRODUODENOSCOPY (EGD);  Surgeon: Princella CHRISTELLA Nida, MD;  Location: [facility];  Service: Endoscopy;  Laterality: N/A;   ESOPHAGOGASTRODUODENOSCOPY N/A 03/25/2015   Procedure:  EGD with Botox ;  Surgeon: Toribio SHAUNNA Cedar, MD;  Location: [facility];  Service: Endoscopy;  Laterality: N/A;   ESOPHAGOGASTRODUODENOSCOPY N/A 11/01/2015   Procedure: ESOPHAGOGASTRODUODENOSCOPY (EGD);  Surgeon: Krystal Russell, MD;  Location: [facility];  Service: General;  Laterality: N/A;   ESOPHAGOGASTRODUODENOSCOPY N/A 05/08/2017   Procedure: ESOPHAGOGASTRODUODENOSCOPY (EGD);  Surgeon: Avram Lupita BRAVO, MD;  Location: [facility] ENDOSCOPY;  Service: Endoscopy;  Laterality: N/A;   ESOPHAGOGASTRODUODENOSCOPY (EGD) WITH ESOPHAGEAL DILATION N/A 08/06/2013   Procedure: ESOPHAGOGASTRODUODENOSCOPY (EGD) WITH ESOPHAGEAL DILATION;  Surgeon: Princella CHRISTELLA Nida, MD;  Location[facility] ENDOSCOPY;  Service: Endoscopy;  Laterality: N/A;   FOREIGN BODY REMOVAL N/A 05/08/2017   Procedure: FOREIGN BODY REMOVAL;  Surgeon: Avram Lupita BRAVO, MD;  Location: [facility] ENDOSCOPY;  Service: Endoscopy;  Laterality: N/A;   HELLER MYOTOMY N/A 11/01/2015   Procedure: LAPAROSCOPIC HELLER MYOTOMY AND DOR FUNDOPLICATION;  Surgeon: Krystal Russell, MD;  Location: [facility];  Service: General;  Laterality: N/A;   TONSILLECTOMY  07/2007   Dr Medford Angle   Family History  Problem Relation Age of  Onset   Hypertension Other    Colon cancer Neg Hx    Esophageal cancer Neg Hx    Rectal cancer Neg Hx    Stomach cancer Neg Hx    Colon polyps Neg Hx    Outpatient Medications Prior to Visit  Medication Sig Dispense Refill   calcium carbonate (TUMS) 500 MG chewable tablet Chew 1 tablet by mouth daily as  needed for indigestion or heartburn.     clindamycin  (CLEOCIN ) 100 MG vaginal suppository Place 1 suppository (100 mg total) vaginally at bedtime. 3 suppository 0   fluconazole  (DIFLUCAN ) 150 MG tablet Take 1 tablet by mouth once. Repeat dose in 3 days if symptoms persist. 2 tablet 0   hydrOXYzine (VISTARIL) 25 MG capsule Take 25 mg by mouth every 6 (six) hours as needed.     Multiple Vitamin (MULTIVITAMIN) capsule Take 1 capsule by mouth daily.     potassium chloride  20 MEQ TBCR Take 1 tablet (20 mEq total) by mouth daily for 5 days. 5 tablet 0   No facility-administered medications prior to visit.   Allergies  Allergen Reactions   Metronidazole  Itching     Objective:   Today's Vitals   01/08/24 0839  BP: 118/76  Pulse: 97  Temp: (!) 97.3 F (36.3 C)  TempSrc: Temporal  SpO2: 99%  Weight: 202 lb (91.6 kg)  Height: 5' 5 (1.651 m)   Body mass index is 33.61 kg/m.   General: Well developed, well nourished. No acute distress. Psych: Alert and oriented. Normal mood and affect.  Health Maintenance Due  Topic Date Due   Pneumococcal Vaccine 51-32 Years old (1 of 2 - PCV) Never done   Hepatitis B Vaccines (1 of 3 - 19+ 3-dose series) Never done   HPV VACCINES (1 - 3-dose SCDM series) Never done     Assessment & Plan:   Problem List Items Addressed This Visit       Genitourinary   Bacterial vaginosis- Recurrent - Primary   I suspect her current discharge and odor are likely due to recurrent BV. I will have Ms. Mikita self-swab for a cervicovaginal panel. If the BV is positive, I will prescribe tinadazole 500 mg bid for 7 days and boric acid vaginal suppositories at bedtime daily for 30 days. I suspect the labial itching to be due to lichen sclerosus. I will have her try using a topical steroid cream for 1 week.      Relevant Medications   mometasone (ELOCON) 0.1 % cream   Other Relevant Orders   Cervicovaginal ancillary only    Return if symptoms worsen or fail to  improve.   Garnette CHRISTELLA Simpler, MD

## 2024-01-08 NOTE — Assessment & Plan Note (Signed)
 I suspect her current discharge and odor are likely due to recurrent BV. I will have Theresa [name] self-swab for a cervicovaginal panel. If the BV is positive, I will prescribe tinadazole 500 mg bid for 7 days and boric acid vaginal suppositories at bedtime daily for 30 days. I suspect the labial itching to be due to lichen sclerosus. I will have her try using a topical steroid cream for 1 week.

## 2024-01-10 ENCOUNTER — Ambulatory Visit: Payer: Self-pay | Admitting: Family Medicine

## 2024-01-10 ENCOUNTER — Telehealth: Payer: Self-pay

## 2024-01-10 LAB — CERVICOVAGINAL ANCILLARY ONLY
Bacterial Vaginitis (gardnerella): NEGATIVE
Candida Glabrata: NEGATIVE
Candida Vaginitis: NEGATIVE
Comment: NEGATIVE
Comment: NEGATIVE
Comment: NEGATIVE
Comment: NEGATIVE
Trichomonas: NEGATIVE

## 2024-01-10 NOTE — Telephone Encounter (Signed)
 Left voicemail asking patient to return call at [phone].   Copied from CRM [id number]. Topic: Clinical - Lab/Test Results >> Jan 09, 2024  4:58 PM Shereese L wrote: Reason for CRM: patient is calling for lab results and would like a call back as soon as possible

## 2024-02-05 ENCOUNTER — Ambulatory Visit (INDEPENDENT_AMBULATORY_CARE_PROVIDER_SITE_OTHER): Admitting: Internal Medicine

## 2024-02-05 ENCOUNTER — Encounter (INDEPENDENT_AMBULATORY_CARE_PROVIDER_SITE_OTHER): Payer: Self-pay | Admitting: Internal Medicine

## 2024-02-05 VITALS — BP 115/79 | HR 66 | Temp 98.4°F | Ht 64.0 in | Wt 199.0 lb

## 2024-02-05 DIAGNOSIS — Z6834 Body mass index (BMI) 34.0-34.9, adult: Secondary | ICD-10-CM

## 2024-02-05 DIAGNOSIS — R5383 Other fatigue: Secondary | ICD-10-CM

## 2024-02-05 DIAGNOSIS — E66811 Obesity, class 1: Secondary | ICD-10-CM

## 2024-02-05 DIAGNOSIS — R0609 Other forms of dyspnea: Secondary | ICD-10-CM | POA: Diagnosis not present

## 2024-02-05 DIAGNOSIS — M545 Low back pain, unspecified: Secondary | ICD-10-CM

## 2024-02-05 DIAGNOSIS — F418 Other specified anxiety disorders: Secondary | ICD-10-CM

## 2024-02-05 DIAGNOSIS — K220 Achalasia of cardia: Secondary | ICD-10-CM | POA: Diagnosis not present

## 2024-02-05 NOTE — Progress Notes (Signed)
 [address] [place], [place], [place] [address] Office: [phone]  /  Fax: [phone]   Initial Consultation    Theresa [name] was seen in clinic today to evaluate for obesity. She is interested in losing weight to improve overall health and reduce the risk of weight related complications. She presents today to review program treatment options, initial physical assessment, and evaluation.    Anthropometrics and Bioimpedance Analysis   Body mass index is 34.16 kg/m. Body Fat Mass : 43 % Visceral Fat Mass Rating : 9   Obesity Related Diseases and Complications  Obesity Quality of Life and Psychosocial Complications: Depression and / or anxiety currently being treated with buspirone 10 mg TID , Reduced health-related quality of life, and Decrease physical activity and social participation  Cardiometabolic: DOE and Fatigue  Biomechanical: Low back pain   Weight Related History  She was referred by: PCP  When asked what they would like to accomplish? She states: Adopt a healthier eating pattern and lifestyle, Improve energy levels and physical activity, Improve existing medical conditions, and Improve quality of life  Weight history: Has been dealing with weight since childhood  Highest weight: 225  Contributing factors: family history of obesity, consumption of processed foods, moderate to high levels of stress, chronic skipping of meals, need for convenience due to lack of time, sedentary job, and hectic pace of life  Prior weight loss attempts: None  Current or previous pharmacotherapy: None  Response to medication: Never tried medications  Current nutrition plan: None  Greatest challenge with dieting: Expensive to eat healthy and does not like meal prep.  Current level of physical activity: Walking 40 minutes, three a week and Strength training 20 minutes, three  Barriers to Exercise: orthopedic problems- low back pain  Readiness and Motivation  On a scale from  0 to 10 How ready are you to make changes to your eating and physical activity to lose weight? 10 How important is it for you to lose weight right now ? 10 How confident are you that you can lose weight if you try? 10  Past Medical History   Past Medical History:  Diagnosis Date   Achalasia    Acute deep vein thrombosis (DVT) of tibial vein of right lower extremity (HCC) 02/01/2023   Anxiety    Asthma    Chlamydia age 78   Depression    Esophageal obstruction due to food impaction    GERD (gastroesophageal reflux disease)    Infection due to enterococcus - at [facility] - treated 11/23/2014   Left fibular fracture 12/16/2011   referred by ED to outpt ortho. non-surgical mgt.    Obesity (BMI 30-39.9) 07/17/2013     Objective    BP 115/79   Pulse 66   Temp 98.4 F (36.9 C)   Ht 5' 4 (1.626 m)   Wt 199 lb (90.3 kg)   LMP 01/31/2024   SpO2 96%   BMI 34.16 kg/m  She was weighed on the bioimpedance scale: Body mass index is 34.16 kg/m.    General:  Alert, oriented and cooperative. Patient is in no acute distress.  Respiratory: Normal respiratory effort, no problems with respiration noted   Gait: able to ambulate independently  Mental Status: Normal mood and affect. Normal behavior. Normal judgment and thought content.   Diagnostic Data Reviewed  BMET    Component Value Date/Time   NA 139 10/29/2023 1105   K 3.2 (L) 10/29/2023 1105   CL 103 10/29/2023 1105   CO2  28 10/29/2023 1105   GLUCOSE 76 10/29/2023 1105   BUN 10 10/29/2023 1105   CREATININE 0.71 10/29/2023 1105   CALCIUM 9.0 10/29/2023 1105   GFRNONAA >60 04/22/2020 1150   GFRAA >60 05/08/2017 1340   No results found for: HGBA1C No results found for: INSULIN CBC    Component Value Date/Time   WBC 6.8 10/29/2023 1105   RBC 4.42 10/29/2023 1105   HGB 13.9 10/29/2023 1105   HCT 40.8 10/29/2023 1105   PLT 243.0 10/29/2023 1105   MCV 92.2 10/29/2023 1105   MCH 31.0 04/22/2020 1150   MCHC 34.1 10/29/2023  1105   RDW 13.5 10/29/2023 1105   Iron/TIBC/Ferritin/ %Sat No results found for: IRON, TIBC, FERRITIN, IRONPCTSAT Lipid Panel     Component Value Date/Time   CHOL 164 10/29/2023 1105   TRIG 83.0 10/29/2023 1105   HDL 70.30 10/29/2023 1105   CHOLHDL 2 10/29/2023 1105   VLDL 16.6 10/29/2023 1105   LDLCALC 77 10/29/2023 1105   Hepatic Function Panel     Component Value Date/Time   PROT 7.4 10/29/2023 1105   ALBUMIN 4.3 10/29/2023 1105   AST 15 10/29/2023 1105   ALT 12 10/29/2023 1105   ALKPHOS 80 10/29/2023 1105   BILITOT 0.5 10/29/2023 1105      Component Value Date/Time   TSH 0.78 10/29/2023 1105    Medications  Outpatient Encounter Medications as of 02/05/2024  Medication Sig   calcium carbonate (TUMS) 500 MG chewable tablet Chew 1 tablet by mouth daily as needed for indigestion or heartburn.   clindamycin  (CLEOCIN ) 100 MG vaginal suppository Place 1 suppository (100 mg total) vaginally at bedtime.   fluconazole  (DIFLUCAN ) 150 MG tablet Take 1 tablet by mouth once. Repeat dose in 3 days if symptoms persist.   hydrOXYzine (VISTARIL) 25 MG capsule Take 25 mg by mouth every 6 (six) hours as needed.   mometasone  (ELOCON ) 0.1 % cream Apply to affected area of labia daily for 7 days   Multiple Vitamin (MULTIVITAMIN) capsule Take 1 capsule by mouth daily.   potassium chloride  20 MEQ TBCR Take 1 tablet (20 mEq total) by mouth daily for 5 days.   [DISCONTINUED] clonazePAM (KLONOPIN) 1 MG tablet Take 1 mg by mouth daily.    [DISCONTINUED] Desvenlafaxine Succinate ER 25 MG TB24 Take 50 mg by mouth daily as needed (anxiety).    [DISCONTINUED] fluticasone  (FLONASE ) 50 MCG/ACT nasal spray Place 1 spray into both nostrils daily.   [DISCONTINUED] omeprazole  (PRILOSEC) 40 MG capsule Take one pill 30 minutes before breakfast every morning.   No facility-administered encounter medications on file as of 02/05/2024.     Assessment and Plan   Achalasia, esophageal s/p laparoscopic  Heller myotomy and Dor fundoplication 11/01/15 Discuss weight loss of 10% body weight can potentially reduce symptomatology as symptoms were improved at lower weight.   Other fatigue Discuss weight loss of 10 % can decrease symptoms  Dyspnea on exertion Discuss weight loss of 10 % can decrease symptoms  Depression with anxiety Continue Buspirone as directed by PCP Discuss decreasing weight to improve self image   Low Back Pain without Sciatica Continue visits with chiropractor Discuss weight loss of 10 % can decrease symptoms  Class 1 obesity with serious comorbidity and body mass index (BMI) of 34.0 to 34.9 in adult, unspecified obesity type  Obesity Treatment and Action Plan:  We reviewed anthropometrics, biometrics, associated medical conditions and contributing factors with patient. she would benefit from a medically tailored reduced calorie nutrional  plan based on her REE (resting energy expenditure), which will be determined by indirect calorimetry.  We will also assess for cardiometabolic risk and nutritional derangements via fasting labs at intake appointment.    Patient will work on garnering support from family and friends to begin weight loss journey. Will work on eliminating or reducing the presence of highly palatable, calorie dense foods in the home. Will complete provided nutritional and psychosocial assessment questionnaire before the next appointment. Will be scheduled for indirect calorimetry to determine resting energy expenditure in a fasting state.  This will allow us  to create a reduced calorie, high-protein meal plan to promote loss of fat mass while preserving muscle mass. Will avoid skipping meals which may result in increased hunger signals and overeating at certain times. Will work on managing stress via relaxation methods as this may result in unhealthy eating patterns. Counseled on the health benefits of losing 5%-15% of total body weight. Was counseled on  nutritional approaches to weight loss and benefits of reducing processed foods and consuming plant-based foods and high quality protein as part of nutritional weight management. Was counseled on pharmacotherapy and role as an adjunct in weight management.   Education and Additional resources  She was weighed on the bioimpedance scale and results were discussed and documented in the synopsis.  We discussed obesity as a progressive, chronic disease and the importance of a more detailed evaluation of all the factors contributing to the disease.  We reviewed the basic principles in obesity management.   We discussed the importance of long term lifestyle changes which include nutrition, exercise and behavioral modification as well as the importance of customizing this to her specific health and social needs.  We reviewed the role of medical interventions including pharmacotherapy and surgical interventions.   We discussed the benefits of reaching a healthier weight to alleviate the symptoms of existing conditions and reduce the risks of the biomechanical, cardiometabolic and psychological effects of obesity.  We reviewed our program approach and philosophy, which are guided by the four pillars of obesity medicine.  We discussed how to prepare for intake appointment and the importance of fasting and avoidance of stimulants for at least 8 hours prior to indirect calorimetry.  Theresa [name] appears to be in the action stage of change and reports being ready to initiate intensive lifestyle and behavioral modifications as part of their weight loss journey.  Attestation  Reviewed by clinician on day of visit: allergies, medications, problem list, medical history, surgical history, family history, social history, and previous encounter notes pertinent to obesity diagnosis.  I have spent 44 minutes in the care of the patient today including: 7 minutes before the visit reviewing and preparing the  chart. 30 minutes face-to-face assessing and reviewing listed medical problems as outlined in obesity care plan, providing nutritional and behavioral counseling on topics outlined in the obesity care plan, independently interpreting test results and goals of care, as described in assessment and plan, reviewing and discussing biometric information and progress, and reviewing latest PCP notes and specialist consultations 7 minutes after the visit updating chart and documentation of encounter.  Theresa [name] ANP-C Lucas Parker, MD, ABIM, ABOM

## 2024-04-24 ENCOUNTER — Ambulatory Visit: Admitting: Internal Medicine

## 2024-04-25 ENCOUNTER — Other Ambulatory Visit (HOSPITAL_COMMUNITY)
Admission: RE | Admit: 2024-04-25 | Discharge: 2024-04-25 | Disposition: A | Discharge status: Home / Self Care | Source: Ambulatory Visit | Attending: Nurse Practitioner | Admitting: Nurse Practitioner

## 2024-04-25 ENCOUNTER — Ambulatory Visit: Admitting: Nurse Practitioner

## 2024-04-25 ENCOUNTER — Encounter: Payer: Self-pay | Admitting: Nurse Practitioner

## 2024-04-25 VITALS — BP 130/76 | HR 102 | Temp 98.6°F | Ht 64.0 in | Wt 204.8 lb

## 2024-04-25 DIAGNOSIS — Z1159 Encounter for screening for other viral diseases: Secondary | ICD-10-CM | POA: Diagnosis present

## 2024-04-25 DIAGNOSIS — N760 Acute vaginitis: Secondary | ICD-10-CM | POA: Insufficient documentation

## 2024-04-25 DIAGNOSIS — L819 Disorder of pigmentation, unspecified: Secondary | ICD-10-CM | POA: Insufficient documentation

## 2024-04-25 NOTE — Patient Instructions (Addendum)
 Go to lab  [facility] probiotics or Feminine balance probiotics or [place] Probiotics or Florastor Probiotics.

## 2024-04-25 NOTE — Progress Notes (Signed)
 Acute Office Visit  Subjective:    Patient ID: Theresa [name], female    DOB: 11/08/1990, 33 y.o.   MRN: [id number]  Chief Complaint  Patient presents with   Vaginal Discharge    Thick cottage cheese discharge no odor, itchy for 4 days  Rash on top lip on/off for a year   Vaginal Discharge The patient's primary symptoms include genital itching and vaginal discharge. The patient's pertinent negatives include no genital lesions, genital odor, genital rash, missed menses, pelvic pain or vaginal bleeding. This is a recurrent problem. The current episode started in the past 7 days. The problem occurs constantly. The problem has been unchanged. The patient is experiencing no pain. She is not pregnant. Pertinent negatives include no abdominal pain, anorexia, back pain, chills, constipation, diarrhea, discolored urine, dysuria, fever, flank pain, frequency, headaches, hematuria, joint pain, joint swelling, nausea, painful intercourse, rash, sore throat, urgency or vomiting. The vaginal discharge was thick and white. The vaginal bleeding is typical of menses. She has not been passing clots. She has not been passing tissue. Nothing aggravates the symptoms. She has tried nothing for the symptoms. She is sexually active. She uses condoms for contraception. Her menstrual history has been regular. Her past medical history is significant for vaginosis. There is no history of herpes simplex, PID or an STD.   Outpatient Medications Prior to Visit  Medication Sig   calcium carbonate (TUMS) 500 MG chewable tablet Chew 1 tablet by mouth daily as needed for indigestion or heartburn.   mometasone  (ELOCON ) 0.1 % cream Apply to affected area of labia daily for 7 days   Multiple Vitamin (MULTIVITAMIN) capsule Take 1 capsule by mouth daily.   clindamycin  (CLEOCIN ) 100 MG vaginal suppository Place 1 suppository (100 mg total) vaginally at bedtime. (Patient not taking: Reported on 04/25/2024)   [DISCONTINUED]  fluconazole  (DIFLUCAN ) 150 MG tablet Take 1 tablet by mouth once. Repeat dose in 3 days if symptoms persist. (Patient not taking: Reported on 04/25/2024)   [DISCONTINUED] hydrOXYzine (VISTARIL) 25 MG capsule Take 25 mg by mouth every 6 (six) hours as needed. (Patient not taking: Reported on 04/25/2024)   [DISCONTINUED] potassium chloride  20 MEQ TBCR Take 1 tablet (20 mEq total) by mouth daily for 5 days. (Patient not taking: Reported on 04/25/2024)   No facility-administered medications prior to visit.    Reviewed past medical and social history.  Review of Systems  Constitutional:  Negative for chills and fever.  HENT:  Negative for sore throat.   Gastrointestinal:  Negative for abdominal pain, anorexia, constipation, diarrhea, nausea and vomiting.  Genitourinary:  Positive for vaginal discharge. Negative for dysuria, flank pain, frequency, hematuria, missed menses, pelvic pain and urgency.  Musculoskeletal:  Negative for back pain and joint pain.  Skin:  Negative for rash.  Neurological:  Negative for headaches.   Per HPI     Objective:    Physical Exam Vitals and nursing note reviewed. Exam conducted with a chaperone present.  HENT:     Mouth/Throat:      Comments: Macular hyperpigmented lesion on left upper lip, no induration, no erythema, no skin retraction, not scaly, no nodule Genitourinary:    Exam position: Lithotomy position.     Labia:        Right: No tenderness or lesion.        Left: No tenderness or lesion.      Vagina: Vaginal discharge present. No erythema or tenderness.     Cervix: Discharge present. No cervical motion  tenderness, friability, lesion or erythema.     Comments: Excoriation on bilateral labia majora Neurological:     Mental Status: She is alert and oriented to person, place, and time.    BP 130/76 (BP Location: Left Arm, Patient Position: Sitting, Cuff Size: Large)   Pulse (!) 102   Temp 98.6 F (37 C) (Oral)   Ht 5' 4 (1.626 m)   Wt 204 lb  12.8 oz (92.9 kg)   LMP 03/26/2024 (Exact Date)   SpO2 99%   BMI 35.15 kg/m    No results found for any visits on 04/25/24.     Assessment & Plan:   Problem List Items Addressed This Visit     Hyperpigmented skin lesion   1st appeared 1year ago, waxing and waning, occassional scaly No erythema, no swelling, no itching.  Entered dermatology referral Advised to use facial moisturizer with suncreen daily      Relevant Orders   Ambulatory referral to Dermatology   Other Visit Diagnoses       Acute vaginitis    -  Primary   Relevant Orders   Cervicovaginal ancillary only( [place])     Encounter for screening for viral disease       Relevant Orders   Cervicovaginal ancillary only( [place])   Hepatitis C antibody   HIV Antibody (routine testing w rflx)   RPR      No orders of the defined types were placed in this encounter.  Return if symptoms worsen or fail to improve.  Roselie Mood, NP

## 2024-04-25 NOTE — Assessment & Plan Note (Signed)
 1st appeared 1year ago, waxing and waning, occassional scaly No erythema, no swelling, no itching.  Entered dermatology referral Advised to use facial moisturizer with suncreen daily

## 2024-04-28 ENCOUNTER — Other Ambulatory Visit

## 2024-04-28 ENCOUNTER — Ambulatory Visit: Payer: Self-pay | Admitting: Nurse Practitioner

## 2024-04-28 DIAGNOSIS — Z1159 Encounter for screening for other viral diseases: Secondary | ICD-10-CM | POA: Diagnosis not present

## 2024-04-28 LAB — CERVICOVAGINAL ANCILLARY ONLY
Bacterial Vaginitis (gardnerella): NEGATIVE
Candida Glabrata: NEGATIVE
Candida Vaginitis: NEGATIVE
Chlamydia: NEGATIVE
Comment: NEGATIVE
Comment: NEGATIVE
Comment: NEGATIVE
Comment: NEGATIVE
Comment: NEGATIVE
Comment: NORMAL
Neisseria Gonorrhea: NEGATIVE
Trichomonas: NEGATIVE

## 2024-04-29 LAB — HEPATITIS C ANTIBODY: Hepatitis C Ab: NONREACTIVE

## 2024-04-29 LAB — RPR: RPR Ser Ql: NONREACTIVE

## 2024-04-29 LAB — HIV ANTIBODY (ROUTINE TESTING W REFLEX)
HIV 1&2 Ab, 4th Generation: NONREACTIVE
HIV FINAL INTERPRETATION: NEGATIVE

## 2024-05-13 ENCOUNTER — Encounter: Payer: Self-pay | Admitting: Internal Medicine

## 2024-12-02 ENCOUNTER — Ambulatory Visit: Admitting: Dermatology
